# Patient Record
Sex: Female | Born: 1950
Health system: Southern US, Community
[De-identification: ages and names within clinical notes are randomized; demographics above are authoritative.]

## PROBLEM LIST (undated history)

## (undated) DIAGNOSIS — R0602 Shortness of breath: Secondary | ICD-10-CM

## (undated) DIAGNOSIS — J45909 Unspecified asthma, uncomplicated: Secondary | ICD-10-CM

## (undated) DIAGNOSIS — Z21 Asymptomatic human immunodeficiency virus [HIV] infection status: Secondary | ICD-10-CM

## (undated) DIAGNOSIS — R51 Headache: Secondary | ICD-10-CM

## (undated) DIAGNOSIS — G63 Polyneuropathy in diseases classified elsewhere: Secondary | ICD-10-CM

## (undated) DIAGNOSIS — J449 Chronic obstructive pulmonary disease, unspecified: Secondary | ICD-10-CM

## (undated) DIAGNOSIS — M797 Fibromyalgia: Secondary | ICD-10-CM

## (undated) DIAGNOSIS — F32A Depression, unspecified: Secondary | ICD-10-CM

## (undated) DIAGNOSIS — C73 Malignant neoplasm of thyroid gland: Secondary | ICD-10-CM

## (undated) DIAGNOSIS — M1990 Unspecified osteoarthritis, unspecified site: Secondary | ICD-10-CM

## (undated) DIAGNOSIS — F419 Anxiety disorder, unspecified: Secondary | ICD-10-CM

## (undated) DIAGNOSIS — J189 Pneumonia, unspecified organism: Secondary | ICD-10-CM

## (undated) DIAGNOSIS — N898 Other specified noninflammatory disorders of vagina: Secondary | ICD-10-CM

## (undated) DIAGNOSIS — F329 Major depressive disorder, single episode, unspecified: Secondary | ICD-10-CM

## (undated) DIAGNOSIS — B20 Human immunodeficiency virus [HIV] disease: Secondary | ICD-10-CM

## (undated) HISTORY — DX: Unspecified asthma, uncomplicated: J45.909

## (undated) HISTORY — PX: DILATION AND CURETTAGE OF UTERUS: SHX78

## (undated) HISTORY — DX: Other specified noninflammatory disorders of vagina: N89.8

## (undated) HISTORY — DX: Unspecified osteoarthritis, unspecified site: M19.90

## (undated) HISTORY — DX: Chronic obstructive pulmonary disease, unspecified: J44.9

## (undated) HISTORY — DX: Human immunodeficiency virus (HIV) disease: B20

## (undated) HISTORY — DX: Polyneuropathy in diseases classified elsewhere: G63

---

## 1998-03-13 ENCOUNTER — Emergency Department (HOSPITAL_COMMUNITY): Admission: EM | Admit: 1998-03-13 | Discharge: 1998-03-13 | Payer: Self-pay | Admitting: Emergency Medicine

## 1998-03-27 ENCOUNTER — Emergency Department (HOSPITAL_COMMUNITY): Admission: EM | Admit: 1998-03-27 | Discharge: 1998-03-27 | Payer: Self-pay | Admitting: Emergency Medicine

## 1998-06-29 ENCOUNTER — Encounter: Admission: RE | Admit: 1998-06-29 | Discharge: 1998-06-29 | Payer: Self-pay | Admitting: Family Medicine

## 1998-07-18 ENCOUNTER — Encounter: Admission: RE | Admit: 1998-07-18 | Discharge: 1998-07-18 | Payer: Self-pay | Admitting: Family Medicine

## 1998-09-27 ENCOUNTER — Encounter: Admission: RE | Admit: 1998-09-27 | Discharge: 1998-09-27 | Payer: Self-pay | Admitting: Family Medicine

## 1998-12-17 ENCOUNTER — Encounter: Admission: RE | Admit: 1998-12-17 | Discharge: 1998-12-17 | Payer: Self-pay | Admitting: Family Medicine

## 1999-02-22 ENCOUNTER — Encounter: Payer: Self-pay | Admitting: Emergency Medicine

## 1999-02-22 ENCOUNTER — Emergency Department (HOSPITAL_COMMUNITY): Admission: EM | Admit: 1999-02-22 | Discharge: 1999-02-22 | Payer: Self-pay | Admitting: Emergency Medicine

## 1999-08-23 ENCOUNTER — Emergency Department (HOSPITAL_COMMUNITY): Admission: EM | Admit: 1999-08-23 | Discharge: 1999-08-23 | Payer: Self-pay | Admitting: Emergency Medicine

## 1999-08-23 ENCOUNTER — Encounter: Payer: Self-pay | Admitting: Emergency Medicine

## 2000-05-19 ENCOUNTER — Emergency Department (HOSPITAL_COMMUNITY): Admission: EM | Admit: 2000-05-19 | Discharge: 2000-05-19 | Payer: Self-pay | Admitting: Emergency Medicine

## 2000-05-19 ENCOUNTER — Encounter: Payer: Self-pay | Admitting: Emergency Medicine

## 2000-10-10 ENCOUNTER — Emergency Department (HOSPITAL_COMMUNITY): Admission: EM | Admit: 2000-10-10 | Discharge: 2000-10-11 | Payer: Self-pay | Admitting: Emergency Medicine

## 2000-10-10 ENCOUNTER — Encounter: Payer: Self-pay | Admitting: Emergency Medicine

## 2000-10-14 ENCOUNTER — Encounter: Admission: RE | Admit: 2000-10-14 | Discharge: 2000-10-14 | Payer: Self-pay | Admitting: Family Medicine

## 2001-02-23 ENCOUNTER — Emergency Department (HOSPITAL_COMMUNITY): Admission: EM | Admit: 2001-02-23 | Discharge: 2001-02-23 | Payer: Self-pay | Admitting: Emergency Medicine

## 2001-02-23 ENCOUNTER — Encounter: Payer: Self-pay | Admitting: Emergency Medicine

## 2002-02-13 ENCOUNTER — Emergency Department (HOSPITAL_COMMUNITY): Admission: EM | Admit: 2002-02-13 | Discharge: 2002-02-13 | Payer: Self-pay | Admitting: Emergency Medicine

## 2002-02-13 ENCOUNTER — Encounter: Payer: Self-pay | Admitting: Emergency Medicine

## 2002-02-18 ENCOUNTER — Emergency Department (HOSPITAL_COMMUNITY): Admission: EM | Admit: 2002-02-18 | Discharge: 2002-02-18 | Payer: Self-pay

## 2002-02-23 ENCOUNTER — Encounter: Admission: RE | Admit: 2002-02-23 | Discharge: 2002-03-14 | Payer: Self-pay | Admitting: *Deleted

## 2002-11-01 ENCOUNTER — Emergency Department (HOSPITAL_COMMUNITY): Admission: EM | Admit: 2002-11-01 | Discharge: 2002-11-01 | Payer: Self-pay | Admitting: *Deleted

## 2002-11-11 ENCOUNTER — Emergency Department (HOSPITAL_COMMUNITY): Admission: EM | Admit: 2002-11-11 | Discharge: 2002-11-11 | Payer: Self-pay | Admitting: Emergency Medicine

## 2003-01-04 ENCOUNTER — Other Ambulatory Visit: Admission: RE | Admit: 2003-01-04 | Discharge: 2003-01-04 | Payer: Self-pay | Admitting: Family Medicine

## 2003-01-04 ENCOUNTER — Encounter: Admission: RE | Admit: 2003-01-04 | Discharge: 2003-01-04 | Payer: Self-pay | Admitting: Family Medicine

## 2003-01-06 ENCOUNTER — Encounter: Admission: RE | Admit: 2003-01-06 | Discharge: 2003-01-06 | Payer: Self-pay | Admitting: Family Medicine

## 2003-01-06 ENCOUNTER — Encounter: Payer: Self-pay | Admitting: Sports Medicine

## 2003-01-12 ENCOUNTER — Encounter: Payer: Self-pay | Admitting: Sports Medicine

## 2003-01-12 ENCOUNTER — Encounter: Admission: RE | Admit: 2003-01-12 | Discharge: 2003-01-12 | Payer: Self-pay | Admitting: Sports Medicine

## 2003-02-20 ENCOUNTER — Emergency Department (HOSPITAL_COMMUNITY): Admission: EM | Admit: 2003-02-20 | Discharge: 2003-02-20 | Payer: Self-pay | Admitting: *Deleted

## 2003-03-22 ENCOUNTER — Emergency Department (HOSPITAL_COMMUNITY): Admission: EM | Admit: 2003-03-22 | Discharge: 2003-03-22 | Payer: Self-pay | Admitting: Emergency Medicine

## 2003-04-16 ENCOUNTER — Emergency Department (HOSPITAL_COMMUNITY): Admission: EM | Admit: 2003-04-16 | Discharge: 2003-04-16 | Payer: Self-pay | Admitting: Emergency Medicine

## 2003-04-18 ENCOUNTER — Encounter: Admission: RE | Admit: 2003-04-18 | Discharge: 2003-04-18 | Payer: Self-pay | Admitting: Family Medicine

## 2003-05-02 ENCOUNTER — Encounter: Admission: RE | Admit: 2003-05-02 | Discharge: 2003-05-02 | Payer: Self-pay | Admitting: Family Medicine

## 2003-06-28 ENCOUNTER — Emergency Department (HOSPITAL_COMMUNITY): Admission: EM | Admit: 2003-06-28 | Discharge: 2003-06-28 | Payer: Self-pay | Admitting: Emergency Medicine

## 2003-07-14 ENCOUNTER — Emergency Department (HOSPITAL_COMMUNITY): Admission: EM | Admit: 2003-07-14 | Discharge: 2003-07-14 | Payer: Self-pay | Admitting: Emergency Medicine

## 2004-01-01 ENCOUNTER — Encounter: Admission: RE | Admit: 2004-01-01 | Discharge: 2004-01-01 | Payer: Self-pay | Admitting: Family Medicine

## 2004-01-01 ENCOUNTER — Emergency Department (HOSPITAL_COMMUNITY): Admission: EM | Admit: 2004-01-01 | Discharge: 2004-01-01 | Payer: Self-pay | Admitting: Emergency Medicine

## 2004-01-17 ENCOUNTER — Emergency Department (HOSPITAL_COMMUNITY): Admission: EM | Admit: 2004-01-17 | Discharge: 2004-01-17 | Payer: Self-pay | Admitting: *Deleted

## 2004-01-22 ENCOUNTER — Emergency Department (HOSPITAL_COMMUNITY): Admission: EM | Admit: 2004-01-22 | Discharge: 2004-01-22 | Payer: Self-pay | Admitting: Emergency Medicine

## 2004-04-29 ENCOUNTER — Other Ambulatory Visit: Admission: RE | Admit: 2004-04-29 | Discharge: 2004-04-29 | Payer: Self-pay | Admitting: Family Medicine

## 2004-04-29 ENCOUNTER — Encounter: Admission: RE | Admit: 2004-04-29 | Discharge: 2004-04-29 | Payer: Self-pay | Admitting: Family Medicine

## 2004-05-13 ENCOUNTER — Encounter: Admission: RE | Admit: 2004-05-13 | Discharge: 2004-05-13 | Payer: Self-pay | Admitting: Sports Medicine

## 2004-05-15 ENCOUNTER — Encounter: Admission: RE | Admit: 2004-05-15 | Discharge: 2004-05-15 | Payer: Self-pay | Admitting: Sports Medicine

## 2004-05-29 ENCOUNTER — Encounter: Admission: RE | Admit: 2004-05-29 | Discharge: 2004-05-29 | Payer: Self-pay | Admitting: Family Medicine

## 2004-06-03 ENCOUNTER — Emergency Department (HOSPITAL_COMMUNITY): Admission: EM | Admit: 2004-06-03 | Discharge: 2004-06-03 | Payer: Self-pay | Admitting: Emergency Medicine

## 2004-06-17 ENCOUNTER — Encounter: Admission: RE | Admit: 2004-06-17 | Discharge: 2004-06-17 | Payer: Self-pay | Admitting: Family Medicine

## 2004-07-28 ENCOUNTER — Emergency Department (HOSPITAL_COMMUNITY): Admission: EM | Admit: 2004-07-28 | Discharge: 2004-07-28 | Payer: Self-pay | Admitting: Emergency Medicine

## 2004-08-19 ENCOUNTER — Ambulatory Visit: Payer: Self-pay | Admitting: Family Medicine

## 2005-02-14 ENCOUNTER — Ambulatory Visit: Payer: Self-pay | Admitting: Family Medicine

## 2005-05-31 ENCOUNTER — Encounter (INDEPENDENT_AMBULATORY_CARE_PROVIDER_SITE_OTHER): Payer: Self-pay | Admitting: *Deleted

## 2005-06-13 ENCOUNTER — Other Ambulatory Visit: Admission: RE | Admit: 2005-06-13 | Discharge: 2005-06-13 | Payer: Self-pay | Admitting: Family Medicine

## 2005-06-13 ENCOUNTER — Ambulatory Visit: Payer: Self-pay | Admitting: Family Medicine

## 2005-08-11 ENCOUNTER — Emergency Department (HOSPITAL_COMMUNITY): Admission: EM | Admit: 2005-08-11 | Discharge: 2005-08-11 | Payer: Self-pay | Admitting: Family Medicine

## 2005-08-25 ENCOUNTER — Emergency Department (HOSPITAL_COMMUNITY): Admission: EM | Admit: 2005-08-25 | Discharge: 2005-08-26 | Payer: Self-pay | Admitting: Emergency Medicine

## 2005-09-04 ENCOUNTER — Emergency Department (HOSPITAL_COMMUNITY): Admission: EM | Admit: 2005-09-04 | Discharge: 2005-09-04 | Payer: Self-pay | Admitting: Family Medicine

## 2005-09-09 ENCOUNTER — Emergency Department (HOSPITAL_COMMUNITY): Admission: EM | Admit: 2005-09-09 | Discharge: 2005-09-09 | Payer: Self-pay | Admitting: Emergency Medicine

## 2005-09-29 ENCOUNTER — Ambulatory Visit: Payer: Self-pay | Admitting: Sports Medicine

## 2006-03-25 ENCOUNTER — Emergency Department (HOSPITAL_COMMUNITY): Admission: EM | Admit: 2006-03-25 | Discharge: 2006-03-25 | Payer: Self-pay | Admitting: Emergency Medicine

## 2006-04-10 ENCOUNTER — Emergency Department (HOSPITAL_COMMUNITY): Admission: EM | Admit: 2006-04-10 | Discharge: 2006-04-11 | Payer: Self-pay | Admitting: Emergency Medicine

## 2006-04-20 ENCOUNTER — Ambulatory Visit: Payer: Self-pay | Admitting: Family Medicine

## 2006-09-08 ENCOUNTER — Ambulatory Visit: Payer: Self-pay | Admitting: Family Medicine

## 2006-09-11 ENCOUNTER — Ambulatory Visit (HOSPITAL_COMMUNITY): Admission: RE | Admit: 2006-09-11 | Discharge: 2006-09-11 | Payer: Self-pay | Admitting: Family Medicine

## 2006-12-30 ENCOUNTER — Ambulatory Visit: Payer: Self-pay | Admitting: Family Medicine

## 2007-01-28 DIAGNOSIS — F339 Major depressive disorder, recurrent, unspecified: Secondary | ICD-10-CM | POA: Insufficient documentation

## 2007-01-28 DIAGNOSIS — J410 Simple chronic bronchitis: Secondary | ICD-10-CM

## 2007-01-28 DIAGNOSIS — G44209 Tension-type headache, unspecified, not intractable: Secondary | ICD-10-CM

## 2007-01-28 DIAGNOSIS — M76899 Other specified enthesopathies of unspecified lower limb, excluding foot: Secondary | ICD-10-CM

## 2007-01-28 DIAGNOSIS — F411 Generalized anxiety disorder: Secondary | ICD-10-CM

## 2007-01-28 DIAGNOSIS — N951 Menopausal and female climacteric states: Secondary | ICD-10-CM

## 2007-01-28 DIAGNOSIS — D179 Benign lipomatous neoplasm, unspecified: Secondary | ICD-10-CM | POA: Insufficient documentation

## 2007-01-28 DIAGNOSIS — R209 Unspecified disturbances of skin sensation: Secondary | ICD-10-CM | POA: Insufficient documentation

## 2007-01-29 ENCOUNTER — Encounter (INDEPENDENT_AMBULATORY_CARE_PROVIDER_SITE_OTHER): Payer: Self-pay | Admitting: *Deleted

## 2007-02-25 ENCOUNTER — Ambulatory Visit: Payer: Self-pay | Admitting: Family Medicine

## 2007-02-25 ENCOUNTER — Encounter (INDEPENDENT_AMBULATORY_CARE_PROVIDER_SITE_OTHER): Payer: Self-pay | Admitting: Family Medicine

## 2007-03-03 ENCOUNTER — Ambulatory Visit: Payer: Self-pay | Admitting: Family Medicine

## 2007-03-10 ENCOUNTER — Ambulatory Visit: Payer: Self-pay | Admitting: Family Medicine

## 2007-04-02 ENCOUNTER — Emergency Department (HOSPITAL_COMMUNITY): Admission: EM | Admit: 2007-04-02 | Discharge: 2007-04-02 | Payer: Self-pay | Admitting: Emergency Medicine

## 2007-04-15 ENCOUNTER — Ambulatory Visit: Payer: Self-pay | Admitting: Family Medicine

## 2007-05-21 ENCOUNTER — Emergency Department (HOSPITAL_COMMUNITY): Admission: EM | Admit: 2007-05-21 | Discharge: 2007-05-21 | Payer: Self-pay | Admitting: Emergency Medicine

## 2007-05-24 ENCOUNTER — Ambulatory Visit: Payer: Self-pay | Admitting: Family Medicine

## 2007-05-24 DIAGNOSIS — M25569 Pain in unspecified knee: Secondary | ICD-10-CM

## 2007-06-09 ENCOUNTER — Ambulatory Visit: Payer: Self-pay | Admitting: Sports Medicine

## 2007-06-09 DIAGNOSIS — M9320 Osteochondritis dissecans of unspecified site: Secondary | ICD-10-CM

## 2007-07-06 ENCOUNTER — Emergency Department (HOSPITAL_COMMUNITY): Admission: EM | Admit: 2007-07-06 | Discharge: 2007-07-06 | Payer: Self-pay | Admitting: Emergency Medicine

## 2007-07-15 ENCOUNTER — Telehealth: Payer: Self-pay | Admitting: Family Medicine

## 2007-07-21 ENCOUNTER — Ambulatory Visit: Payer: Self-pay | Admitting: Sports Medicine

## 2007-07-26 ENCOUNTER — Telehealth (INDEPENDENT_AMBULATORY_CARE_PROVIDER_SITE_OTHER): Payer: Self-pay | Admitting: *Deleted

## 2007-07-27 ENCOUNTER — Encounter (INDEPENDENT_AMBULATORY_CARE_PROVIDER_SITE_OTHER): Payer: Self-pay | Admitting: *Deleted

## 2007-09-13 ENCOUNTER — Emergency Department (HOSPITAL_COMMUNITY): Admission: EM | Admit: 2007-09-13 | Discharge: 2007-09-13 | Payer: Self-pay | Admitting: Emergency Medicine

## 2007-09-30 ENCOUNTER — Emergency Department (HOSPITAL_COMMUNITY): Admission: EM | Admit: 2007-09-30 | Discharge: 2007-09-30 | Payer: Self-pay | Admitting: Emergency Medicine

## 2007-10-01 ENCOUNTER — Ambulatory Visit: Payer: Self-pay | Admitting: Sports Medicine

## 2007-10-01 DIAGNOSIS — M2340 Loose body in knee, unspecified knee: Secondary | ICD-10-CM

## 2007-10-01 DIAGNOSIS — S93409A Sprain of unspecified ligament of unspecified ankle, initial encounter: Secondary | ICD-10-CM | POA: Insufficient documentation

## 2007-10-01 DIAGNOSIS — S92919A Unspecified fracture of unspecified toe(s), initial encounter for closed fracture: Secondary | ICD-10-CM | POA: Insufficient documentation

## 2007-10-06 ENCOUNTER — Telehealth: Payer: Self-pay | Admitting: Sports Medicine

## 2007-10-22 ENCOUNTER — Telehealth (INDEPENDENT_AMBULATORY_CARE_PROVIDER_SITE_OTHER): Payer: Self-pay | Admitting: *Deleted

## 2007-12-07 ENCOUNTER — Telehealth: Payer: Self-pay | Admitting: *Deleted

## 2007-12-09 ENCOUNTER — Ambulatory Visit: Payer: Self-pay | Admitting: Family Medicine

## 2007-12-15 ENCOUNTER — Ambulatory Visit: Payer: Self-pay | Admitting: Sports Medicine

## 2007-12-27 ENCOUNTER — Emergency Department (HOSPITAL_COMMUNITY): Admission: EM | Admit: 2007-12-27 | Discharge: 2007-12-27 | Payer: Self-pay | Admitting: Emergency Medicine

## 2008-04-26 ENCOUNTER — Emergency Department (HOSPITAL_COMMUNITY): Admission: EM | Admit: 2008-04-26 | Discharge: 2008-04-26 | Payer: Self-pay | Admitting: Emergency Medicine

## 2008-06-27 ENCOUNTER — Emergency Department (HOSPITAL_COMMUNITY): Admission: EM | Admit: 2008-06-27 | Discharge: 2008-06-27 | Payer: Self-pay | Admitting: Emergency Medicine

## 2008-08-18 ENCOUNTER — Emergency Department (HOSPITAL_COMMUNITY): Admission: EM | Admit: 2008-08-18 | Discharge: 2008-08-18 | Payer: Self-pay | Admitting: Emergency Medicine

## 2008-11-03 ENCOUNTER — Emergency Department (HOSPITAL_COMMUNITY): Admission: EM | Admit: 2008-11-03 | Discharge: 2008-11-03 | Payer: Self-pay | Admitting: Emergency Medicine

## 2008-12-13 ENCOUNTER — Emergency Department (HOSPITAL_COMMUNITY): Admission: EM | Admit: 2008-12-13 | Discharge: 2008-12-13 | Payer: Self-pay | Admitting: Emergency Medicine

## 2009-03-01 DEATH — deceased

## 2009-05-04 ENCOUNTER — Emergency Department (HOSPITAL_COMMUNITY): Admission: EM | Admit: 2009-05-04 | Discharge: 2009-05-04 | Payer: Self-pay | Admitting: Family Medicine

## 2009-05-31 HISTORY — PX: ANTERIOR CERVICAL DECOMP/DISCECTOMY FUSION: SHX1161

## 2009-06-12 ENCOUNTER — Emergency Department (HOSPITAL_COMMUNITY): Admission: EM | Admit: 2009-06-12 | Discharge: 2009-06-12 | Payer: Self-pay | Admitting: Emergency Medicine

## 2009-06-18 ENCOUNTER — Inpatient Hospital Stay (HOSPITAL_COMMUNITY): Admission: RE | Admit: 2009-06-18 | Discharge: 2009-06-19 | Payer: Self-pay | Admitting: Neurosurgery

## 2009-09-14 ENCOUNTER — Emergency Department (HOSPITAL_COMMUNITY): Admission: EM | Admit: 2009-09-14 | Discharge: 2009-09-14 | Payer: Self-pay | Admitting: Emergency Medicine

## 2009-12-02 ENCOUNTER — Emergency Department (HOSPITAL_COMMUNITY): Admission: EM | Admit: 2009-12-02 | Discharge: 2009-12-02 | Payer: Self-pay | Admitting: Emergency Medicine

## 2010-05-05 ENCOUNTER — Encounter: Admission: RE | Admit: 2010-05-05 | Discharge: 2010-05-05 | Payer: Self-pay | Admitting: Neurosurgery

## 2010-10-21 ENCOUNTER — Emergency Department (HOSPITAL_COMMUNITY): Admission: EM | Admit: 2010-10-21 | Discharge: 2010-10-21 | Payer: Self-pay | Admitting: Emergency Medicine

## 2010-10-31 ENCOUNTER — Emergency Department (HOSPITAL_COMMUNITY)
Admission: EM | Admit: 2010-10-31 | Discharge: 2010-10-31 | Payer: Self-pay | Source: Home / Self Care | Admitting: Emergency Medicine

## 2011-01-04 ENCOUNTER — Encounter: Payer: Self-pay | Admitting: *Deleted

## 2011-02-11 LAB — URINE MICROSCOPIC-ADD ON

## 2011-02-11 LAB — WET PREP, GENITAL: Clue Cells Wet Prep HPF POC: NONE SEEN

## 2011-02-11 LAB — URINALYSIS, ROUTINE W REFLEX MICROSCOPIC
Glucose, UA: NEGATIVE mg/dL
Hgb urine dipstick: NEGATIVE
Specific Gravity, Urine: 1.03 (ref 1.005–1.030)

## 2011-02-15 LAB — WET PREP, GENITAL: Yeast Wet Prep HPF POC: NONE SEEN

## 2011-02-15 LAB — URINE MICROSCOPIC-ADD ON

## 2011-02-15 LAB — URINALYSIS, ROUTINE W REFLEX MICROSCOPIC
Nitrite: NEGATIVE
Specific Gravity, Urine: 1.03 (ref 1.005–1.030)
Urobilinogen, UA: 1 mg/dL (ref 0.0–1.0)

## 2011-03-06 LAB — URINE MICROSCOPIC-ADD ON

## 2011-03-06 LAB — URINALYSIS, ROUTINE W REFLEX MICROSCOPIC
Glucose, UA: NEGATIVE mg/dL
Ketones, ur: NEGATIVE mg/dL
Protein, ur: 100 mg/dL — AB

## 2011-03-09 LAB — CBC
HCT: 41.2 % (ref 36.0–46.0)
Hemoglobin: 14.3 g/dL (ref 12.0–15.0)
RBC: 4.55 MIL/uL (ref 3.87–5.11)
RDW: 13.1 % (ref 11.5–15.5)
WBC: 6.7 10*3/uL (ref 4.0–10.5)

## 2011-03-09 LAB — URINALYSIS, ROUTINE W REFLEX MICROSCOPIC
Bilirubin Urine: NEGATIVE
Glucose, UA: NEGATIVE mg/dL
Specific Gravity, Urine: 1.011 (ref 1.005–1.030)
pH: 7.5 (ref 5.0–8.0)

## 2011-03-09 LAB — URINE MICROSCOPIC-ADD ON

## 2011-03-17 ENCOUNTER — Emergency Department (HOSPITAL_COMMUNITY)
Admission: EM | Admit: 2011-03-17 | Discharge: 2011-03-17 | Disposition: A | Discharge status: Home / Self Care | Payer: Medicaid Other | Attending: Emergency Medicine | Admitting: Emergency Medicine

## 2011-03-17 DIAGNOSIS — J4489 Other specified chronic obstructive pulmonary disease: Secondary | ICD-10-CM | POA: Insufficient documentation

## 2011-03-17 DIAGNOSIS — L2989 Other pruritus: Secondary | ICD-10-CM | POA: Insufficient documentation

## 2011-03-17 DIAGNOSIS — M79609 Pain in unspecified limb: Secondary | ICD-10-CM | POA: Insufficient documentation

## 2011-03-17 DIAGNOSIS — L298 Other pruritus: Secondary | ICD-10-CM | POA: Insufficient documentation

## 2011-03-17 DIAGNOSIS — T370X5A Adverse effect of sulfonamides, initial encounter: Secondary | ICD-10-CM | POA: Insufficient documentation

## 2011-03-17 DIAGNOSIS — J438 Other emphysema: Secondary | ICD-10-CM | POA: Insufficient documentation

## 2011-03-17 DIAGNOSIS — J449 Chronic obstructive pulmonary disease, unspecified: Secondary | ICD-10-CM | POA: Insufficient documentation

## 2011-04-15 NOTE — Op Note (Signed)
[name], [name]                ACCOUNT NO.:  [id number]   MEDICAL RECORD NO.:  [id number]          PATIENT TYPE:  INP   LOCATION:  3524                         FACILITY:  [facility]   PHYSICIAN:  [name], M.D.  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   PROCEDURE:  Anterior cervical decompression diskectomy fusion at C3-4  and C5-6 with LifeNet allograft bone and Trestle anterior cervical plate  x2.   SURGEON:  Rachael Fake, MD   ASSISTANT:  Rachael Lias, MD   ANESTHESIA:  General endotracheal tube anesthesia.   ESTIMATED BLOOD LOSS:  Minimal.   BLOOD GIVEN:  None.   DRAINS:  None.   COMPLICATIONS:  None.   INDICATIONS FOR PROCEDURE:  The patient is a 60 year old woman who has  leg clumsiness, tingling, and pain.  MRI of cervical spine was done  showing cord compression and cord change due to stenosis and disk  herniation at C3-4 and some spondylitic change and stenosis also at C5-  6, but no cord change there.  The patient was brought in for  decompression and fusion.   PROCEDURE IN DETAIL:  The patient was brought to the operating room and  general anesthesia was induced.  The patient was placed in 10-pound  Halter traction, prepped and draped in sterile fashion.  Site of  incision injected with 10 mL of 1% lidocaine with epinephrine.  Incision  was then made from the midline to the anterior border of the  sternocleidomastoid muscle on the left side.  Neck incision taken down  platysma and hemostasis obtained with Bovie cauterization.  The platysma  muscle was incised with a Bovie and blunt dissection taken through the  anterior cervical fascia at the anterior cervical spine.  Needle was  placed in 2 interspaces.  X-ray was obtained showing the needles were at  the C4, 5, 6, 7 levels.  We then removed the needles and exposed.  We  went higher and placed the needles at 3-4 and 5-6 levels.  Took another  x-ray and this confirmed our positioning.  At the 3-4 level, the needle  was removed.  Disk space was incised with a 15 blade and partial  diskectomy was started with pituitary rongeurs.  We then placed self-  retaining retractor.  We placed distraction pins in the C3 and C4 and  distracted the interspace.  Microscope was brought in for  microdissection.  We continued diskectomy with curettes and then 1 or 2-  mm Kerrison punches were removed in posterior disk osteophytes ligament  decompressing the central canal and performing bilateral foraminotomies.  When we were finished, we had good decompression of central canal and  bilateral nerve roots exited well.  We used high-speed drill to remove  cartilaginous endplate.  Used sizer to measure height  of disk space,  measured to be 5 mm and good hemostasis with Gelfoam and thrombin.  This  was irrigated out and then a 5-mm LifeNet allograft bone was tapped into  place, countersunk couple millimeters.  Distraction pins were removed.  Hemostasis obtained with Gelfoam and thrombin.  A Trestle anterior  cervical plate was placed over the 3-4 disk space and 2 screws were  placed at 3-4 and these were tightened down.  Retractor was then moved  down to the 5-6 level.  Disk space was incised with 15-blade and  diskectomy was started with pituitary rongeurs.  Anterior osteophytes  removed with Leksell rongeurs and distraction pins placed in the C5 and  6 and the interspace distracted.  Microscope was moved down, so I could  look at the 5-6 disk and diskectomy continued with curettes and 1 or 2-  mm Kerrison punch was removed from posterior disk osteophyte ligament   decompressing the central canal and performing bilateral foraminotomies.  When we were finished, we had good central and lateral decompression.  We used high-speed drill to remove cartilaginous endplate.  We measured  height of the disk space to be 5 mm.  A 5-mm LifeNet allograft bone was  tapped into place.  Distraction pins were removed, weight was removed  from the traction, bone plug was firmly in good position, and a Trestle  anterior cervical plate was placed over the anterior cervical spine, 2  screws were placed at C5-C6.  These were tightened down, lateral x-rays  were obtained showing good position of plate, screws, bone plugs at 3-4  and 5-6 levels.  Retractor was removed.  Hemostasis was obtained with  bipolar cauterization, Gelfoam, and thrombin.  Gelfoam was irrigated  out.  We had good hemostasis.  The platysma was closed with 3-0 Vicryl  interrupted sutures.  Subcutaneous tissue closed with same.  Skin closed  with benzoin and Steri-Strips.  Dressing was placed.  The patient was  placed in a soft cervical collar, awoken from anesthesia, and  transferred to recovery room in stable condition.           ______________________________  [name], M.D.     JRH/MEDQ  D:  06/18/2009  T:  06/19/2009  Job:  [id number]

## 2011-05-26 ENCOUNTER — Emergency Department (HOSPITAL_COMMUNITY)
Admission: EM | Admit: 2011-05-26 | Discharge: 2011-05-26 | Disposition: A | Discharge status: Home / Self Care | Payer: Medicaid Other | Attending: Emergency Medicine | Admitting: Emergency Medicine

## 2011-05-26 ENCOUNTER — Emergency Department (HOSPITAL_COMMUNITY): Payer: Medicaid Other

## 2011-05-26 DIAGNOSIS — IMO0002 Reserved for concepts with insufficient information to code with codable children: Secondary | ICD-10-CM | POA: Insufficient documentation

## 2011-05-26 DIAGNOSIS — S61409A Unspecified open wound of unspecified hand, initial encounter: Secondary | ICD-10-CM | POA: Insufficient documentation

## 2011-05-26 DIAGNOSIS — J45909 Unspecified asthma, uncomplicated: Secondary | ICD-10-CM | POA: Insufficient documentation

## 2011-05-26 DIAGNOSIS — T148XXA Other injury of unspecified body region, initial encounter: Secondary | ICD-10-CM | POA: Insufficient documentation

## 2011-05-26 DIAGNOSIS — M25559 Pain in unspecified hip: Secondary | ICD-10-CM | POA: Insufficient documentation

## 2011-05-26 DIAGNOSIS — M25529 Pain in unspecified elbow: Secondary | ICD-10-CM | POA: Insufficient documentation

## 2011-05-26 DIAGNOSIS — M79609 Pain in unspecified limb: Secondary | ICD-10-CM | POA: Insufficient documentation

## 2011-05-26 DIAGNOSIS — Z7982 Long term (current) use of aspirin: Secondary | ICD-10-CM | POA: Insufficient documentation

## 2011-05-26 DIAGNOSIS — M25569 Pain in unspecified knee: Secondary | ICD-10-CM | POA: Insufficient documentation

## 2011-08-22 ENCOUNTER — Ambulatory Visit (HOSPITAL_COMMUNITY)
Admission: RE | Admit: 2011-08-22 | Discharge: 2011-08-22 | Disposition: A | Discharge status: Home / Self Care | Payer: Medicaid Other | Source: Ambulatory Visit | Attending: Family Medicine | Admitting: Family Medicine

## 2011-08-22 ENCOUNTER — Other Ambulatory Visit (HOSPITAL_COMMUNITY): Payer: Self-pay | Admitting: Family Medicine

## 2011-08-22 DIAGNOSIS — R52 Pain, unspecified: Secondary | ICD-10-CM

## 2011-08-22 DIAGNOSIS — M4802 Spinal stenosis, cervical region: Secondary | ICD-10-CM | POA: Insufficient documentation

## 2011-08-22 DIAGNOSIS — M542 Cervicalgia: Secondary | ICD-10-CM | POA: Insufficient documentation

## 2011-08-23 ENCOUNTER — Inpatient Hospital Stay (INDEPENDENT_AMBULATORY_CARE_PROVIDER_SITE_OTHER)
Admission: RE | Admit: 2011-08-23 | Discharge: 2011-08-23 | Disposition: A | Discharge status: Home / Self Care | Payer: Medicaid Other | Source: Ambulatory Visit | Attending: Family Medicine | Admitting: Family Medicine

## 2011-08-23 DIAGNOSIS — L299 Pruritus, unspecified: Secondary | ICD-10-CM

## 2011-08-23 DIAGNOSIS — M542 Cervicalgia: Secondary | ICD-10-CM

## 2011-08-23 LAB — POCT I-STAT, CHEM 8
Calcium, Ion: 1.16 mmol/L (ref 1.12–1.32)
Chloride: 102 mEq/L (ref 96–112)
HCT: 41 % (ref 36.0–46.0)
Potassium: 3.8 mEq/L (ref 3.5–5.1)
Sodium: 139 mEq/L (ref 135–145)

## 2011-08-27 LAB — CULTURE, ROUTINE-ABSCESS
Culture: NO GROWTH
Gram Stain: NONE SEEN

## 2011-09-01 LAB — URINE MICROSCOPIC-ADD ON

## 2011-09-01 LAB — URINALYSIS, ROUTINE W REFLEX MICROSCOPIC
Bilirubin Urine: NEGATIVE
Hgb urine dipstick: NEGATIVE
Specific Gravity, Urine: 1.009
Urobilinogen, UA: 1

## 2011-12-15 ENCOUNTER — Encounter (INDEPENDENT_AMBULATORY_CARE_PROVIDER_SITE_OTHER): Payer: Self-pay | Admitting: Surgery

## 2011-12-15 ENCOUNTER — Ambulatory Visit (INDEPENDENT_AMBULATORY_CARE_PROVIDER_SITE_OTHER): Payer: Medicaid Other | Admitting: Surgery

## 2011-12-15 VITALS — BP 98/68 | HR 76 | Temp 97.0°F | Resp 20 | Ht 68.5 in | Wt 151.6 lb

## 2011-12-15 DIAGNOSIS — R591 Generalized enlarged lymph nodes: Secondary | ICD-10-CM

## 2011-12-15 DIAGNOSIS — R599 Enlarged lymph nodes, unspecified: Secondary | ICD-10-CM

## 2011-12-15 LAB — LACTATE DEHYDROGENASE: LDH: 183 U/L (ref 94–250)

## 2011-12-15 NOTE — Patient Instructions (Signed)
Lymphadenopathy Lymphadenopathy means "disease of the lymph glands." But the term is usually used to describe swollen or enlarged lymph glands, also called lymph nodes. These are the bean-shaped organs found in many locations including the neck, underarm, and groin. Lymph glands are part of the immune system, which fights infections in your body. Lymphadenopathy can occur in just one area of the body, such as the neck, or it can be generalized, with lymph node enlargement in several areas. The nodes found in the neck are the most common sites of lymphadenopathy. CAUSES  When your immune system responds to germs (such as viruses or bacteria ), infection-fighting cells and fluid build up. This causes the glands to grow in size. This is usually not something to worry about. Sometimes, the glands themselves can become infected and inflamed. This is called lymphadenitis. Enlarged lymph nodes can be caused by many diseases:  Bacterial disease, such as strep throat or a skin infection.   Viral disease, such as a common cold.   Other germs, such as lyme disease, tuberculosis, or sexually transmitted diseases.   Cancers, such as lymphoma (cancer of the lymphatic system) or leukemia (cancer of the white blood cells).   Inflammatory diseases such as lupus or rheumatoid arthritis.   Reactions to medications.  Many of the diseases above are rare, but important. This is why you should see your caregiver if you have lymphadenopathy. SYMPTOMS   Swollen, enlarged lumps in the neck, back of the head or other locations.   Tenderness.   Warmth or redness of the skin over the lymph nodes.   Fever.  DIAGNOSIS  Enlarged lymph nodes are often near the source of infection. They can help healthcare providers diagnose your illness. For instance:   Swollen lymph nodes around the jaw might be caused by an infection in the mouth.   Enlarged glands in the neck often signal a throat infection.   Lymph nodes that  are swollen in more than one area often indicate an illness caused by a virus.  Your caregiver most likely will know what is causing your lymphadenopathy after listening to your history and examining you. Blood tests, x-rays or other tests may be needed. If the cause of the enlarged lymph node cannot be found, and it does not go away by itself, then a biopsy may be needed. Your caregiver will discuss this with you. TREATMENT  Treatment for your enlarged lymph nodes will depend on the cause. Many times the nodes will shrink to normal size by themselves, with no treatment. Antibiotics or other medicines may be needed for infection. Only take over-the-counter or prescription medicines for pain, discomfort or fever as directed by your caregiver. HOME CARE INSTRUCTIONS  Swollen lymph glands usually return to normal when the underlying medical condition goes away. If they persist, contact your health-care provider. He/she might prescribe antibiotics or other treatments, depending on the diagnosis. Take any medications exactly as prescribed. Keep any follow-up appointments made to check on the condition of your enlarged nodes.  SEEK MEDICAL CARE IF:   Swelling lasts for more than two weeks.   You have symptoms such as weight loss, night sweats, fatigue or fever that does not go away.   The lymph nodes are hard, seem fixed to the skin or are growing rapidly.   Skin over the lymph nodes is red and inflamed. This could mean there is an infection.  SEEK IMMEDIATE MEDICAL CARE IF:   Fluid starts leaking from the area of the   enlarged lymph node.   You develop a fever of 102 F (38.9 C) or greater.   Severe pain develops (not necessarily at the site of a large lymph node).   You develop chest pain or shortness of breath.   You develop worsening abdominal pain.  MAKE SURE YOU:   Understand these instructions.   Will watch your condition.   Will get help right away if you are not doing well or get  worse.  Document Released: 08/26/2008 Document Revised: 07/30/2011 Document Reviewed: 08/26/2008 Exit[employer] Patient Information 2012 [employer]. 

## 2011-12-15 NOTE — Progress Notes (Signed)
Patient ID: [name], female   DOB: Sep 27, 1951, 61 y.o.   MRN: [id number]  Chief Complaint  Patient presents with  . Other    new pt- eval lipoma on neck and wrist    HPI [name] is a 61 y.o. female.   HPIThe patient presents at the request of Dr. Katrinka Blazing do to nodules around her neck. These were detected on physical exam recently. She is uncertain how long they have been there. Patient does not relate any significant pain from these areas. She had spinal fusion surgery 2 years ago. She does have some numbness of both hands from this. She is a smoker. She denies any IV drug use or known exposure for HIV or hepatitis. No significant weight loss. The nodules are located in her neck region and/or enlarging  Past Medical History  Diagnosis Date  . Arthritis   . COPD (chronic obstructive pulmonary disease)   . Lipoma     Past Surgical History  Procedure Date  . Spine surgery 2010    Family History  Problem Relation Age of Onset  . Heart disease Maternal Uncle     Social History History  Substance Use Topics  . Smoking status: Current Everyday Smoker -- 0.5 packs/day  . Smokeless tobacco: Not on file  . Alcohol Use: No    Allergies  Allergen Reactions  . Sulfa Antibiotics     Current Outpatient Prescriptions  Medication Sig Dispense Refill  . DULoxetine (CYMBALTA) 60 MG capsule Take 60 mg by mouth daily.      Marland Kitchen oxyCODONE-acetaminophen (PERCOCET) 10-325 MG per tablet Take 1 tablet by mouth every 4 (four) hours as needed.        Review of Systems Review of Systems  Constitutional: Positive for fatigue. Negative for unexpected weight change.  HENT: Positive for neck stiffness.   Eyes: Negative.   Respiratory: Positive for cough.   Cardiovascular: Negative.   Gastrointestinal: Negative.   Genitourinary: Negative.   Neurological: Negative.   Hematological: Negative.   Psychiatric/Behavioral: Negative.     Blood pressure 98/68, pulse 76, temperature 97 F  (36.1 C), temperature source Temporal, resp. rate 20, height 5' 8.5" (1.74 m), weight 151 lb 9.6 oz (68.765 kg).  Physical Exam Physical Exam  Constitutional: She is oriented to person, place, and time. She appears well-developed and well-nourished.  HENT:  Head: Normocephalic and atraumatic.  Eyes: EOM are normal. Pupils are equal, round, and reactive to light. No scleral icterus.  Neck: No tracheal deviation present. No thyromegaly present.    Cardiovascular: Normal rate, regular rhythm and intact distal pulses.   Pulmonary/Chest: No accessory muscle usage or stridor. Not tachypneic. She is in respiratory distress.  Abdominal: There is hepatosplenomegaly and splenomegaly. There is no rebound and no CVA tenderness.  Musculoskeletal: Normal range of motion.       Arms: Lymphadenopathy:    She has cervical adenopathy.       Right cervical: Deep cervical and posterior cervical adenopathy present.       Left cervical: Deep cervical and posterior cervical adenopathy present.    She has axillary adenopathy.       Left axillary: Lateral adenopathy present.       Right: No inguinal adenopathy present.       Left: No inguinal adenopathy present.  Neurological: She is alert and oriented to person, place, and time.  Skin: Skin is warm, dry and intact.  Psychiatric: She has a normal mood and affect. Her speech  is normal and behavior is normal.    Data Reviewed Office note  Assessment    Cervical and left axillary adenopathy with hepatosplenomegaly  Right wrist ganglion cyst    Plan    CT scan of the neck chest and abdomen for lymphadenopathy and history of tobacco abuse.  Orthopedic consult for ganglion cyst       Nakia Koble A. 12/15/2011, 10:41 AM

## 2011-12-18 ENCOUNTER — Other Ambulatory Visit (INDEPENDENT_AMBULATORY_CARE_PROVIDER_SITE_OTHER): Payer: Self-pay | Admitting: General Surgery

## 2011-12-18 DIAGNOSIS — R591 Generalized enlarged lymph nodes: Secondary | ICD-10-CM

## 2011-12-19 ENCOUNTER — Ambulatory Visit
Admission: RE | Admit: 2011-12-19 | Discharge: 2011-12-19 | Disposition: A | Discharge status: Home / Self Care | Payer: Medicaid Other | Source: Ambulatory Visit | Attending: Surgery | Admitting: Surgery

## 2011-12-19 ENCOUNTER — Other Ambulatory Visit (INDEPENDENT_AMBULATORY_CARE_PROVIDER_SITE_OTHER): Payer: Self-pay | Admitting: Surgery

## 2011-12-19 ENCOUNTER — Other Ambulatory Visit: Payer: Medicaid Other

## 2011-12-19 DIAGNOSIS — R591 Generalized enlarged lymph nodes: Secondary | ICD-10-CM

## 2011-12-22 ENCOUNTER — Other Ambulatory Visit (INDEPENDENT_AMBULATORY_CARE_PROVIDER_SITE_OTHER): Payer: Self-pay | Admitting: General Surgery

## 2011-12-22 DIAGNOSIS — E041 Nontoxic single thyroid nodule: Secondary | ICD-10-CM

## 2011-12-23 ENCOUNTER — Other Ambulatory Visit (INDEPENDENT_AMBULATORY_CARE_PROVIDER_SITE_OTHER): Payer: Self-pay | Admitting: Surgery

## 2011-12-23 ENCOUNTER — Telehealth (INDEPENDENT_AMBULATORY_CARE_PROVIDER_SITE_OTHER): Payer: Self-pay | Admitting: General Surgery

## 2011-12-23 ENCOUNTER — Other Ambulatory Visit (INDEPENDENT_AMBULATORY_CARE_PROVIDER_SITE_OTHER): Payer: Self-pay | Admitting: General Surgery

## 2011-12-23 DIAGNOSIS — E041 Nontoxic single thyroid nodule: Secondary | ICD-10-CM

## 2011-12-23 NOTE — Telephone Encounter (Signed)
MS Malmquist CALLED TO FIND OUT WHAT THE PROCEDURE WAS FOR THE FNA GUIDED THYROID BX/ I EXPLAINED THE CT RESULTS THAT SHOWED A NODULE IN THYROID AND THE NEXT STEP WAS TO HAVE THE NODULE BIOPSIED. THE RESULTS MIGHT HELP FIND OUT WHAT IS CAUSING HER LYMPH NODES TO BE ENLARGED. SHE WILL PROCEED WITH THE SCHEDULED BX/GY

## 2011-12-30 ENCOUNTER — Ambulatory Visit
Admission: RE | Admit: 2011-12-30 | Discharge: 2011-12-30 | Disposition: A | Discharge status: Home / Self Care | Payer: Medicaid Other | Source: Ambulatory Visit | Attending: Surgery | Admitting: Surgery

## 2011-12-30 ENCOUNTER — Other Ambulatory Visit (HOSPITAL_COMMUNITY)
Admission: RE | Admit: 2011-12-30 | Discharge: 2011-12-30 | Disposition: A | Discharge status: Home / Self Care | Payer: Medicaid Other | Source: Ambulatory Visit | Attending: Interventional Radiology | Admitting: Interventional Radiology

## 2011-12-30 DIAGNOSIS — E041 Nontoxic single thyroid nodule: Secondary | ICD-10-CM | POA: Insufficient documentation

## 2011-12-30 DIAGNOSIS — R6889 Other general symptoms and signs: Secondary | ICD-10-CM | POA: Insufficient documentation

## 2012-01-05 ENCOUNTER — Encounter (INDEPENDENT_AMBULATORY_CARE_PROVIDER_SITE_OTHER): Payer: Medicaid Other | Admitting: Surgery

## 2012-01-05 ENCOUNTER — Other Ambulatory Visit (INDEPENDENT_AMBULATORY_CARE_PROVIDER_SITE_OTHER): Payer: Self-pay | Admitting: General Surgery

## 2012-01-05 DIAGNOSIS — C73 Malignant neoplasm of thyroid gland: Secondary | ICD-10-CM

## 2012-01-16 ENCOUNTER — Other Ambulatory Visit: Payer: Self-pay | Admitting: Otolaryngology

## 2012-01-16 ENCOUNTER — Other Ambulatory Visit (HOSPITAL_COMMUNITY)
Admission: RE | Admit: 2012-01-16 | Discharge: 2012-01-16 | Disposition: A | Discharge status: Home / Self Care | Payer: Medicaid Other | Source: Ambulatory Visit | Attending: Otolaryngology | Admitting: Otolaryngology

## 2012-01-16 DIAGNOSIS — R221 Localized swelling, mass and lump, neck: Secondary | ICD-10-CM | POA: Insufficient documentation

## 2012-01-16 DIAGNOSIS — R220 Localized swelling, mass and lump, head: Secondary | ICD-10-CM | POA: Insufficient documentation

## 2012-01-22 ENCOUNTER — Encounter (HOSPITAL_COMMUNITY): Payer: Self-pay | Admitting: Pharmacy Technician

## 2012-01-27 ENCOUNTER — Other Ambulatory Visit (HOSPITAL_COMMUNITY): Payer: Self-pay | Admitting: Otolaryngology

## 2012-01-28 ENCOUNTER — Inpatient Hospital Stay (HOSPITAL_COMMUNITY): Admission: RE | Admit: 2012-01-28 | Discharge: 2012-01-28 | Payer: Medicaid Other | Source: Ambulatory Visit

## 2012-01-30 ENCOUNTER — Encounter (HOSPITAL_COMMUNITY): Admission: RE | Payer: Self-pay | Source: Ambulatory Visit

## 2012-01-30 ENCOUNTER — Ambulatory Visit (HOSPITAL_COMMUNITY): Admission: RE | Admit: 2012-01-30 | Payer: Medicaid Other | Source: Ambulatory Visit | Admitting: Otolaryngology

## 2012-01-30 SURGERY — THYROIDECTOMY
Anesthesia: General | Laterality: Bilateral

## 2012-02-05 ENCOUNTER — Ambulatory Visit (INDEPENDENT_AMBULATORY_CARE_PROVIDER_SITE_OTHER): Payer: Medicaid Other

## 2012-02-05 DIAGNOSIS — G622 Polyneuropathy due to other toxic agents: Secondary | ICD-10-CM

## 2012-02-05 DIAGNOSIS — B20 Human immunodeficiency virus [HIV] disease: Secondary | ICD-10-CM

## 2012-02-05 DIAGNOSIS — Z21 Asymptomatic human immunodeficiency virus [HIV] infection status: Secondary | ICD-10-CM

## 2012-02-05 DIAGNOSIS — Z23 Encounter for immunization: Secondary | ICD-10-CM

## 2012-02-05 DIAGNOSIS — C73 Malignant neoplasm of thyroid gland: Secondary | ICD-10-CM

## 2012-02-05 LAB — COMPLETE METABOLIC PANEL WITH GFR
Alkaline Phosphatase: 66 U/L (ref 39–117)
Creat: 1.13 mg/dL — ABNORMAL HIGH (ref 0.50–1.10)
GFR, Est Non African American: 53 mL/min — ABNORMAL LOW
Glucose, Bld: 89 mg/dL (ref 70–99)
Sodium: 138 mEq/L (ref 135–145)
Total Bilirubin: 0.3 mg/dL (ref 0.3–1.2)
Total Protein: 7.1 g/dL (ref 6.0–8.3)

## 2012-02-05 LAB — HEPATITIS B SURFACE ANTIGEN: Hepatitis B Surface Ag: NEGATIVE

## 2012-02-05 LAB — HEPATITIS B SURFACE ANTIBODY,QUALITATIVE: Hep B S Ab: POSITIVE — AB

## 2012-02-06 DIAGNOSIS — C73 Malignant neoplasm of thyroid gland: Secondary | ICD-10-CM | POA: Insufficient documentation

## 2012-02-06 DIAGNOSIS — B20 Human immunodeficiency virus [HIV] disease: Secondary | ICD-10-CM | POA: Insufficient documentation

## 2012-02-06 HISTORY — DX: Malignant neoplasm of thyroid gland: C73

## 2012-02-06 LAB — LIPID PANEL
HDL: 30 mg/dL — ABNORMAL LOW (ref >39)
LDL Cholesterol: 68 mg/dL (ref 0–99)
Total CHOL/HDL Ratio: 5.5 Ratio
VLDL: 66 mg/dL — ABNORMAL HIGH (ref 0–40)

## 2012-02-06 LAB — CBC WITH DIFFERENTIAL/PLATELET
Basophils Relative: 2 % — ABNORMAL HIGH (ref 0–1)
Eosinophils Absolute: 0.1 10*3/uL (ref 0.0–0.7)
Hemoglobin: 12.3 g/dL (ref 12.0–15.0)
MCH: 28.4 pg (ref 26.0–34.0)
MCHC: 32.6 g/dL (ref 30.0–36.0)
Monocytes Relative: 14 % — ABNORMAL HIGH (ref 3–12)
Neutrophils Relative %: 43 % (ref 43–77)
Platelets: 232 10*3/uL (ref 150–400)

## 2012-02-06 LAB — RPR

## 2012-02-06 LAB — URINALYSIS
Bilirubin Urine: NEGATIVE
Glucose, UA: NEGATIVE mg/dL
Hgb urine dipstick: NEGATIVE
Ketones, ur: NEGATIVE mg/dL
Protein, ur: NEGATIVE mg/dL

## 2012-02-06 LAB — T-HELPER CELL (CD4) - (RCID CLINIC ONLY): CD4 % Helper T Cell: 32 % — ABNORMAL LOW (ref 33–55)

## 2012-02-06 LAB — HEPATITIS B CORE ANTIBODY, TOTAL: Hep B Core Total Ab: POSITIVE — AB
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
## 2012-02-06 NOTE — Patient Instructions (Signed)
Pt is to keep office visit scheduled with Dr Daiva Eves on 02-19-12.   Advised to [facility] [place]
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
## 2012-02-09 DIAGNOSIS — Z23 Encounter for immunization: Secondary | ICD-10-CM

## 2012-02-09 LAB — HIV-1 RNA ULTRAQUANT REFLEX TO GENTYP+
HIV 1 RNA Quant: 359658 copies/mL — ABNORMAL HIGH (ref <20)
HIV-1 RNA Quant, Log: 5.56 {Log} — ABNORMAL HIGH (ref <1.30)

## 2012-02-12 LAB — HIV-1 GENOTYPR PLUS
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
## 2012-02-16 ENCOUNTER — Other Ambulatory Visit: Payer: Self-pay | Admitting: Family Medicine

## 2012-02-19 ENCOUNTER — Encounter: Payer: Self-pay | Admitting: Infectious Disease

## 2012-02-19 ENCOUNTER — Ambulatory Visit (INDEPENDENT_AMBULATORY_CARE_PROVIDER_SITE_OTHER): Payer: Medicaid Other | Admitting: Infectious Disease

## 2012-02-19 VITALS — BP 127/84 | HR 75 | Temp 98.2°F | Ht 68.0 in | Wt 152.0 lb

## 2012-02-19 DIAGNOSIS — Z21 Asymptomatic human immunodeficiency virus [HIV] infection status: Secondary | ICD-10-CM

## 2012-02-19 DIAGNOSIS — F339 Major depressive disorder, recurrent, unspecified: Secondary | ICD-10-CM

## 2012-02-19 DIAGNOSIS — B20 Human immunodeficiency virus [HIV] disease: Secondary | ICD-10-CM

## 2012-02-19 DIAGNOSIS — C73 Malignant neoplasm of thyroid gland: Secondary | ICD-10-CM

## 2012-02-19 MED ORDER — ATAZANAVIR SULFATE 300 MG PO CAPS: 300.0000 mg | ORAL_CAPSULE | Freq: Every day | ORAL | Status: DC

## 2012-02-19 MED ORDER — EMTRICITABINE-TENOFOVIR DF 200-300 MG PO TABS: 1.0000 | ORAL_TABLET | Freq: Every day | ORAL | Status: DC

## 2012-02-19 MED ORDER — RITONAVIR 100 MG PO TABS: 100.0000 mg | ORAL_TABLET | Freq: Every day | ORAL | Status: DC

## 2012-02-19 NOTE — Assessment & Plan Note (Signed)
Continue the cymbalta referring to [facility] counselor

## 2012-02-19 NOTE — Progress Notes (Signed)
Subjective:    Patient ID: [name], female    DOB: 20-Sep-1951, 61 y.o.   MRN: [id number]  HPI  61 year old [place] American lady who is newly diagnosed HIV with very high viral load but healthy cd4 count. She has chronic pain and was also found to have thyroid cancer by ENT. They tested her for HIV and are going to pursue surgery. The patient is informing me that the surgery is being delayed, but I do not know if this is genuinely the case or if there are specific endpoints that her surgeons are looking for. Certainly in terms of her HIV she has a high CD4 count so she is not immunocompromised from HIV at this point. We reviewed DHHS guidelines and I decided to put her on boosted reyataz with truvada. Will bring back for repeat CD4 and VL in one month.  She apparently hs problems with chronic pain and I wonder about substance abuse. She was voicing homicidal ideation with re to her former boyfriend at intake but now denies having such plans and denies SI. She is on cymbalta. We have referred to [facility] counselor. She is contracted for safety.  I spent greater than 60 minutes with the patient including greater than 50% of time in face to face counsel of the patient and in coordination of their care.     Review of Systems  Constitutional: Negative for fever, chills, diaphoresis, activity change, appetite change, fatigue and unexpected weight change.  HENT: Negative for congestion, sore throat, rhinorrhea, sneezing, trouble swallowing and sinus pressure.   Eyes: Negative for photophobia and visual disturbance.  Respiratory: Negative for cough, chest tightness, shortness of breath, wheezing and stridor.   Cardiovascular: Negative for chest pain, palpitations and leg swelling.  Gastrointestinal: Negative for nausea, vomiting, abdominal pain, diarrhea, constipation, blood in stool, abdominal distention and anal bleeding.  Genitourinary: Negative for dysuria, hematuria, flank pain and difficulty  urinating.  Musculoskeletal: Positive for back pain and arthralgias. Negative for myalgias, joint swelling and gait problem.  Skin: Negative for color change, pallor, rash and wound.  Neurological: Negative for dizziness, tremors, weakness and light-headedness.  Hematological: Negative for adenopathy. Does not bruise/bleed easily.  Psychiatric/Behavioral: Negative for behavioral problems, confusion, sleep disturbance, dysphoric mood, decreased concentration and agitation.       Objective:   Physical Exam  Constitutional: She is oriented to person, place, and time. She appears well-developed and well-nourished. No distress.  HENT:  Head: Normocephalic and atraumatic.  Mouth/Throat: Oropharynx is clear and moist. No oropharyngeal exudate.  Eyes: Conjunctivae and EOM are normal. Pupils are equal, round, and reactive to light. No scleral icterus.  Neck: Normal range of motion. Neck supple. No JVD present.  Cardiovascular: Normal rate, regular rhythm and normal heart sounds.  Exam reveals no gallop and no friction rub.   No murmur heard. Pulmonary/Chest: Effort normal and breath sounds normal. No respiratory distress. She has no wheezes. She has no rales. She exhibits no tenderness.  Abdominal: She exhibits no distension and no mass. There is no tenderness. There is no rebound and no guarding.  Musculoskeletal: She exhibits no edema and no tenderness.       Arms: Lymphadenopathy:    She has no cervical adenopathy.  Neurological: She is alert and oriented to person, place, and time. She has normal reflexes. She exhibits normal muscle tone. Coordination normal.  Skin: Skin is warm and dry. She is not diaphoretic. No erythema. No pallor.  Psychiatric: Her behavior is normal.  Judgment and thought content normal. Her mood appears anxious.          Assessment & Plan:  HIV (human immunodeficiency virus infection) Start reyataz novir and truvada  DEPRESSION, MAJOR, RECURRENT Continue the  cymbalta referring to [facility] counselor  Thyroid cancer Needs resection. I will send this note to ENT. I do NOT see reason for delay in surgery given that pt has a healthy CD4 count. Her virus is not yet suppressed but should be within next two months, but this would not put her at risk in OR

## 2012-02-19 NOTE — Assessment & Plan Note (Signed)
Start reyataz novir and truvada

## 2012-02-19 NOTE — Assessment & Plan Note (Signed)
Needs resection. I will send this note to ENT. I do NOT see reason for delay in surgery given that pt has a healthy CD4 count. Her virus is not yet suppressed but should be within next two months, but this would not put her at risk in OR

## 2012-03-09 ENCOUNTER — Encounter (HOSPITAL_COMMUNITY)
Admission: RE | Admit: 2012-03-09 | Discharge: 2012-03-09 | Disposition: A | Discharge status: Home / Self Care | Payer: Medicaid Other | Source: Ambulatory Visit | Attending: Otolaryngology | Admitting: Otolaryngology

## 2012-03-09 ENCOUNTER — Encounter (HOSPITAL_COMMUNITY): Payer: Self-pay

## 2012-03-09 HISTORY — DX: Headache: R51

## 2012-03-09 HISTORY — DX: Anxiety disorder, unspecified: F41.9

## 2012-03-09 HISTORY — DX: Major depressive disorder, single episode, unspecified: F32.9

## 2012-03-09 HISTORY — DX: Depression, unspecified: F32.A

## 2012-03-09 LAB — BASIC METABOLIC PANEL
BUN: 15 mg/dL (ref 6–23)
Chloride: 103 mEq/L (ref 96–112)
GFR calc Af Amer: 59 mL/min — ABNORMAL LOW (ref >90)
Potassium: 3.9 mEq/L (ref 3.5–5.1)

## 2012-03-09 LAB — SURGICAL PCR SCREEN
MRSA, PCR: NEGATIVE
Staphylococcus aureus: NEGATIVE

## 2012-03-09 LAB — CBC
HCT: 36.9 % (ref 36.0–46.0)
Hemoglobin: 12.6 g/dL (ref 12.0–15.0)
MCHC: 34.1 g/dL (ref 30.0–36.0)

## 2012-03-09 NOTE — Pre-Procedure Instructions (Addendum)
20 ARMENTA ERSKIN  03/09/2012   Your procedure is scheduled on:  03/11/12  Report to [facility] at730* AM.  Call this number if you have problems the morning of surgery: [phone]   Remember:   Do not eat food:After Midnight.  May have clear liquids: up to 4 Hours before arrival. 330am  Clear liquids include soda, tea, black coffee, apple or grape juice, broth.  Take these medicines the morning of surgery with A SIP OF WATER:cymbalta, antiviral meds if take in am STOP meloxicam   Do not wear jewelry, make-up or nail polish.  Do not wear lotions, powders, or perfumes. You may wear deodorant.  Do not shave 48 hours prior to surgery.  Do not bring valuables to the hospital.  Contacts, dentures or bridgework may not be worn into surgery.  Leave suitcase in the car. After surgery it may be brought to your room.  For patients admitted to the hospital, checkout time is 11:00 AM the day of discharge.   Patients discharged the day of surgery will not be allowed to drive home.  Name and phone number of your driver: floyd blackwell [phone]  Special Instructions: CHG Shower Use Special Wash: 1/2 bottle night before surgery and 1/2 bottle morning of surgery.   Please read over the following fact sheets that you were given: Pain Booklet, Coughing and Deep Breathing, MRSA Information and Surgical Site Infection Prevention

## 2012-03-11 ENCOUNTER — Ambulatory Visit (HOSPITAL_COMMUNITY)
Admission: RE | Admit: 2012-03-11 | Discharge: 2012-03-12 | Disposition: A | Discharge status: Home / Self Care | Payer: Medicaid Other | Source: Ambulatory Visit | Attending: Otolaryngology | Admitting: Otolaryngology

## 2012-03-11 ENCOUNTER — Ambulatory Visit (HOSPITAL_COMMUNITY): Payer: Medicaid Other | Admitting: Anesthesiology

## 2012-03-11 ENCOUNTER — Encounter (HOSPITAL_COMMUNITY): Payer: Self-pay | Admitting: Anesthesiology

## 2012-03-11 ENCOUNTER — Encounter (HOSPITAL_COMMUNITY)
Admission: RE | Disposition: A | Discharge status: Home / Self Care | Payer: Self-pay | Source: Ambulatory Visit | Attending: Otolaryngology

## 2012-03-11 ENCOUNTER — Encounter (HOSPITAL_COMMUNITY): Payer: Self-pay | Admitting: *Deleted

## 2012-03-11 ENCOUNTER — Encounter (HOSPITAL_COMMUNITY): Payer: Self-pay | Admitting: General Practice

## 2012-03-11 DIAGNOSIS — F411 Generalized anxiety disorder: Secondary | ICD-10-CM | POA: Insufficient documentation

## 2012-03-11 DIAGNOSIS — C73 Malignant neoplasm of thyroid gland: Secondary | ICD-10-CM

## 2012-03-11 DIAGNOSIS — J449 Chronic obstructive pulmonary disease, unspecified: Secondary | ICD-10-CM | POA: Insufficient documentation

## 2012-03-11 DIAGNOSIS — Z21 Asymptomatic human immunodeficiency virus [HIV] infection status: Secondary | ICD-10-CM | POA: Insufficient documentation

## 2012-03-11 DIAGNOSIS — R51 Headache: Secondary | ICD-10-CM | POA: Insufficient documentation

## 2012-03-11 DIAGNOSIS — B20 Human immunodeficiency virus [HIV] disease: Secondary | ICD-10-CM

## 2012-03-11 DIAGNOSIS — Z01812 Encounter for preprocedural laboratory examination: Secondary | ICD-10-CM | POA: Insufficient documentation

## 2012-03-11 DIAGNOSIS — J4489 Other specified chronic obstructive pulmonary disease: Secondary | ICD-10-CM | POA: Insufficient documentation

## 2012-03-11 HISTORY — DX: Asymptomatic human immunodeficiency virus (hiv) infection status: Z21

## 2012-03-11 HISTORY — DX: Malignant neoplasm of thyroid gland: C73

## 2012-03-11 HISTORY — DX: Shortness of breath: R06.02

## 2012-03-11 HISTORY — PX: THYROIDECTOMY: SHX17

## 2012-03-11 HISTORY — DX: Human immunodeficiency virus (HIV) disease: B20

## 2012-03-11 HISTORY — DX: Pneumonia, unspecified organism: J18.9

## 2012-03-11 HISTORY — DX: Fibromyalgia: M79.7

## 2012-03-11 SURGERY — THYROIDECTOMY
Anesthesia: General | Site: Neck | Laterality: Bilateral | Wound class: Clean

## 2012-03-11 MED ORDER — ONDANSETRON HCL 4 MG/2ML IJ SOLN
INTRAMUSCULAR | Status: DC | PRN
  Administered 2012-03-11: 4 mg via INTRAVENOUS

## 2012-03-11 MED ORDER — SUCCINYLCHOLINE CHLORIDE 20 MG/ML IJ SOLN
INTRAMUSCULAR | Status: DC | PRN
  Administered 2012-03-11: 100 mg via INTRAVENOUS

## 2012-03-11 MED ORDER — MORPHINE SULFATE 2 MG/ML IJ SOLN: 0.0500 mg/kg | INTRAMUSCULAR | Status: DC | PRN

## 2012-03-11 MED ORDER — OXYCODONE-ACETAMINOPHEN 5-325 MG PO TABS
1.0000 | ORAL_TABLET | ORAL | Status: DC | PRN
  Administered 2012-03-11 – 2012-03-12 (×3): 1 via ORAL
  Filled 2012-03-11 (×3): qty 1

## 2012-03-11 MED ORDER — SUFENTANIL CITRATE 50 MCG/ML IV SOLN
INTRAVENOUS | Status: DC | PRN
  Administered 2012-03-11: 20 ug via INTRAVENOUS
  Administered 2012-03-11: 40 ug via INTRAVENOUS
  Administered 2012-03-11: 20 ug via INTRAVENOUS
  Administered 2012-03-11: 10 ug via INTRAVENOUS

## 2012-03-11 MED ORDER — LACTATED RINGERS IV SOLN
INTRAVENOUS | Status: DC
  Administered 2012-03-11: 09:00:00 via INTRAVENOUS

## 2012-03-11 MED ORDER — PNEUMOCOCCAL VAC POLYVALENT 25 MCG/0.5ML IJ INJ
0.5000 mL | INJECTION | INTRAMUSCULAR | Status: DC
  Filled 2012-03-11: qty 0.5

## 2012-03-11 MED ORDER — MIDAZOLAM HCL 5 MG/5ML IJ SOLN
INTRAMUSCULAR | Status: DC | PRN
  Administered 2012-03-11: 2 mg via INTRAVENOUS

## 2012-03-11 MED ORDER — ONDANSETRON HCL 4 MG/2ML IJ SOLN
4.0000 mg | INTRAMUSCULAR | Status: DC | PRN
  Filled 2012-03-11: qty 2

## 2012-03-11 MED ORDER — EMTRICITABINE-TENOFOVIR DF 200-300 MG PO TABS
1.0000 | ORAL_TABLET | Freq: Every day | ORAL | Status: DC
  Administered 2012-03-11 – 2012-03-12 (×2): 1 via ORAL
  Filled 2012-03-11 (×2): qty 1

## 2012-03-11 MED ORDER — MORPHINE SULFATE 4 MG/ML IJ SOLN
2.0000 mg | INTRAMUSCULAR | Status: DC | PRN
  Administered 2012-03-11 (×2): 4 mg via INTRAVENOUS
  Filled 2012-03-11 (×2): qty 1

## 2012-03-11 MED ORDER — PHENYLEPHRINE HCL 10 MG/ML IJ SOLN
INTRAMUSCULAR | Status: DC | PRN
  Administered 2012-03-11 (×2): 40 ug via INTRAVENOUS
  Administered 2012-03-11: 80 ug via INTRAVENOUS
  Administered 2012-03-11 (×4): 40 ug via INTRAVENOUS

## 2012-03-11 MED ORDER — ATAZANAVIR SULFATE 150 MG PO CAPS
300.0000 mg | ORAL_CAPSULE | Freq: Every day | ORAL | Status: DC
  Administered 2012-03-12: 300 mg via ORAL
  Filled 2012-03-11 (×2): qty 2

## 2012-03-11 MED ORDER — RITONAVIR 100 MG PO TABS
100.0000 mg | ORAL_TABLET | Freq: Every day | ORAL | Status: DC
  Administered 2012-03-11 – 2012-03-12 (×2): 100 mg via ORAL
  Filled 2012-03-11 (×2): qty 1

## 2012-03-11 MED ORDER — HYDROMORPHONE HCL PF 1 MG/ML IJ SOLN
0.2500 mg | INTRAMUSCULAR | Status: DC | PRN
  Administered 2012-03-11 (×2): 0.5 mg via INTRAVENOUS

## 2012-03-11 MED ORDER — EPHEDRINE SULFATE 50 MG/ML IJ SOLN
INTRAMUSCULAR | Status: DC | PRN
  Administered 2012-03-11: 10 mg via INTRAVENOUS

## 2012-03-11 MED ORDER — LACTATED RINGERS IV SOLN
INTRAVENOUS | Status: DC | PRN
  Administered 2012-03-11: 09:00:00 via INTRAVENOUS

## 2012-03-11 MED ORDER — MELOXICAM 15 MG PO TABS
15.0000 mg | ORAL_TABLET | Freq: Every day | ORAL | Status: DC
  Administered 2012-03-11 – 2012-03-12 (×2): 15 mg via ORAL
  Filled 2012-03-11 (×2): qty 1

## 2012-03-11 MED ORDER — ONDANSETRON HCL 4 MG PO TABS: 4.0000 mg | ORAL_TABLET | ORAL | Status: DC | PRN

## 2012-03-11 MED ORDER — 0.9 % SODIUM CHLORIDE (POUR BTL) OPTIME
TOPICAL | Status: DC | PRN
  Administered 2012-03-11: 1000 mL

## 2012-03-11 MED ORDER — DEXTROSE-NACL 5-0.45 % IV SOLN
INTRAVENOUS | Status: DC
  Administered 2012-03-11 – 2012-03-12 (×3): via INTRAVENOUS

## 2012-03-11 MED ORDER — DULOXETINE HCL 60 MG PO CPEP
60.0000 mg | ORAL_CAPSULE | Freq: Every day | ORAL | Status: DC
  Administered 2012-03-11 – 2012-03-12 (×2): 60 mg via ORAL
  Filled 2012-03-11 (×2): qty 1

## 2012-03-11 MED ORDER — PROPOFOL 10 MG/ML IV EMUL
INTRAVENOUS | Status: DC | PRN
  Administered 2012-03-11 (×2): 100 mg via INTRAVENOUS

## 2012-03-11 SURGICAL SUPPLY — 66 items
APPLIER CLIP 9.375 SM OPEN (CLIP)
ATTRACTOMAT 16X20 MAGNETIC DRP (DRAPES) IMPLANT
BLADE SURG 15 STRL LF DISP TIS (BLADE) IMPLANT
BLADE SURG 15 STRL SS (BLADE)
BLADE SURG ROTATE 9660 (MISCELLANEOUS) IMPLANT
CANISTER SUCTION 2500CC (MISCELLANEOUS) ×2 IMPLANT
CLEANER TIP ELECTROSURG 2X2 (MISCELLANEOUS) ×2 IMPLANT
CLIP APPLIE 9.375 SM OPEN (CLIP) IMPLANT
CLOTH BEACON ORANGE TIMEOUT ST (SAFETY) ×2 IMPLANT
CONT SPEC 4OZ CLIKSEAL STRL BL (MISCELLANEOUS) ×4 IMPLANT
CORDS BIPOLAR (ELECTRODE) ×2 IMPLANT
COVER SURGICAL LIGHT HANDLE (MISCELLANEOUS) ×2 IMPLANT
CRADLE DONUT ADULT HEAD (MISCELLANEOUS) ×2 IMPLANT
DERMABOND ADVANCED (GAUZE/BANDAGES/DRESSINGS) ×1
DERMABOND ADVANCED .7 DNX12 (GAUZE/BANDAGES/DRESSINGS) ×1 IMPLANT
DRAIN CHANNEL 15F RND FF W/TCR (WOUND CARE) IMPLANT
DRAIN HEMOVAC 1/8 X 5 (WOUND CARE) IMPLANT
DRAIN JACKSON RD 7FR 3/32 (WOUND CARE) ×2 IMPLANT
ELECT COATED BLADE 2.86 ST (ELECTRODE) ×2 IMPLANT
ELECT REM PT RETURN 9FT ADLT (ELECTROSURGICAL) ×2
ELECTRODE REM PT RTRN 9FT ADLT (ELECTROSURGICAL) ×1 IMPLANT
EVACUATOR SILICONE 100CC (DRAIN) ×2 IMPLANT
GAUZE SPONGE 4X4 16PLY XRAY LF (GAUZE/BANDAGES/DRESSINGS) ×2 IMPLANT
GLOVE BIO SURGEON STRL SZ7 (GLOVE) ×2 IMPLANT
GLOVE BIO SURGEON STRL SZ7.5 (GLOVE) ×2 IMPLANT
GLOVE BIO SURGEON STRL SZ8 (GLOVE) ×2 IMPLANT
GLOVE BIOGEL PI IND STRL 6.5 (GLOVE) ×2 IMPLANT
GLOVE BIOGEL PI IND STRL 7.0 (GLOVE) ×1 IMPLANT
GLOVE BIOGEL PI IND STRL 7.5 (GLOVE) ×1 IMPLANT
GLOVE BIOGEL PI IND STRL 8 (GLOVE) ×2 IMPLANT
GLOVE BIOGEL PI INDICATOR 6.5 (GLOVE) ×2
GLOVE BIOGEL PI INDICATOR 7.0 (GLOVE) ×1
GLOVE BIOGEL PI INDICATOR 7.5 (GLOVE) ×1
GLOVE BIOGEL PI INDICATOR 8 (GLOVE) ×2
GLOVE ECLIPSE 7.5 STRL STRAW (GLOVE) ×6 IMPLANT
GLOVE SURG SS PI 6.5 STRL IVOR (GLOVE) ×4 IMPLANT
GLOVE SURG SS PI 7.0 STRL IVOR (GLOVE) ×2 IMPLANT
GOWN PREVENTION PLUS XLARGE (GOWN DISPOSABLE) ×2 IMPLANT
GOWN STRL NON-REIN LRG LVL3 (GOWN DISPOSABLE) ×10 IMPLANT
KIT BASIN OR (CUSTOM PROCEDURE TRAY) ×2 IMPLANT
KIT ROOM TURNOVER OR (KITS) ×2 IMPLANT
LOCATOR NERVE 3 VOLT (DISPOSABLE) IMPLANT
NS IRRIG 1000ML POUR BTL (IV SOLUTION) ×2 IMPLANT
PAD ARMBOARD 7.5X6 YLW CONV (MISCELLANEOUS) ×2 IMPLANT
PENCIL FOOT CONTROL (ELECTRODE) ×2 IMPLANT
PROBE NERVBE PRASS .33 (MISCELLANEOUS) ×2 IMPLANT
SHEARS HARMONIC 9CM CVD (BLADE) ×2 IMPLANT
SPONGE INTESTINAL PEANUT (DISPOSABLE) ×2 IMPLANT
STAPLER VISISTAT 35W (STAPLE) ×2 IMPLANT
SUT CHROMIC 4 0 PS 2 18 (SUTURE) ×6 IMPLANT
SUT ETHILON 3 0 PS 1 (SUTURE) ×2 IMPLANT
SUT SILK 2 0 (SUTURE) ×1
SUT SILK 2-0 18XBRD TIE 12 (SUTURE) ×1 IMPLANT
SUT SILK 3 0 (SUTURE) ×1
SUT SILK 3-0 18XBRD TIE 12 (SUTURE) ×1 IMPLANT
SUT SILK 4 0 (SUTURE) ×1
SUT SILK 4-0 18XBRD TIE 12 (SUTURE) ×1 IMPLANT
TOWEL OR 17X24 6PK STRL BLUE (TOWEL DISPOSABLE) ×2 IMPLANT
TOWEL OR 17X26 10 PK STRL BLUE (TOWEL DISPOSABLE) ×2 IMPLANT
TRAY ENT MC OR (CUSTOM PROCEDURE TRAY) ×2 IMPLANT
TRAY FOLEY CATH 14FRSI W/METER (CATHETERS) IMPLANT
TUBE ENDOTRAC EMG 7X10.2 (MISCELLANEOUS) ×2 IMPLANT
TUBE ENDOTRAC EMG 8X11.3 (MISCELLANEOUS) IMPLANT
TUBE ENDOTRACH  EMG 6MMTUBE EN (MISCELLANEOUS)
TUBE ENDOTRACH EMG 6MMTUBE EN (MISCELLANEOUS) IMPLANT
WATER STERILE IRR 1000ML POUR (IV SOLUTION) IMPLANT

## 2012-03-11 NOTE — Op Note (Signed)
Preop/postop diagnosis: Right thyroid papillary carcinoma Procedure: Total thyroidectomy with central node dissection Anesthesia: Gen. Estimated blood loss approximately 25 cc Endotracheal Nims monitor Indications: 61 year old who's had a newly diagnosis of HIV with lymphadenopathy in her neck and elsewhere with a needle positive biopsy of a right thyroid mass for papillary carcinoma. She had noticed needled in her neck is make sure they were not papillary carcinoma although not all of the node obviously were sampled. She was informed of the options of treatment including thyroid scan and she elected to proceed with removal of the thyroid first and then the with the neck in lymph node workup for papillary afterwords knowing that she may need a subsequent neck dissection. She was informed risks, benefits, and options of the total thyroidectomy. All her questions are answered and consent was obtained. Operation: Patient was taken to the operating room placed in supine position after Nims monitor endotracheal tube anesthesia was prepped and draped in the usual sterile manner. The Nims monitor seen in the operating well with stimulation. An incision was made in the inferior aspect of the neck a curvilinear manner with a superior and inferior flap elevated. The self-retaining retractor was positioned. Goal into the central midline of the neck the strap muscles were divided and retracted laterally. The thyroid was immediately encountered and dissection was begun on the right side following the capsule very closely and carefully dissecting what appeared to be a inferior parathyroid gland off and in the superior aspect was also dissected which did go up into the upper portion of the neck slightly. As the thyroid gland was retracted medially the recurrent nerve was easily identified dissected up to the thyroid cartilage where it entered into the larynx. The dissection was continued going above the nerve removing the  thyroid gland from Berry's ligament and bringing the thyroid up to the isthmus. The dissection was then carried out on the left side with the same dissection dissecting inferiorly and superiorly the parathyroid gland appeared to be identified on the inferior aspect and dissected off the gland. The superior gland was very small and seen to be adherent within the gland was dissected but only a portion of it was remaining appeared to be not connected to any vasculature so was removed and placed into the sternocleidomastoid muscle. The dissection was carried inferior as the gland was retracted medially and again the nerve was identified and dissected into the larynx. The gland was brought off Berry's ligament and the thyroid gland was removed. Both nerves were stimulated with the Nims monitor and had good response. The central compartment was then dissected dissecting along the lateral aspects down into the superior mediastinum and is dissecting carefully with the harmonic scalpel as well as electrocautery and hemostat dissection. There was a couple of nodes identified within this specimen. It was removed and sent for permanent section. Was good hemostasis. All wound was irrigated with saline. The #7 drain was placed and brought out laterally. The strap muscles were closed over the trachea with interrupted 3-0 chromic and then the subcutaneous closed with interrupted 4-0 chromic. Skin was closed with Dermabond. The drain was secured with a 3-0 nylon. The patient is awake and brought to cover stable condition counts correct

## 2012-03-11 NOTE — Transfer of Care (Signed)
Immediate Anesthesia Transfer of Care Note  Patient: [name]  Procedure(s) Performed: Procedure(s) (LRB): THYROIDECTOMY (Bilateral)  Patient Location: PACU  Anesthesia Type: General  Level of Consciousness: sedated and patient cooperative  Airway & Oxygen Therapy: Patient Spontanous Breathing and Patient connected to nasal cannula oxygen  Post-op Assessment: Report given to PACU RN, Post -op Vital signs reviewed and stable and Patient moving all extremities  Post vital signs: Reviewed and stable  Complications: No apparent anesthesia complications

## 2012-03-11 NOTE — Anesthesia Postprocedure Evaluation (Signed)
  Anesthesia Post-op Note  Patient: [name]  Procedure(s) Performed: Procedure(s) (LRB): THYROIDECTOMY (Bilateral)  Patient Location: PACU  Anesthesia Type: General  Level of Consciousness: awake  Airway and Oxygen Therapy: Patient Spontanous Breathing  Post-op Pain: mild  Post-op Assessment: Post-op Vital signs reviewed  Post-op Vital Signs: Reviewed  Complications: No apparent anesthesia complications

## 2012-03-11 NOTE — Progress Notes (Signed)
Day of Surgery  Subjective: Doing well.  The neck feels sore.  Objective: Vital signs in last 24 hours: Temp:  [97.6 F (36.4 C)-98.7 F (37.1 C)] 97.7 F (36.5 C) (04/11 1726) Pulse Rate:  [65-84] 66  (04/11 1726) Resp:  [16-32] 19  (04/11 1726) BP: (115-153)/(71-85) 153/84 mmHg (04/11 1726) SpO2:  [97 %-100 %] 100 % (04/11 1726) Weight:  [67.132 kg (148 lb)] 67.132 kg (148 lb) (04/11 1300)    Intake/Output from previous day:   Intake/Output this shift: Total I/O In: 2450 [I.V.:2450] Out: 20 [Blood:20]  General appearance: alert, cooperative, no distress and scratchy but good voice. Neck: thyroid incision clean and intact, no fluid collection, drain functioning.  Lab Results:   Basename 03/09/12 1553  WBC 4.3  HGB 12.6  HCT 36.9  PLT 267   BMET  Basename 03/09/12 1553  NA 137  K 3.9  CL 103  CO2 25  GLUCOSE 100*  BUN 15  CREATININE 1.16*  CALCIUM 9.6   PT/INR No results found for this basename: LABPROT:2,INR:2 in the last 72 hours ABG No results found for this basename: PHART:2,PCO2:2,PO2:2,HCO3:2 in the last 72 hours  Studies/Results: No results found.  Anti-infectives: Anti-infectives     Start     Dose/Rate Route Frequency Ordered Stop   03/12/12 0800   atazanavir (REYATAZ) capsule 300 mg        300 mg Oral Daily with breakfast 03/11/12 1555     03/11/12 1600   ritonavir (NORVIR) tablet 100 mg        100 mg Oral Daily 03/11/12 1501     03/11/12 1600   emtricitabine-tenofovir (TRUVADA) 200-300 MG per tablet 1 tablet        1 tablet Oral Daily 03/11/12 1501            Assessment/Plan: s/p Procedure(s) (LRB): THYROIDECTOMY (Bilateral) Doing well.  Good voice.  Drain in place, continue overnight.  Monitor calcium level through night.  LOS: 0 days    Ivi Griffith 03/11/2012

## 2012-03-11 NOTE — Progress Notes (Signed)
SISTER CYNTHIA  [place]   [phone] [id number]

## 2012-03-11 NOTE — H&P (Signed)
[name] is an 61 y.o. female.   Chief Complaint: Papillary carcinoma of  the thyroid HPI: 61 year old who's here for removal of her thyroid gland secondary to papillary carcinoma which is needle positive. She also has HIV infection and recently diagnosed and seen by infectious disease. She has multiple known to in her neck as well as her axilla. She had needle biopsy of 2 of these nodes which were just lymphocytic infiltrate with no evidence of papillary carcinoma. She is ready to proceed with the total thyroidectomy.  Past Medical History  Diagnosis Date  . Arthritis   . COPD (chronic obstructive pulmonary disease)   . Lipoma   . Headache   . Anxiety   . Depression   . HIV (human immunodeficiency virus infection)     Past Surgical History  Procedure Date  . Spine surgery 2010    cerv    Family History  Problem Relation Age of Onset  . Heart disease Maternal Uncle    Social History:  reports that she has been smoking Cigarettes.  She has a 86 pack-year smoking history. She does not have any smokeless tobacco history on file. She reports that she uses illicit drugs (Marijuana) about once per week. She reports that she does not drink alcohol.  Allergies:  Allergies  Allergen Reactions  . Sulfa Antibiotics Hives    No current facility-administered medications on file as of 03/11/2012.   Medications Prior to Admission  Medication Sig Dispense Refill  . atazanavir (REYATAZ) 300 MG capsule Take 1 capsule (300 mg total) by mouth daily with breakfast.  30 capsule  11  . DULoxetine (CYMBALTA) 60 MG capsule Take 60 mg by mouth daily.      Marland Kitchen emtricitabine-tenofovir (TRUVADA) 200-300 MG per tablet Take 1 tablet by mouth daily.  30 tablet  11  . meloxicam (MOBIC) 15 MG tablet Take 15 mg by mouth daily.      . ritonavir (NORVIR) 100 MG TABS Take 1 tablet (100 mg total) by mouth daily.  30 tablet  11    Results for orders placed during the hospital encounter of 03/09/12 (from the  past 48 hour(s))  SURGICAL PCR SCREEN     Status: Normal   Collection Time   03/09/12  3:30 PM      Component Value Range Comment   MRSA, PCR NEGATIVE  NEGATIVE     Staphylococcus aureus NEGATIVE  NEGATIVE    BASIC METABOLIC PANEL     Status: Abnormal   Collection Time   03/09/12  3:53 PM      Component Value Range Comment   Sodium 137  135 - 145 (mEq/L)    Potassium 3.9  3.5 - 5.1 (mEq/L)    Chloride 103  96 - 112 (mEq/L)    CO2 25  19 - 32 (mEq/L)    Glucose, Bld 100 (*) 70 - 99 (mg/dL)    BUN 15  6 - 23 (mg/dL)    Creatinine, Ser 5.78 (*) 0.50 - 1.10 (mg/dL)    Calcium 9.6  8.4 - 10.5 (mg/dL)    GFR calc non Af Amer 50 (*) >90 (mL/min)    GFR calc Af Amer 59 (*) >90 (mL/min)   CBC     Status: Normal   Collection Time   03/09/12  3:53 PM      Component Value Range Comment   WBC 4.3  4.0 - 10.5 (K/uL)    RBC 4.28  3.87 - 5.11 (MIL/uL)  Hemoglobin 12.6  12.0 - 15.0 (g/dL)    HCT 45.4  09.8 - 11.9 (%)    MCV 86.2  78.0 - 100.0 (fL)    MCH 29.4  26.0 - 34.0 (pg)    MCHC 34.1  30.0 - 36.0 (g/dL)    RDW 14.7  82.9 - 56.2 (%)    Platelets 267  150 - 400 (K/uL)    No results found.  Review of Systems  HENT: Negative.   Eyes: Negative.   Respiratory: Negative.   Cardiovascular: Negative.   Skin: Negative.     Blood pressure 126/85, pulse 66, temperature 98.2 F (36.8 C), temperature source Oral, resp. rate 18, SpO2 100.00%. Physical Exam  Constitutional: She appears well-nourished.  HENT:  Nose: Nose normal.  Mouth/Throat: Oropharynx is clear and moist.  Eyes: Pupils are equal, round, and reactive to light.  Neck: Normal range of motion.       She does have lymphadenopathy in multiple areas and neck as previously noted  Cardiovascular: Normal rate.   Respiratory: Effort normal.     Assessment/Plan Papillary carcinoma the thyroid-we talked about the nodes in her neck and that it is possible after her thyroid is removed that she may need a neck dissection. She's not had  a thyroid scan and I talked to her about this option prior to surgery blade this scan may be more beneficial in diagnosing the nodes without the thyroid present. She does know that a second operation may need to be done if some of the nodes in her neck highlight concern for papillary even 2 of them have already been biopsy. I offered her to proceed with the thyroid scan first and even to proceed with a elective neck dissection prior and she decided to proceed with the thyroidectomy first. The procedure was discussed and she is ready to proceed.  Suzanna Obey 03/11/2012, 9:11 AM

## 2012-03-11 NOTE — Anesthesia Postprocedure Evaluation (Signed)
  Anesthesia Post-op Note  Patient: [name]  Procedure(s) Performed: Procedure(s) (LRB): THYROIDECTOMY (Bilateral)  Patient Location: PACU  Anesthesia Type: General  Level of Consciousness: awake  Airway and Oxygen Therapy: Patient Spontanous Breathing  Post-op Pain: mild  Post-op Assessment: Post-op Vital signs reviewed  Post-op Vital Signs: Reviewed  Complications: No apparent anesthesia complications 

## 2012-03-11 NOTE — Anesthesia Preprocedure Evaluation (Addendum)
Anesthesia Evaluation  Patient identified by MRN, date of birth, ID band  Reviewed: Allergy & Precautions, H&P , NPO status , Patient's Chart, lab work & pertinent test results  Airway Mallampati: II      Dental  (+) Poor Dentition and Dental Advisory Given   Pulmonary COPD breath sounds clear to auscultation        Cardiovascular negative cardio ROS  Rhythm:Regular Rate:Normal     Neuro/Psych  Headaches, Anxiety    GI/Hepatic negative GI ROS, Neg liver ROS,   Endo/Other    Renal/GU negative Renal ROS     Musculoskeletal   Abdominal   Peds  Hematology negative hematology ROS (+)   Anesthesia Other Findings   Reproductive/Obstetrics                          Anesthesia Physical Anesthesia Plan  ASA: III  Anesthesia Plan: General   Post-op Pain Management:    Induction: Intravenous  Airway Management Planned: Oral ETT  Additional Equipment:   Intra-op Plan:   Post-operative Plan: Extubation in OR  Informed Consent: I have reviewed the patients History and Physical, chart, labs and discussed the procedure including the risks, benefits and alternatives for the proposed anesthesia with the patient or authorized representative who has indicated his/her understanding and acceptance.   Dental advisory given  Plan Discussed with:   Anesthesia Plan Comments:         Anesthesia Quick Evaluation

## 2012-03-12 ENCOUNTER — Encounter (HOSPITAL_COMMUNITY): Payer: Self-pay | Admitting: Otolaryngology

## 2012-03-12 MED ORDER — LEVOTHYROXINE SODIUM 125 MCG PO TABS: 125.0000 ug | ORAL_TABLET | Freq: Every day | ORAL | Status: DC

## 2012-03-12 NOTE — Progress Notes (Signed)
Patient discharged to home in care of family. Medications and instructions reviewed with patient and family with no questions. IV d/c'd with cath intact. Assessment unchanged from this am. Last Ca+ level was 8.4. Work note sent with patient. Patient is to follow up with Dr. Jearld Fenton in 1 week.

## 2012-03-12 NOTE — Progress Notes (Signed)
Patient's calcium level was 8.3,Dr Bates,md on call notified,no new order received.

## 2012-03-12 NOTE — Progress Notes (Signed)
She is doing very well. Her voice is normal. She is taking fluids well.  Her calcium level is 8.4 this morning and was 8.4 the latter part of yesterday. Wound is excellent. Drain was minimal drainage and was removed without difficulty. There is no evidence of infection or hematoma. She is not ready to go home as she lives alone and she wants to stay a little longer. I told her I do think we should get 1 more calcium level but if that is normal then she is appropriate to be discharged. I will start her on Synthroid. We discussed the wound and that she should not get any antibiotic cream or petroleum based products on it. She can get it wet. She will followup in one week.

## 2012-03-12 NOTE — Progress Notes (Addendum)
Noted request for CM consult for transportation needs. However note indicates that pt uses public transportation.  Have referred to CSW to perhaps discuss options with pt . Asked pt RN to notify CSW if pt requires transportation  home today.  Johny Shock RN MPH Case Manager [phone]  a  CARE MANAGEMENT NOTE 03/12/2012  Patient:  [name], [name]   Account Number:  [id number]  Date Initiated:  03/12/2012  Documentation initiated by:  Johny Shock  Subjective/Objective Assessment:   Consult re pt using public transportion for medical appointments.     Action/Plan:   Referral to CSW for transportation options.   Anticipated DC Date:  03/12/2012   Anticipated DC Plan:  HOME/SELF CARE         Choice offered to / List presented to:             Status of service:  Completed, signed off Medicare Important Message given?   (If response is "NO", the following Medicare IM given date fields will be blank) Date Medicare IM given:   Date Additional Medicare IM given:    Discharge Disposition:  HOME/SELF CARE  Per UR Regulation:    If discussed at Long Length of Stay Meetings, dates discussed:    Comments:

## 2012-03-12 NOTE — Progress Notes (Signed)
INITIAL ADULT NUTRITION ASSESSMENT Date: 03/12/2012   Time: 12:51 PM Reason for Assessment: nutrition risk  ASSESSMENT: Female 61 y.o.  Dx: thyroid gland removal  Hx:  Past Medical History  Diagnosis Date  . Arthritis   . COPD (chronic obstructive pulmonary disease)   . Lipoma     right forearm  . Headache   . Anxiety   . Depression   . HIV (human immunodeficiency virus infection)     "dx'd ~ 12/2011"  . Chronic bronchitis   . Pneumonia ~ 2000's    "once"  . Shortness of breath on exertion   . 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 Fibromyalgia     "I think I got that"   Past Surgical History  Procedure Date  . Thyroidectomy 03/11/12    bilaterally  . Anterior cervical decomp/discectomy fusion 05/2009    C3-4; C5-6  . Dilation and curettage of uterus   . Thyroidectomy 03/11/2012    Procedure: THYROIDECTOMY;  Surgeon: Suzanna Obey, MD;  Location: [facility] OR;  Service: ENT;  Laterality: Bilateral;    Related Meds:  Scheduled Meds:   . atazanavir  300 mg Oral Q breakfast  . DULoxetine  60 mg Oral Daily  . emtricitabine-tenofovir  1 tablet Oral Daily  . meloxicam  15 mg Oral Daily  . pneumococcal 23 valent vaccine  0.5 mL Intramuscular Tomorrow-1000  . ritonavir  100 mg Oral Daily   Continuous Infusions:   . dextrose 5 % and 0.45% NaCl 125 mL/hr at 03/12/12 0542  . DISCONTD: lactated ringers 20 mL/hr at 03/11/12 0922   PRN Meds:.morphine, ondansetron (ZOFRAN) IV, ondansetron, oxyCODONE-acetaminophen, DISCONTD: 0.9 % irrigation (POUR BTL), DISCONTD: HYDROmorphone, DISCONTD: morphine   Ht: 5\' 8"  (172.7 cm)  Wt: 148 lb (67.132 kg)  Ideal Wt: 154 lbs % Ideal Wt: 96%  Usual Wt: Pt states wt ranges from 148-158 lbs % Usual Wt: 100%  Body mass index is 22.50 kg/(m^2).  Food/Nutrition Related Hx: Pt reports trouble swallowing occasionally related to thyroid.   Labs:  CMP     Component Value Date/Time   NA 137 03/09/2012 1553   K 3.9 03/09/2012 1553   CL 103  03/09/2012 1553   CO2 25 03/09/2012 1553   GLUCOSE 100* 03/09/2012 1553   BUN 15 03/09/2012 1553   CREATININE 1.16* 03/09/2012 1553   CREATININE 1.13* 02/05/2012 1645   CALCIUM 8.4 03/12/2012 0920   PROT 7.1 02/05/2012 1645   ALBUMIN 4.1 02/05/2012 1645   AST 22 02/05/2012 1645   ALT 14 02/05/2012 1645   ALKPHOS 66 02/05/2012 1645   BILITOT 0.3 02/05/2012 1645   GFRNONAA 50* 03/09/2012 1553   GFRAA 59* 03/09/2012 1553    Intake: 50% breakfast this am Output:   Intake/Output Summary (Last 24 hours) at 03/12/12 1257 Last data filed at 03/12/12 1019  Gross per 24 hour  Intake   2340 ml  Output    920 ml  Net   1420 ml   No BM since admission  Diet Order: Regular  Supplements/Tube Feeding: none at this time  IVF:    dextrose 5 % and 0.45% NaCl Last Rate: 125 mL/hr at 03/12/12 1610  DISCONTD: lactated ringers Last Rate: 20 mL/hr at 03/11/12 9604    Estimated Nutritional Needs:   Kcal:  2015-2150  Protein: 93-107g Fluid:  >2.0 L/day  Pt with recent dx of HIV and papillary carcinoma of the thyroid s/p thyroidectomy yesterday.  Pt resting comfortably in bed at visit; thin appearance.  Reported possible wt loss and dysphagia on admission.  Discussed these with pt who states that she has weighed more, but 148 lbs is a usual wt for and she had occasional problems swallowing with thyroid (which was removed yesterday).  Pt diet was advanced to Regular this morning and she denies trouble swallowing at breakfast.  Reports good appetite states 'I'm hungry now" and is anticipating lunch.  RD provided with small snack while waiting on lunch. Pt states she has been overwhelmed, 'I've been hit with a lot and am trying to sort through it all.'  RD did not address nutrition education specifically related to disease states at this time, but did ask if pt had nutrition-related questions or concerns that could be addressed- pt denies.   NUTRITION DIAGNOSIS: -Increased nutrient needs (NI-5.1).  Status: Ongoing  RELATED  TO: increased metabolic demand with HIV  AS EVIDENCE BY: evidence based practice, pt at lower end of usual wt.  MONITORING/EVALUATION(Goals): 1.  Food/Beverage; pt consuming >75% of meals 2.  Wt/wt change; pt reports she is currently at a usual wt, however also indicates she may have experienced unquantifiable amount of wt loss  EDUCATION NEEDS: -Education needs addressed; pt given opportunity to discuss nutrition. Education at a later date would be more appropriate and well received.  No family present.  INTERVENTION: 1.  General healthful diet; pt currently eating well.  Provided with snacks.  Continue to encourage intake if needed.    Dietitian [phone]  DOCUMENTATION CODES Per approved criteria  -Not Applicable    Loyce Dys [facility] 03/12/2012, 12:51 PM

## 2012-03-12 NOTE — Discharge Instructions (Signed)
She is to followup in one week. We talked about wound infection signs which she is to call if she has any with redness, swelling, increasing pain. Also if she has any change in her voice. Very important is to call if she has any tingling, numbness, or muscle cramps in any location.

## 2012-03-23 ENCOUNTER — Other Ambulatory Visit: Payer: Medicaid Other

## 2012-03-23 ENCOUNTER — Other Ambulatory Visit: Payer: Self-pay

## 2012-03-23 DIAGNOSIS — B20 Human immunodeficiency virus [HIV] disease: Secondary | ICD-10-CM

## 2012-03-23 LAB — COMPLETE METABOLIC PANEL WITH GFR
AST: 18 U/L (ref 0–37)
Albumin: 4.2 g/dL (ref 3.5–5.2)
BUN: 16 mg/dL (ref 6–23)
Calcium: 8.9 mg/dL (ref 8.4–10.5)
Chloride: 103 mEq/L (ref 96–112)
Glucose, Bld: 105 mg/dL — ABNORMAL HIGH (ref 70–99)
Potassium: 4.5 mEq/L (ref 3.5–5.3)

## 2012-03-23 LAB — CBC WITH DIFFERENTIAL/PLATELET
Basophils Absolute: 0 10*3/uL (ref 0.0–0.1)
HCT: 37.1 % (ref 36.0–46.0)
Hemoglobin: 12.5 g/dL (ref 12.0–15.0)
Lymphocytes Relative: 42 % (ref 12–46)
Monocytes Absolute: 0.4 10*3/uL (ref 0.1–1.0)
Monocytes Relative: 9 % (ref 3–12)
Neutro Abs: 2.2 10*3/uL (ref 1.7–7.7)
RBC: 4.23 MIL/uL (ref 3.87–5.11)
WBC: 4.9 10*3/uL (ref 4.0–10.5)

## 2012-03-23 MED ORDER — RITONAVIR 100 MG PO CAPS: 100.0000 mg | ORAL_CAPSULE | Freq: Every day | ORAL | Status: DC

## 2012-03-23 MED ORDER — EMTRICITABINE-TENOFOVIR DF 200-300 MG PO TABS: 1.0000 | ORAL_TABLET | Freq: Every day | ORAL | Status: DC

## 2012-03-23 MED ORDER — ATAZANAVIR SULFATE 300 MG PO CAPS: 300.0000 mg | ORAL_CAPSULE | Freq: Every day | ORAL | Status: DC

## 2012-03-24 LAB — T-HELPER CELL (CD4) - (RCID CLINIC ONLY): CD4 T Cell Abs: 820 uL (ref 400–2700)

## 2012-03-25 LAB — HIV-1 RNA QUANT-NO REFLEX-BLD: HIV-1 RNA Quant, Log: 3.23 {Log} — ABNORMAL HIGH (ref <1.30)

## 2012-04-09 ENCOUNTER — Ambulatory Visit (INDEPENDENT_AMBULATORY_CARE_PROVIDER_SITE_OTHER): Payer: Medicaid Other | Admitting: *Deleted

## 2012-04-09 ENCOUNTER — Other Ambulatory Visit (HOSPITAL_COMMUNITY)
Admission: RE | Admit: 2012-04-09 | Discharge: 2012-04-09 | Disposition: A | Discharge status: Home / Self Care | Payer: Medicaid Other | Source: Ambulatory Visit | Attending: Infectious Diseases | Admitting: Infectious Diseases

## 2012-04-09 DIAGNOSIS — Z01419 Encounter for gynecological examination (general) (routine) without abnormal findings: Secondary | ICD-10-CM | POA: Insufficient documentation

## 2012-04-09 DIAGNOSIS — Z124 Encounter for screening for malignant neoplasm of cervix: Secondary | ICD-10-CM

## 2012-04-09 DIAGNOSIS — Z0000 Encounter for general adult medical examination without abnormal findings: Secondary | ICD-10-CM

## 2012-04-09 NOTE — Progress Notes (Signed)
  Subjective:     [name] is a 61 y.o. woman who comes in today for a  pap smear only.   Objective:    There were no vitals taken for this visit. Pelvic Exam: Pap smear obtained.   Assessment:    Screening pap smear.   Plan:    Follow up in one year, or as indicated by Pap results.  Pt given condoms.  Pt given educational materials re:  HIV and women, BSE, nutrition, diet, exercise, PAP smears and self-esteem.

## 2012-04-09 NOTE — Patient Instructions (Signed)
  Your results will be ready in about a week.  I will either call or mail them to you.  Thank you for coming to the Center for your care.  Angelique Blonder

## 2012-04-13 ENCOUNTER — Ambulatory Visit: Payer: Medicaid Other | Admitting: Infectious Disease

## 2012-04-14 ENCOUNTER — Telehealth: Payer: Self-pay | Admitting: *Deleted

## 2012-04-14 ENCOUNTER — Telehealth: Payer: Self-pay | Admitting: Licensed Clinical Social Worker

## 2012-04-14 NOTE — Telephone Encounter (Signed)
I transferred her to the front to make another appt

## 2012-04-14 NOTE — Telephone Encounter (Signed)
I left a message for the patient to call us and reschedule.

## 2012-04-15 ENCOUNTER — Ambulatory Visit: Payer: Medicaid Other

## 2012-04-15 ENCOUNTER — Encounter: Payer: Self-pay | Admitting: *Deleted

## 2012-04-20 ENCOUNTER — Ambulatory Visit (INDEPENDENT_AMBULATORY_CARE_PROVIDER_SITE_OTHER): Payer: Medicaid Other | Admitting: Infectious Disease

## 2012-04-20 VITALS — BP 108/54 | HR 80 | Temp 98.2°F | Ht 68.0 in | Wt 141.0 lb

## 2012-04-20 DIAGNOSIS — Z21 Asymptomatic human immunodeficiency virus [HIV] infection status: Secondary | ICD-10-CM

## 2012-04-20 DIAGNOSIS — F339 Major depressive disorder, recurrent, unspecified: Secondary | ICD-10-CM

## 2012-04-20 DIAGNOSIS — C73 Malignant neoplasm of thyroid gland: Secondary | ICD-10-CM

## 2012-04-20 DIAGNOSIS — F411 Generalized anxiety disorder: Secondary | ICD-10-CM

## 2012-04-20 DIAGNOSIS — B20 Human immunodeficiency virus [HIV] disease: Secondary | ICD-10-CM

## 2012-04-20 MED ORDER — CLONAZEPAM 0.5 MG PO TABS: 0.5000 mg | ORAL_TABLET | Freq: Every evening | ORAL | Status: DC | PRN

## 2012-04-20 MED ORDER — CITALOPRAM HYDROBROMIDE 20 MG PO TABS: 20.0000 mg | ORAL_TABLET | Freq: Every day | ORAL | Status: DC

## 2012-04-20 NOTE — Assessment & Plan Note (Signed)
Try celexa and klonopin. Offered referral to one of our counselors
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
## 2012-04-20 NOTE — Assessment & Plan Note (Signed)
Recheck VL today and repeat all labs in 2 months time

## 2012-04-20 NOTE — Assessment & Plan Note (Signed)
Margins clean. Will check TSH.

## 2012-04-21 ENCOUNTER — Other Ambulatory Visit: Payer: Self-pay | Admitting: Licensed Clinical Social Worker

## 2012-04-21 DIAGNOSIS — E079 Disorder of thyroid, unspecified: Secondary | ICD-10-CM

## 2012-04-21 DIAGNOSIS — B20 Human immunodeficiency virus [HIV] disease: Secondary | ICD-10-CM

## 2012-04-21 MED ORDER — LEVOTHYROXINE SODIUM 125 MCG PO TABS: 125.0000 ug | ORAL_TABLET | Freq: Every day | ORAL | Status: DC

## 2012-04-21 MED ORDER — ENSURE PO LIQD: 237.0000 mL | Freq: Three times a day (TID) | ORAL | Status: DC

## 2012-04-21 MED ORDER — EMTRICITABINE-TENOFOVIR DF 200-300 MG PO TABS: 1.0000 | ORAL_TABLET | Freq: Every day | ORAL | Status: DC

## 2012-04-21 MED ORDER — RITONAVIR 100 MG PO CAPS: 100.0000 mg | ORAL_CAPSULE | Freq: Every day | ORAL | Status: DC

## 2012-04-21 MED ORDER — ATAZANAVIR SULFATE 300 MG PO CAPS: 300.0000 mg | ORAL_CAPSULE | Freq: Every day | ORAL | Status: DC

## 2012-04-21 NOTE — Progress Notes (Signed)
Addended by: Starleen Arms D on: 04/21/2012 02:54 PM   Modules accepted: Orders

## 2012-05-08 ENCOUNTER — Emergency Department (HOSPITAL_COMMUNITY)
Admission: EM | Admit: 2012-05-08 | Discharge: 2012-05-09 | Disposition: A | Discharge status: Home / Self Care | Payer: Medicaid Other | Attending: Emergency Medicine | Admitting: Emergency Medicine

## 2012-05-08 ENCOUNTER — Emergency Department (HOSPITAL_COMMUNITY): Payer: Medicaid Other

## 2012-05-08 ENCOUNTER — Encounter (HOSPITAL_COMMUNITY): Payer: Self-pay | Admitting: *Deleted

## 2012-05-08 DIAGNOSIS — Z79899 Other long term (current) drug therapy: Secondary | ICD-10-CM | POA: Insufficient documentation

## 2012-05-08 DIAGNOSIS — M542 Cervicalgia: Secondary | ICD-10-CM | POA: Insufficient documentation

## 2012-05-08 DIAGNOSIS — Z21 Asymptomatic human immunodeficiency virus [HIV] infection status: Secondary | ICD-10-CM | POA: Insufficient documentation

## 2012-05-08 DIAGNOSIS — Y92410 Unspecified street and highway as the place of occurrence of the external cause: Secondary | ICD-10-CM | POA: Insufficient documentation

## 2012-05-08 DIAGNOSIS — J4489 Other specified chronic obstructive pulmonary disease: Secondary | ICD-10-CM | POA: Insufficient documentation

## 2012-05-08 DIAGNOSIS — J449 Chronic obstructive pulmonary disease, unspecified: Secondary | ICD-10-CM | POA: Insufficient documentation

## 2012-05-08 DIAGNOSIS — F17200 Nicotine dependence, unspecified, uncomplicated: Secondary | ICD-10-CM | POA: Insufficient documentation

## 2012-05-08 DIAGNOSIS — F341 Dysthymic disorder: Secondary | ICD-10-CM | POA: Insufficient documentation

## 2012-05-08 DIAGNOSIS — M25529 Pain in unspecified elbow: Secondary | ICD-10-CM | POA: Insufficient documentation

## 2012-05-08 DIAGNOSIS — R079 Chest pain, unspecified: Secondary | ICD-10-CM | POA: Insufficient documentation

## 2012-05-08 DIAGNOSIS — Z8739 Personal history of other diseases of the musculoskeletal system and connective tissue: Secondary | ICD-10-CM | POA: Insufficient documentation

## 2012-05-08 MED ORDER — OXYCODONE-ACETAMINOPHEN 5-325 MG PO TABS
2.0000 | ORAL_TABLET | Freq: Once | ORAL | Status: AC
  Administered 2012-05-08: 2 via ORAL
  Filled 2012-05-08 (×2): qty 2
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
## 2012-05-09 ENCOUNTER — Encounter (HOSPITAL_COMMUNITY): Payer: Self-pay | Admitting: Emergency Medicine

## 2012-05-09 ENCOUNTER — Emergency Department (HOSPITAL_COMMUNITY): Payer: Medicaid Other

## 2012-05-09 ENCOUNTER — Observation Stay (HOSPITAL_COMMUNITY)
Admission: EM | Admit: 2012-05-09 | Discharge: 2012-05-10 | Disposition: A | Discharge status: Home / Self Care | Payer: Medicaid Other | Attending: General Surgery | Admitting: General Surgery

## 2012-05-09 DIAGNOSIS — F17200 Nicotine dependence, unspecified, uncomplicated: Secondary | ICD-10-CM | POA: Insufficient documentation

## 2012-05-09 DIAGNOSIS — T1490XA Injury, unspecified, initial encounter: Secondary | ICD-10-CM

## 2012-05-09 DIAGNOSIS — Z85850 Personal history of malignant neoplasm of thyroid: Secondary | ICD-10-CM | POA: Insufficient documentation

## 2012-05-09 DIAGNOSIS — E079 Disorder of thyroid, unspecified: Secondary | ICD-10-CM

## 2012-05-09 DIAGNOSIS — J4489 Other specified chronic obstructive pulmonary disease: Secondary | ICD-10-CM | POA: Insufficient documentation

## 2012-05-09 DIAGNOSIS — M542 Cervicalgia: Secondary | ICD-10-CM | POA: Diagnosis present

## 2012-05-09 DIAGNOSIS — Z720 Tobacco use: Secondary | ICD-10-CM | POA: Diagnosis present

## 2012-05-09 DIAGNOSIS — B20 Human immunodeficiency virus [HIV] disease: Secondary | ICD-10-CM

## 2012-05-09 DIAGNOSIS — J449 Chronic obstructive pulmonary disease, unspecified: Secondary | ICD-10-CM | POA: Insufficient documentation

## 2012-05-09 DIAGNOSIS — R55 Syncope and collapse: Principal | ICD-10-CM | POA: Insufficient documentation

## 2012-05-09 DIAGNOSIS — C73 Malignant neoplasm of thyroid gland: Secondary | ICD-10-CM | POA: Insufficient documentation

## 2012-05-09 DIAGNOSIS — M129 Arthropathy, unspecified: Secondary | ICD-10-CM | POA: Insufficient documentation

## 2012-05-09 DIAGNOSIS — F411 Generalized anxiety disorder: Secondary | ICD-10-CM

## 2012-05-09 DIAGNOSIS — M25521 Pain in right elbow: Secondary | ICD-10-CM | POA: Diagnosis present

## 2012-05-09 DIAGNOSIS — IMO0001 Reserved for inherently not codable concepts without codable children: Secondary | ICD-10-CM | POA: Insufficient documentation

## 2012-05-09 DIAGNOSIS — Z21 Asymptomatic human immunodeficiency virus [HIV] infection status: Secondary | ICD-10-CM | POA: Insufficient documentation

## 2012-05-09 HISTORY — DX: Malignant neoplasm of thyroid gland: C73

## 2012-05-09 LAB — RAPID URINE DRUG SCREEN, HOSP PERFORMED
Barbiturates: NOT DETECTED
Cocaine: POSITIVE — AB

## 2012-05-09 LAB — BASIC METABOLIC PANEL
Calcium: 8 mg/dL — ABNORMAL LOW (ref 8.4–10.5)
GFR calc Af Amer: 64 mL/min — ABNORMAL LOW (ref >90)
GFR calc non Af Amer: 55 mL/min — ABNORMAL LOW (ref >90)
Sodium: 138 mEq/L (ref 135–145)

## 2012-05-09 LAB — CBC
MCH: 30.4 pg (ref 26.0–34.0)
MCHC: 33.4 g/dL (ref 30.0–36.0)
Platelets: 182 10*3/uL (ref 150–400)
RDW: 14.6 % (ref 11.5–15.5)

## 2012-05-09 LAB — GLUCOSE, CAPILLARY: Glucose-Capillary: 104 mg/dL — ABNORMAL HIGH (ref 70–99)

## 2012-05-09 LAB — APTT: aPTT: 29 seconds (ref 24–37)

## 2012-05-09 LAB — PROTIME-INR
INR: 1.02 (ref 0.00–1.49)
Prothrombin Time: 13.6 s (ref 11.6–15.2)

## 2012-05-09 MED ORDER — RITONAVIR 100 MG PO TABS
100.0000 mg | ORAL_TABLET | Freq: Once | ORAL | Status: AC
  Administered 2012-05-09: 100 mg via ORAL
  Filled 2012-05-09: qty 1

## 2012-05-09 MED ORDER — MORPHINE SULFATE 2 MG/ML IJ SOLN
2.0000 mg | Freq: Once | INTRAMUSCULAR | Status: AC
  Administered 2012-05-09: 2 mg via INTRAVENOUS
  Filled 2012-05-09: qty 1

## 2012-05-09 MED ORDER — ALBUTEROL SULFATE HFA 108 (90 BASE) MCG/ACT IN AERS: 2.0000 | INHALATION_SPRAY | RESPIRATORY_TRACT | Status: DC | PRN

## 2012-05-09 MED ORDER — METOPROLOL TARTRATE 12.5 MG HALF TABLET
12.5000 mg | ORAL_TABLET | Freq: Two times a day (BID) | ORAL | Status: DC | PRN
  Filled 2012-05-09: qty 1

## 2012-05-09 MED ORDER — HYDROMORPHONE HCL PF 1 MG/ML IJ SOLN
0.5000 mg | INTRAMUSCULAR | Status: DC | PRN
  Administered 2012-05-10: 1 mg via INTRAVENOUS
  Filled 2012-05-09: qty 1

## 2012-05-09 MED ORDER — ONDANSETRON HCL 4 MG/2ML IJ SOLN: 4.0000 mg | Freq: Four times a day (QID) | INTRAMUSCULAR | Status: DC | PRN

## 2012-05-09 MED ORDER — SODIUM CHLORIDE 0.9 % IV SOLN
Freq: Once | INTRAVENOUS | Status: AC
  Administered 2012-05-09: 10:00:00 via INTRAVENOUS

## 2012-05-09 MED ORDER — LEVOTHYROXINE SODIUM 125 MCG PO TABS
125.0000 ug | ORAL_TABLET | Freq: Every day | ORAL | Status: DC
  Administered 2012-05-09 – 2012-05-10 (×2): 125 ug via ORAL
  Filled 2012-05-09 (×2): qty 1

## 2012-05-09 MED ORDER — CITALOPRAM HYDROBROMIDE 20 MG PO TABS
20.0000 mg | ORAL_TABLET | Freq: Every day | ORAL | Status: DC
  Administered 2012-05-09 – 2012-05-10 (×2): 20 mg via ORAL
  Filled 2012-05-09 (×2): qty 1

## 2012-05-09 MED ORDER — EMTRICITABINE-TENOFOVIR DF 200-300 MG PO TABS
1.0000 | ORAL_TABLET | Freq: Every day | ORAL | Status: DC
  Administered 2012-05-09 – 2012-05-10 (×2): 1 via ORAL
  Filled 2012-05-09 (×3): qty 1

## 2012-05-09 MED ORDER — CLONAZEPAM 0.5 MG PO TABS: 0.5000 mg | ORAL_TABLET | Freq: Every evening | ORAL | Status: DC | PRN

## 2012-05-09 MED ORDER — LACTATED RINGERS IV BOLUS (SEPSIS): 1000.0000 mL | Freq: Three times a day (TID) | INTRAVENOUS | Status: DC | PRN

## 2012-05-09 MED ORDER — BISACODYL 10 MG RE SUPP: 10.0000 mg | Freq: Two times a day (BID) | RECTAL | Status: DC | PRN

## 2012-05-09 MED ORDER — IOHEXOL 300 MG/ML  SOLN
100.0000 mL | Freq: Once | INTRAMUSCULAR | Status: AC | PRN
  Administered 2012-05-09: 100 mL via INTRAVENOUS

## 2012-05-09 MED ORDER — ENSURE PO LIQD: 237.0000 mL | Freq: Three times a day (TID) | ORAL | Status: DC

## 2012-05-09 MED ORDER — DIPHENHYDRAMINE HCL 50 MG/ML IJ SOLN: 12.5000 mg | Freq: Four times a day (QID) | INTRAMUSCULAR | Status: DC | PRN

## 2012-05-09 MED ORDER — MAGNESIUM HYDROXIDE 400 MG/5ML PO SUSP: 30.0000 mL | Freq: Two times a day (BID) | ORAL | Status: DC | PRN

## 2012-05-09 MED ORDER — ADULT MULTIVITAMIN W/MINERALS CH
1.0000 | ORAL_TABLET | Freq: Every day | ORAL | Status: DC
  Administered 2012-05-10: 1 via ORAL
  Filled 2012-05-09: qty 1

## 2012-05-09 MED ORDER — HYDROCODONE-ACETAMINOPHEN 5-325 MG PO TABS: 2.0000 | ORAL_TABLET | ORAL | Status: AC | PRN

## 2012-05-09 MED ORDER — ACETAMINOPHEN 325 MG PO TABS: 650.0000 mg | ORAL_TABLET | ORAL | Status: DC | PRN

## 2012-05-09 MED ORDER — ATAZANAVIR SULFATE 150 MG PO CAPS
300.0000 mg | ORAL_CAPSULE | Freq: Every day | ORAL | Status: DC
  Administered 2012-05-09 – 2012-05-10 (×2): 300 mg via ORAL
  Filled 2012-05-09 (×5): qty 2

## 2012-05-09 MED ORDER — ATAZANAVIR SULFATE 150 MG PO CAPS
300.0000 mg | ORAL_CAPSULE | Freq: Every day | ORAL | Status: DC
  Filled 2012-05-09: qty 2

## 2012-05-09 MED ORDER — DEXTROSE IN LACTATED RINGERS 5 % IV SOLN
INTRAVENOUS | Status: DC
  Administered 2012-05-09 – 2012-05-10 (×3): via INTRAVENOUS

## 2012-05-09 MED ORDER — ONDANSETRON HCL 4 MG PO TABS: 4.0000 mg | ORAL_TABLET | Freq: Four times a day (QID) | ORAL | Status: DC | PRN

## 2012-05-09 MED ORDER — METHOCARBAMOL 100 MG/ML IJ SOLN: 1000.0000 mg | Freq: Once | INTRAMUSCULAR | Status: DC

## 2012-05-09 MED ORDER — OXYCODONE HCL 5 MG PO TABS
5.0000 mg | ORAL_TABLET | ORAL | Status: DC | PRN
  Administered 2012-05-10: 5 mg via ORAL
  Filled 2012-05-09: qty 1

## 2012-05-09 MED ORDER — EMTRICITABINE-TENOFOVIR DF 200-300 MG PO TABS: 1.0000 | ORAL_TABLET | Freq: Every day | ORAL | Status: DC

## 2012-05-09 MED ORDER — OXYCODONE HCL 5 MG PO TABS
10.0000 mg | ORAL_TABLET | ORAL | Status: DC | PRN
  Administered 2012-05-09: 10 mg via ORAL
  Filled 2012-05-09: qty 2

## 2012-05-09 MED ORDER — MELOXICAM 15 MG PO TABS
15.0000 mg | ORAL_TABLET | Freq: Every day | ORAL | Status: DC
  Administered 2012-05-10: 15 mg via ORAL
  Filled 2012-05-09 (×2): qty 1

## 2012-05-09 MED ORDER — HEPARIN SODIUM (PORCINE) 5000 UNIT/ML IJ SOLN
5000.0000 [IU] | Freq: Three times a day (TID) | INTRAMUSCULAR | Status: DC
  Administered 2012-05-09 – 2012-05-10 (×3): 5000 [IU] via SUBCUTANEOUS
  Filled 2012-05-09 (×7): qty 1

## 2012-05-09 MED ORDER — PSYLLIUM 95 % PO PACK
1.0000 | PACK | Freq: Two times a day (BID) | ORAL | Status: DC
  Administered 2012-05-09: 1 via ORAL
  Filled 2012-05-09 (×5): qty 1

## 2012-05-09 MED ORDER — METHOCARBAMOL 100 MG/ML IJ SOLN
1000.0000 mg | Freq: Once | INTRAVENOUS | Status: AC
  Administered 2012-05-09: 1000 mg via INTRAVENOUS
  Filled 2012-05-09: qty 10

## 2012-05-09 MED ORDER — BLISTEX EX OINT
1.0000 "application " | TOPICAL_OINTMENT | Freq: Two times a day (BID) | CUTANEOUS | Status: DC
  Administered 2012-05-09 – 2012-05-10 (×2): 1 via TOPICAL
  Filled 2012-05-09: qty 7

## 2012-05-09 MED ORDER — SODIUM CHLORIDE 0.9 % IV BOLUS (SEPSIS)
1000.0000 mL | Freq: Once | INTRAVENOUS | Status: AC
  Administered 2012-05-09: 1000 mL via INTRAVENOUS

## 2012-05-09 MED ORDER — PROMETHAZINE HCL 25 MG/ML IJ SOLN
12.5000 mg | Freq: Four times a day (QID) | INTRAMUSCULAR | Status: DC | PRN
  Filled 2012-05-09: qty 1

## 2012-05-09 MED ORDER — MAGIC MOUTHWASH
15.0000 mL | Freq: Four times a day (QID) | ORAL | Status: DC | PRN
  Filled 2012-05-09: qty 15

## 2012-05-09 MED ORDER — RITONAVIR 100 MG PO CAPS
100.0000 mg | ORAL_CAPSULE | Freq: Every day | ORAL | Status: DC
  Administered 2012-05-10: 100 mg via ORAL
  Filled 2012-05-09: qty 1
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
## 2012-05-09 NOTE — ED Notes (Signed)
Went in twice to collect patient labs and patient refused both times made nurse and charge nurse aware.
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
## 2012-05-09 NOTE — ED Notes (Signed)
[id number] lab notified me that last blood sent for pt/ptt was clotted. Requested that lab send phlebotomist as pt has already been attempted by 2 RNs and 1 other staff member for blood. Lab will send phlebotomist to draw new labs.

## 2012-05-10 LAB — CBC
HCT: 33.9 % — ABNORMAL LOW (ref 36.0–46.0)
Hemoglobin: 11.4 g/dL — ABNORMAL LOW (ref 12.0–15.0)
MCH: 30.2 pg (ref 26.0–34.0)
MCHC: 33.6 g/dL (ref 30.0–36.0)
RDW: 14.9 % (ref 11.5–15.5)

## 2012-05-10 MED ORDER — METHOCARBAMOL 500 MG PO TABS: 500.0000 mg | ORAL_TABLET | Freq: Four times a day (QID) | ORAL | Status: AC

## 2012-05-10 MED ORDER — OXYCODONE-ACETAMINOPHEN 5-325 MG PO TABS: 1.0000 | ORAL_TABLET | ORAL | Status: AC | PRN

## 2012-05-10 MED ORDER — IBUPROFEN 200 MG PO TABS: 800.0000 mg | ORAL_TABLET | Freq: Three times a day (TID) | ORAL | Status: DC

## 2012-05-10 NOTE — Progress Notes (Signed)
Discharge home. Home discharge instruction given. 
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
## 2012-05-10 NOTE — Progress Notes (Signed)
UR complete 

## 2012-05-10 NOTE — Progress Notes (Signed)
This patient has been seen and I agree with the findings and treatment plan.  Annibelle Brazie O. Aarika Moon, III, MD, FACS [phone] (pager) [phone] (direct pager) Trauma Surgeon  
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
## 2012-05-10 NOTE — Discharge Summary (Signed)
This patient has been seen and I agree with the findings and treatment plan.  Niyam Bisping O. Juliah Scadden, III, MD, FACS [phone] (pager) [phone] (direct pager) Trauma Surgeon  
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
## 2012-05-10 NOTE — Discharge Instructions (Signed)
Increase activity as pain allows. No driving while on oxycodone.

## 2012-05-18 ENCOUNTER — Ambulatory Visit
Admission: RE | Admit: 2012-05-18 | Discharge: 2012-05-18 | Disposition: A | Discharge status: Home / Self Care | Payer: Medicaid Other | Source: Ambulatory Visit | Attending: Infectious Disease | Admitting: Infectious Disease

## 2012-05-18 DIAGNOSIS — Z0000 Encounter for general adult medical examination without abnormal findings: Secondary | ICD-10-CM

## 2012-06-15 ENCOUNTER — Other Ambulatory Visit (HOSPITAL_COMMUNITY): Payer: Self-pay | Admitting: Orthopedic Surgery

## 2012-06-23 ENCOUNTER — Other Ambulatory Visit: Payer: Medicaid Other

## 2012-06-28 ENCOUNTER — Other Ambulatory Visit: Payer: Medicaid Other

## 2012-06-28 DIAGNOSIS — B20 Human immunodeficiency virus [HIV] disease: Secondary | ICD-10-CM

## 2012-06-29 LAB — CBC WITH DIFFERENTIAL/PLATELET
Basophils Absolute: 0 10*3/uL (ref 0.0–0.1)
Lymphocytes Relative: 25 % (ref 12–46)
Neutro Abs: 3.5 10*3/uL (ref 1.7–7.7)
Neutrophils Relative %: 68 % (ref 43–77)
Platelets: 233 10*3/uL (ref 150–400)
RDW: 14.6 % (ref 11.5–15.5)
WBC: 5.2 10*3/uL (ref 4.0–10.5)

## 2012-06-29 LAB — LIPID PANEL
HDL: 47 mg/dL (ref >39)
Triglycerides: 126 mg/dL (ref <150)

## 2012-06-30 LAB — COMPLETE METABOLIC PANEL WITH GFR
Albumin: 4.2 g/dL (ref 3.5–5.2)
BUN: 13 mg/dL (ref 6–23)
Calcium: 9.4 mg/dL (ref 8.4–10.5)
Chloride: 102 mEq/L (ref 96–112)
GFR, Est Non African American: 52 mL/min — ABNORMAL LOW
Glucose, Bld: 102 mg/dL — ABNORMAL HIGH (ref 70–99)
Potassium: 3.7 mEq/L (ref 3.5–5.3)

## 2012-06-30 LAB — T-HELPER CELL (CD4) - (RCID CLINIC ONLY): CD4 T Cell Abs: 530 uL (ref 400–2700)

## 2012-06-30 LAB — HIV-1 RNA QUANT-NO REFLEX-BLD: HIV 1 RNA Quant: <20 copies/mL (ref <20)

## 2012-07-07 ENCOUNTER — Encounter: Payer: Self-pay | Admitting: Infectious Disease

## 2012-07-07 ENCOUNTER — Ambulatory Visit (INDEPENDENT_AMBULATORY_CARE_PROVIDER_SITE_OTHER): Payer: Medicaid Other | Admitting: Infectious Disease

## 2012-07-07 ENCOUNTER — Telehealth: Payer: Self-pay | Admitting: Infectious Disease

## 2012-07-07 VITALS — BP 131/79 | HR 55 | Temp 98.4°F | Ht 68.0 in | Wt 136.0 lb

## 2012-07-07 DIAGNOSIS — M25529 Pain in unspecified elbow: Secondary | ICD-10-CM

## 2012-07-07 DIAGNOSIS — B20 Human immunodeficiency virus [HIV] disease: Secondary | ICD-10-CM

## 2012-07-07 DIAGNOSIS — M25521 Pain in right elbow: Secondary | ICD-10-CM

## 2012-07-07 DIAGNOSIS — S2239XA Fracture of one rib, unspecified side, initial encounter for closed fracture: Secondary | ICD-10-CM | POA: Insufficient documentation

## 2012-07-07 DIAGNOSIS — L299 Pruritus, unspecified: Secondary | ICD-10-CM

## 2012-07-07 DIAGNOSIS — R599 Enlarged lymph nodes, unspecified: Secondary | ICD-10-CM

## 2012-07-07 DIAGNOSIS — R591 Generalized enlarged lymph nodes: Secondary | ICD-10-CM

## 2012-07-07 DIAGNOSIS — R590 Localized enlarged lymph nodes: Secondary | ICD-10-CM

## 2012-07-07 DIAGNOSIS — Z21 Asymptomatic human immunodeficiency virus [HIV] infection status: Secondary | ICD-10-CM

## 2012-07-07 DIAGNOSIS — C73 Malignant neoplasm of thyroid gland: Secondary | ICD-10-CM

## 2012-07-07 DIAGNOSIS — R634 Abnormal weight loss: Secondary | ICD-10-CM

## 2012-07-07 LAB — COMPLETE METABOLIC PANEL WITH GFR
ALT: 16 U/L (ref 0–35)
AST: 22 U/L (ref 0–37)
Calcium: 10 mg/dL (ref 8.4–10.5)
Chloride: 106 mEq/L (ref 96–112)
Creat: 1.09 mg/dL (ref 0.50–1.10)

## 2012-07-07 LAB — T3: T3, Total: 52.8 ng/dL — ABNORMAL LOW (ref 80.0–204.0)

## 2012-07-07 LAB — T4, FREE: Free T4: 0.91 ng/dL (ref 0.80–1.80)

## 2012-07-07 MED ORDER — OXYCODONE-ACETAMINOPHEN 10-325 MG PO TABS: 1.0000 | ORAL_TABLET | Freq: Three times a day (TID) | ORAL | Status: DC | PRN

## 2012-07-07 MED ORDER — HYDROXYZINE HCL 25 MG PO TABS: 25.0000 mg | ORAL_TABLET | Freq: Three times a day (TID) | ORAL | Status: DC | PRN

## 2012-07-07 MED ORDER — OXYCODONE-ACETAMINOPHEN 10-325 MG PO TABS: 1.0000 | ORAL_TABLET | Freq: Three times a day (TID) | ORAL | Status: AC | PRN

## 2012-07-07 NOTE — Assessment & Plan Note (Signed)
Could be due to over replacmetn of thyroid. She also has LA in neck on CT scan. May need repeat scan in case this might be malignancy either lymphoma and also keeping in mind thyroid cancer hx.

## 2012-07-07 NOTE — Assessment & Plan Note (Signed)
Excellent control.   

## 2012-07-07 NOTE — Telephone Encounter (Signed)
TSH is higher than I have ever seen, t4 normal t3 low.

## 2012-07-07 NOTE — Assessment & Plan Note (Signed)
Seen on CT scan in axilla, cervical area as well as infrahilar regions. She could have Immune reconstitution but due have some anxiety for lymphoma or thyroid cancer recurrence. Rechecking tfts and may need repeat CT neck at minimum

## 2012-07-07 NOTE — Assessment & Plan Note (Signed)
?   Really fractures or soft tissue injury. Should have resolved by now. Again may need repeat imaging
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
## 2012-07-07 NOTE — Patient Instructions (Addendum)
You need to be seen by a primary care physician in addition to Dr. Daiva Eves  Fu with Dr. Daiva Eves in 3 months with blood work before that visit  Blood work today as well

## 2012-07-07 NOTE — Assessment & Plan Note (Signed)
chect TFTs.

## 2012-07-07 NOTE — Assessment & Plan Note (Signed)
Give percocet rx. May need to reimage

## 2012-07-12 ENCOUNTER — Telehealth: Payer: Self-pay | Admitting: Infectious Disease

## 2012-07-12 ENCOUNTER — Encounter: Payer: Self-pay | Admitting: *Deleted

## 2012-07-12 DIAGNOSIS — C73 Malignant neoplasm of thyroid gland: Secondary | ICD-10-CM

## 2012-07-12 NOTE — Progress Notes (Signed)
Patient ID: VAUGHN FRIEZE, female   DOB: 15-Jul-1951, 61 y.o.   MRN: [id number]  Pt needing appointment for endocrinologist. Have called [facility] [phone]) and left message with referral coordinator.   Pt [place] with [facility]. Have received authorization for 6 mos however pt will need to find another PCP since [facility] has closed.   Waiting for [facility] to call me back. I did relay the message that she has hx of thyroid ca. Pt requesting afternoon appt.  Tacey Heap RN  [phone]

## 2012-07-12 NOTE — Telephone Encounter (Signed)
Tamika can we get MRS Mcaleer IN TO SEE ENDOCRINE HER TSH IS HIGHEST I HAVE EVER SEEN. THIS COULD BE ALSO CONCERNING FOR RECURRENCE OF HER THYROID CANCER

## 2012-07-12 NOTE — Progress Notes (Signed)
Patient ID: [name], female   DOB: [birth year], 61 y.o.   MRN: [id number]  [facility] [phone]) and pt has appt with Dr. Everardo All on July 27, 2012 @ 2:30 pm. Called pt and sent letter as reminder. Tacey Heap RN

## 2012-07-15 ENCOUNTER — Other Ambulatory Visit: Payer: Self-pay | Admitting: Infectious Disease

## 2012-07-16 ENCOUNTER — Telehealth: Payer: Self-pay | Admitting: *Deleted

## 2012-07-16 NOTE — Telephone Encounter (Signed)
Pharmacist from [facility] called and advised that the patient was there with a Rx given 07/07/12 for Percocet trying to have it filled. She advised that the patient last had the same Rx filled 06/24/12 for 90 tablets by Dr Andrey Campanile at [facility]. She advised that they can not to refill unless they get approval from [place]. Advised her not to fill the Rx until it is time around 07/25/12.   Patient called just as I ended the call with pharmacy. She advised that she has [facility] and they will not fill the medication and asked if she could have a new Rx. Advised her no new Rx she will have to wait until the 07/25/12. The patient was very aggravated and said "Shit, I need my medicine. What am I supposed to do!" Advised her all she can do is wait there is nothing we can do the rules are the what they are. Also advised her will inform the doctor of this situation.

## 2012-07-19 NOTE — Telephone Encounter (Signed)
I need to run a narcotics data base on this pt.  She clearly SHOULD NOT GET NEW RX based on what u are showing here Is her PCP Dr. Andrey Campanile? Did she sign narcotics contract with [place]?

## 2012-07-20 ENCOUNTER — Other Ambulatory Visit: Payer: Self-pay | Admitting: Infectious Disease

## 2012-07-20 ENCOUNTER — Ambulatory Visit (HOSPITAL_COMMUNITY)
Admission: RE | Admit: 2012-07-20 | Discharge: 2012-07-20 | Disposition: A | Discharge status: Home / Self Care | Payer: Medicaid Other | Source: Ambulatory Visit | Attending: Infectious Disease | Admitting: Infectious Disease

## 2012-07-20 DIAGNOSIS — Z21 Asymptomatic human immunodeficiency virus [HIV] infection status: Secondary | ICD-10-CM | POA: Insufficient documentation

## 2012-07-20 DIAGNOSIS — R634 Abnormal weight loss: Secondary | ICD-10-CM | POA: Insufficient documentation

## 2012-07-20 DIAGNOSIS — R591 Generalized enlarged lymph nodes: Secondary | ICD-10-CM

## 2012-07-20 DIAGNOSIS — M4802 Spinal stenosis, cervical region: Secondary | ICD-10-CM | POA: Insufficient documentation

## 2012-07-20 DIAGNOSIS — R599 Enlarged lymph nodes, unspecified: Secondary | ICD-10-CM | POA: Insufficient documentation

## 2012-07-20 DIAGNOSIS — Z85850 Personal history of malignant neoplasm of thyroid: Secondary | ICD-10-CM | POA: Insufficient documentation

## 2012-07-20 MED ORDER — IOHEXOL 300 MG/ML  SOLN
75.0000 mL | Freq: Once | INTRAMUSCULAR | Status: AC | PRN
  Administered 2012-07-20: 75 mL via INTRAVENOUS

## 2012-07-27 ENCOUNTER — Ambulatory Visit: Payer: Medicaid Other | Admitting: Endocrinology

## 2012-07-27 DIAGNOSIS — Z0289 Encounter for other administrative examinations: Secondary | ICD-10-CM

## 2012-07-28 ENCOUNTER — Ambulatory Visit (INDEPENDENT_AMBULATORY_CARE_PROVIDER_SITE_OTHER): Payer: Medicaid Other | Admitting: Endocrinology

## 2012-07-28 ENCOUNTER — Encounter: Payer: Self-pay | Admitting: Endocrinology

## 2012-07-28 ENCOUNTER — Other Ambulatory Visit (INDEPENDENT_AMBULATORY_CARE_PROVIDER_SITE_OTHER): Payer: Medicaid Other

## 2012-07-28 VITALS — BP 102/72 | HR 64 | Temp 97.3°F | Ht 68.5 in | Wt 137.0 lb

## 2012-07-28 DIAGNOSIS — E890 Postprocedural hypothyroidism: Secondary | ICD-10-CM

## 2012-07-28 DIAGNOSIS — C73 Malignant neoplasm of thyroid gland: Secondary | ICD-10-CM

## 2012-07-28 MED ORDER — LEVOTHYROXINE SODIUM 150 MCG PO TABS: 150.0000 ug | ORAL_TABLET | Freq: Every day | ORAL | Status: DC

## 2012-07-28 NOTE — Progress Notes (Signed)
Subjective:    Patient ID: [name], female    DOB: [birth year], 61 y.o.   MRN: [id number]  HPI 4 mos ago, pt had suspicious thyroid bx result.  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  Margins and nodes were neg.  She still has slight swelling at the anterior neck, and assoc pain, but she says this is better over the past few weeks.  She says she was on synthroid at the time of the tsh a few weeks ago, and she insists she takes it as prescribed. Past Medical History  Diagnosis Date  . Arthritis   . COPD (chronic obstructive pulmonary disease)   . Lipoma     right forearm  . Headache   . Anxiety   . Depression   . HIV (human immunodeficiency virus infection)     "dx'd ~ 12/2011"  . Chronic bronchitis   . Pneumonia ~ 2000's    "once"  . Shortness of breath on exertion   . 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 Fibromyalgia     "I think I got that"  . Thyroid cancer s/p total thyroidectomy April2013 02/06/2012    Per patient report.  Recently diagnosed. Awaiting procedure to have thyroid removed.  Under treatment with Dr Jearld Fenton .   Marland Kitchen Asthma     Past Surgical History  Procedure Date  . Thyroidectomy 03/11/12    bilaterally  . Anterior cervical decomp/discectomy fusion 05/2009    C3-4; C5-6  . Dilation and curettage of uterus   . Thyroidectomy 03/11/2012    Procedure: THYROIDECTOMY;  Surgeon: Suzanna Obey, MD;  Location: [facility] OR;  Service: ENT;  Laterality: Bilateral;    History   Social History  . Marital Status: Divorced    Spouse Name: N/A    Number of Children: N/A  . Years of Education: 12   Occupational History  . Not on file.   Social History Main Topics  . Smoking status: Current Everyday Smoker -- 2.0 packs/day for 47 years    Types: Cigarettes  . Smokeless tobacco: Former Neurosurgeon    Quit date: 12/01/1974   Comment: currently smokes   . Alcohol Use: Yes     03/11/12 "no beer or wine ~ 01/2012"  . Drug  Use: 1 per week    Special: Marijuana, Cocaine     03/11/12 "last drug use early /2013"  . Sexually Active: Not Currently -- Female partner(s)    Birth Control/ Protection: None, Post-menopausal   Other Topics Concern  . Not on file   Social History Narrative   Regular exercise-yes (walking)Caffeine Use-yes    Current Outpatient Prescriptions on File Prior to Visit  Medication Sig Dispense Refill  . albuterol (PROVENTIL HFA;VENTOLIN HFA) 108 (90 BASE) MCG/ACT inhaler Inhale 2 puffs into the lungs every 4 (four) hours as needed. For shortness of breath      . atazanavir (REYATAZ) 300 MG capsule Take 1 capsule (300 mg total) by mouth daily with breakfast.  30 capsule  11  . citalopram (CELEXA) 20 MG tablet Take 1 tablet (20 mg total) by mouth daily.  30 tablet  11  . emtricitabine-tenofovir (TRUVADA) 200-300 MG per tablet Take 1 tablet by mouth daily.  30 tablet  11  . hydrOXYzine (ATARAX/VISTARIL) 25 MG tablet Take 1 tablet (25 mg total) by mouth 3 (three) times daily as needed.  30 tablet  2  . levothyroxine (SYNTHROID, LEVOTHROID) 125 MCG tablet Take 1 tablet (  125 mcg total) by mouth daily.   30 tablet  1  . meloxicam (MOBIC) 15 MG tablet Take 15 mg by mouth daily.      . Multiple Vitamin (MULITIVITAMIN WITH MINERALS) TABS Take 1 tablet by mouth daily.      . ritonavir (NORVIR) 100 MG capsule Take 1 capsule (100 mg total) by mouth daily.  30 capsule  11  . clonazePAM (KLONOPIN) 0.5 MG tablet Take 1 tablet (0.5 mg total) by mouth at bedtime as needed for anxiety.  30 tablet  4  . ENSURE (ENSURE) Take 237 mLs by mouth 3 (three) times daily between meals.  237 mL  6  . ibuprofen (ADVIL,MOTRIN) 200 MG tablet Take 4 tablets (800 mg total) by mouth 3 (three) times daily with meals. For pain        Allergies  Allergen Reactions  . Sulfa Antibiotics Hives and Other (See Comments)    "in my shoulders; legs; feet; made me burn all over"    Family History  Problem Relation Age of Onset  .  Heart disease Maternal Uncle   no thyroid probs  BP 82/62  Pulse 64  Temp 97.3 F (36.3 C) (Oral)  Ht 5' 8.5" (1.74 m)  Wt 137 lb (62.143 kg)  BMI 20.53 kg/m2  SpO2 97%    Review of Systems denies double vision, palpitations, sob, diarrhea, polyuria, tremor, menopausal sxs, LOC,and rhinorrhea She reports weight loss, hoarseness, myalgias, excessive diaphoresis, depression, easy bruising, numbness of the feet, and headache. She has slight if any dizziness    Objective:   Physical Exam VS: see vs page GEN: no distress HEAD: head: no deformity eyes: no periorbital swelling, no proptosis external nose and ears are normal mouth: no lesion seen Neck: a healed scar is present.  i do not appreciate a nodule in the thyroid or elsewhere in the neck.   CHEST WALL: no deformity LUNGS:  Clear to auscultation CV: reg rate and rhythm, no murmur ABD: abdomen is soft, nontender.  no hepatosplenomegaly.  not distended.  no hernia. MUSCULOSKELETAL: muscle bulk and strength are grossly normal.  no obvious joint swelling.  gait is normal and steady EXTEMITIES: no deformity.  no ulcer on the feet.  feet are of normal color and temp.  no edema.  There is bilateral onychomycosis.   PULSES: dorsalis pedis intact bilat.  no carotid bruit NEURO:  cn 2-12 grossly intact.   readily moves all 4's.  sensation is intact to touch on the feet SKIN:  Normal texture and temperature.  No rash or suspicious lesion is visible.   NODES:  None palpable at the neck PSYCH: alert, oriented x3.  Does not appear anxious nor depressed.   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 Lymphadenopathy at the left anterior neck, seen on ct.  Given that this is the opposite side from the cancer (small and differentiated), this is unlikely to  be thyroid cancer-related, and should be evaluated separately. Transient hypotension, with minimal if any sxs.  In the setting of HIV, i would have a low threshold to rx with florinef.  i would be happy to call pt back in for acth stim test if requested. Weight loss, ? Etiol.  Not related to thyroid Postsurgical hypothyroidism, needs increased rx.  ?  Compliance.

## 2012-07-28 NOTE — Patient Instructions (Addendum)
Let's plan to do the radioactive iodine pill in late October.  We'll set it up next time.   blood tests are being requested for you today.  You will receive a letter with results. Please come back for a follow-up appointment in 1 month.

## 2012-08-30 ENCOUNTER — Ambulatory Visit: Payer: Medicaid Other | Admitting: Endocrinology

## 2012-08-30 DIAGNOSIS — Z0289 Encounter for other administrative examinations: Secondary | ICD-10-CM

## 2012-09-02 ENCOUNTER — Other Ambulatory Visit: Payer: Self-pay | Admitting: Endocrinology

## 2012-09-02 NOTE — Telephone Encounter (Signed)
Med filled on 07/28/12. Pt last seen on 07/28/12.

## 2012-09-07 ENCOUNTER — Ambulatory Visit (INDEPENDENT_AMBULATORY_CARE_PROVIDER_SITE_OTHER): Payer: Medicaid Other | Admitting: Endocrinology

## 2012-09-07 ENCOUNTER — Encounter: Payer: Self-pay | Admitting: Endocrinology

## 2012-09-07 VITALS — BP 130/82 | HR 78 | Temp 98.1°F | Wt 142.0 lb

## 2012-09-07 DIAGNOSIS — E890 Postprocedural hypothyroidism: Secondary | ICD-10-CM

## 2012-09-07 NOTE — Progress Notes (Signed)
Subjective:    Patient ID: [name], female    DOB: October 07, 1951, 61 y.o.   MRN: [id number]  HPI In early 2013, pt had suspicious thyroid bx result.  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  Margins and nodes were neg.   Pt says she still has cold intolerance, despite increasing the synthroid to 150 mcg/day.  She says she never misses it.   Past Medical History  Diagnosis Date  . Arthritis   . COPD (chronic obstructive pulmonary disease)   . Lipoma     right forearm  . Headache   . Anxiety   . Depression   . HIV (human immunodeficiency virus infection)     "dx'd ~ 12/2011"  . Chronic bronchitis   . Pneumonia ~ 2000's    "once"  . Shortness of breath on exertion   . 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 Fibromyalgia     "I think I got that"  . Thyroid cancer s/p total thyroidectomy April2013 02/06/2012    Per patient report.  Recently diagnosed. Awaiting procedure to have thyroid removed.  Under treatment with Dr Jearld Fenton .   Marland Kitchen Asthma     Past Surgical History  Procedure Date  . Thyroidectomy 03/11/12    bilaterally  . Anterior cervical decomp/discectomy fusion 05/2009    C3-4; C5-6  . Dilation and curettage of uterus   . Thyroidectomy 03/11/2012    Procedure: THYROIDECTOMY;  Surgeon: Suzanna Obey, MD;  Location: [facility] OR;  Service: ENT;  Laterality: Bilateral;    History   Social History  . Marital Status: Divorced    Spouse Name: N/A    Number of Children: N/A  . Years of Education: 12   Occupational History  . Not on file.   Social History Main Topics  . Smoking status: Current Every Day Smoker -- 1.0 packs/day for 47 years    Types: Cigarettes  . Smokeless tobacco: Former Neurosurgeon    Quit date: 12/01/1974   Comment: currently smokes   . Alcohol Use: Yes     03/11/12 "no beer or wine ~ 01/2012"  . Drug Use: 1 per week    Special: Marijuana, Cocaine     03/11/12 "last drug use early /2013"  . Sexually Active:  Not Currently -- Female partner(s)    Birth Control/ Protection: None, Post-menopausal   Other Topics Concern  . Not on file   Social History Narrative   Regular exercise-yes (walking)Caffeine Use-yes    Current Outpatient Prescriptions on File Prior to Visit  Medication Sig Dispense Refill  . albuterol (PROVENTIL HFA;VENTOLIN HFA) 108 (90 BASE) MCG/ACT inhaler Inhale 2 puffs into the lungs every 4 (four) hours as needed. For shortness of breath      . atazanavir (REYATAZ) 300 MG capsule Take 1 capsule (300 mg total) by mouth daily with breakfast.  30 capsule  11  . citalopram (CELEXA) 20 MG tablet Take 1 tablet (20 mg total) by mouth daily.  30 tablet  11  . clonazePAM (KLONOPIN) 0.5 MG tablet Take 1 tablet (0.5 mg total) by mouth at bedtime as needed for anxiety.  30 tablet  4  . emtricitabine-tenofovir (TRUVADA) 200-300 MG per tablet Take 1 tablet by mouth daily.  30 tablet  11  . ENSURE (ENSURE) Take 237 mLs by mouth 3 (three) times daily between meals.  237 mL  6  . hydrOXYzine (ATARAX/VISTARIL) 25 MG tablet Take 1 tablet (25 mg total) by  mouth 3 (three) times daily as needed.  30 tablet  2  . ibuprofen (ADVIL,MOTRIN) 200 MG tablet Take 4 tablets (800 mg total) by mouth 3 (three) times daily with meals. For pain      . meloxicam (MOBIC) 15 MG tablet Take 15 mg by mouth daily.      . Multiple Vitamin (MULITIVITAMIN WITH MINERALS) TABS Take 1 tablet by mouth daily.      Marland Kitchen oxyCODONE (OXYCONTIN) 10 MG 12 hr tablet Take 10 mg by mouth every 12 (twelve) hours.      . ritonavir (NORVIR) 100 MG capsule Take 1 capsule (100 mg total) by mouth daily.  30 capsule  11    Allergies  Allergen Reactions  . Sulfa Antibiotics Hives and Other (See Comments)    "in my shoulders; legs; feet; made me burn all over"    Family History  Problem Relation Age of Onset  . Heart disease Maternal Uncle     BP 130/82  Pulse 78  Temp 98.1 F (36.7 C) (Oral)  Wt 142 lb (64.411 kg)  SpO2 97%    Review  of Systems Denies weight change.    Objective:   Physical Exam VITAL SIGNS:  See vs page GENERAL: no distress Neck: a healed scar is present.  i do not appreciate a nodule in the thyroid or elsewhere in the neck.   Lab Results  Component Value Date   TSH 21.087* 09/07/2012   T3TOTAL 52.8* 07/07/2012      Assessment & Plan:  Post i-131 rx; not yet ready for adjuvant i-131 rx

## 2012-09-07 NOTE — Patient Instructions (Addendum)
please call [phone] ([facility] physician referral line), to get an appointment with a new primary doctor.   Please stop the levothyroxine pill for now. blood tests are being requested for you today.  You will be contacted with results. When your thyroid level is low enough, i'll order the radioactive iodine pill for you.  It is given at x-ray.

## 2012-09-08 LAB — TSH: TSH: 21.087 u[IU]/mL — ABNORMAL HIGH (ref 0.350–4.500)

## 2012-09-10 ENCOUNTER — Other Ambulatory Visit: Payer: Self-pay | Admitting: Endocrinology

## 2012-09-10 ENCOUNTER — Other Ambulatory Visit: Payer: Self-pay | Admitting: Infectious Disease

## 2012-09-10 ENCOUNTER — Other Ambulatory Visit: Payer: Self-pay | Admitting: Licensed Clinical Social Worker

## 2012-09-10 DIAGNOSIS — M549 Dorsalgia, unspecified: Secondary | ICD-10-CM

## 2012-09-10 DIAGNOSIS — E890 Postprocedural hypothyroidism: Secondary | ICD-10-CM

## 2012-09-10 MED ORDER — OXYCODONE HCL 10 MG PO TB12: 10.0000 mg | ORAL_TABLET | Freq: Two times a day (BID) | ORAL | Status: DC

## 2012-09-27 ENCOUNTER — Other Ambulatory Visit: Payer: Medicaid Other

## 2012-10-04 ENCOUNTER — Ambulatory Visit: Payer: Medicaid Other | Admitting: Infectious Disease

## 2012-10-07 ENCOUNTER — Telehealth: Payer: Self-pay | Admitting: Licensed Clinical Social Worker

## 2012-10-07 DIAGNOSIS — M542 Cervicalgia: Secondary | ICD-10-CM

## 2012-10-07 NOTE — Telephone Encounter (Signed)
Patient called about her rx for oxycontin, she has to have prior approval. I would like to know if you meant for her to have this rx? It was printed and handed to her on 09/10/2012, I thought she wasn't supposed to get anymore pain medication. Please advise

## 2012-10-07 NOTE — Telephone Encounter (Signed)
I DIDN'T SEE THE PATIEN on 09/10/12. Last script I wrote was for percocet in August and this wasn't filled because someone else had just given her 90 tablet sof percocet ([facility])  She needs narcotics database run on her

## 2012-10-08 ENCOUNTER — Other Ambulatory Visit: Payer: Self-pay | Admitting: Licensed Clinical Social Worker

## 2012-10-08 DIAGNOSIS — S2239XA Fracture of one rib, unspecified side, initial encounter for closed fracture: Secondary | ICD-10-CM

## 2012-10-08 MED ORDER — OXYCODONE-ACETAMINOPHEN 5-325 MG PO TABS: 1.0000 | ORAL_TABLET | Freq: Four times a day (QID) | ORAL | Status: DC | PRN

## 2012-10-08 NOTE — Telephone Encounter (Signed)
I called to try and do an prior authorization on her narcotic rx and this is not a preferred drug, Dr. Daiva Eves will not prescribe her any narcotics after this one anyway. I looked back in her history she used to take Percocet, Dr. Daiva Eves said it was ok to do percocet 5-325 mg 1 to 2 daily # 60.

## 2012-10-11 ENCOUNTER — Ambulatory Visit: Payer: Medicaid Other | Admitting: Infectious Disease

## 2012-10-12 ENCOUNTER — Other Ambulatory Visit: Payer: Self-pay | Admitting: *Deleted

## 2012-10-12 MED ORDER — LEVOTHYROXINE SODIUM 150 MCG PO TABS: 150.0000 ug | ORAL_TABLET | Freq: Every day | ORAL | Status: DC

## 2012-11-08 ENCOUNTER — Other Ambulatory Visit: Payer: Self-pay | Admitting: Internal Medicine

## 2012-11-08 DIAGNOSIS — C73 Malignant neoplasm of thyroid gland: Secondary | ICD-10-CM

## 2012-11-11 ENCOUNTER — Other Ambulatory Visit: Payer: Self-pay | Admitting: Licensed Clinical Social Worker

## 2012-11-11 DIAGNOSIS — L299 Pruritus, unspecified: Secondary | ICD-10-CM

## 2012-11-11 MED ORDER — HYDROXYZINE HCL 25 MG PO TABS: 25.0000 mg | ORAL_TABLET | Freq: Three times a day (TID) | ORAL | Status: DC | PRN

## 2012-11-16 ENCOUNTER — Telehealth: Payer: Self-pay | Admitting: Endocrinology

## 2012-11-16 DIAGNOSIS — E890 Postprocedural hypothyroidism: Secondary | ICD-10-CM

## 2012-11-16 NOTE — Telephone Encounter (Signed)
Patient wants to know when she should start taking her thyroid medication again?

## 2012-11-17 NOTE — Telephone Encounter (Signed)
Left message on machine pt to pick up order for labs

## 2012-11-17 NOTE — Telephone Encounter (Signed)
Please come in to repeat thyroid blood test

## 2012-11-22 ENCOUNTER — Telehealth: Payer: Self-pay | Admitting: *Deleted

## 2012-11-22 ENCOUNTER — Inpatient Hospital Stay: Admission: RE | Admit: 2012-11-22 | Payer: Medicaid Other | Source: Ambulatory Visit

## 2012-11-22 NOTE — Telephone Encounter (Signed)
Patient called and advised she needs her Oxycodone filled. She advised she needs her meds because she is in pain from her surgery from April. After review of the chart and speaking to CMA advised the patient that Dr Daiva Eves is not going to fill the Rx again and if she is still having pain she needs to see her PCP. She was not happy with this but she accepted it and hung up.

## 2012-11-29 ENCOUNTER — Other Ambulatory Visit (INDEPENDENT_AMBULATORY_CARE_PROVIDER_SITE_OTHER): Payer: Medicaid Other

## 2012-11-29 DIAGNOSIS — Z113 Encounter for screening for infections with a predominantly sexual mode of transmission: Secondary | ICD-10-CM

## 2012-11-29 DIAGNOSIS — B20 Human immunodeficiency virus [HIV] disease: Secondary | ICD-10-CM

## 2012-11-29 LAB — CBC WITH DIFFERENTIAL/PLATELET
Eosinophils Relative: 2 % (ref 0–5)
HCT: 38 % (ref 36.0–46.0)
Lymphocytes Relative: 34 % (ref 12–46)
Lymphs Abs: 1.6 10*3/uL (ref 0.7–4.0)
MCV: 91.8 fL (ref 78.0–100.0)
Monocytes Absolute: 0.3 10*3/uL (ref 0.1–1.0)
Neutro Abs: 2.6 10*3/uL (ref 1.7–7.7)
Platelets: 205 10*3/uL (ref 150–400)
RBC: 4.14 MIL/uL (ref 3.87–5.11)
WBC: 4.6 10*3/uL (ref 4.0–10.5)

## 2012-11-29 LAB — COMPLETE METABOLIC PANEL WITH GFR
AST: 33 U/L (ref 0–37)
Alkaline Phosphatase: 67 U/L (ref 39–117)
BUN: 15 mg/dL (ref 6–23)
Creat: 1.58 mg/dL — ABNORMAL HIGH (ref 0.50–1.10)
GFR, Est Non African American: 35 mL/min — ABNORMAL LOW

## 2012-11-29 LAB — LIPID PANEL
Cholesterol: 334 mg/dL — ABNORMAL HIGH (ref 0–200)
HDL: 41 mg/dL (ref >39)
LDL Cholesterol: 250 mg/dL — ABNORMAL HIGH (ref 0–99)
Triglycerides: 217 mg/dL — ABNORMAL HIGH (ref <150)
VLDL: 43 mg/dL — ABNORMAL HIGH (ref 0–40)

## 2012-11-30 LAB — HIV-1 RNA QUANT-NO REFLEX-BLD: HIV 1 RNA Quant: <20 copies/mL (ref <20)

## 2012-12-13 ENCOUNTER — Ambulatory Visit: Payer: Self-pay | Admitting: Infectious Disease

## 2012-12-13 ENCOUNTER — Ambulatory Visit
Admission: RE | Admit: 2012-12-13 | Discharge: 2012-12-13 | Disposition: A | Discharge status: Home / Self Care | Payer: Medicaid Other | Source: Ambulatory Visit | Attending: Infectious Disease | Admitting: Infectious Disease

## 2012-12-13 ENCOUNTER — Other Ambulatory Visit: Payer: Self-pay | Admitting: Infectious Disease

## 2012-12-13 ENCOUNTER — Ambulatory Visit (INDEPENDENT_AMBULATORY_CARE_PROVIDER_SITE_OTHER): Payer: Medicaid Other | Admitting: Infectious Disease

## 2012-12-13 ENCOUNTER — Encounter: Payer: Self-pay | Admitting: Infectious Disease

## 2012-12-13 VITALS — BP 110/73 | HR 58 | Temp 98.1°F | Wt 150.0 lb

## 2012-12-13 DIAGNOSIS — M25521 Pain in right elbow: Secondary | ICD-10-CM

## 2012-12-13 DIAGNOSIS — G8929 Other chronic pain: Secondary | ICD-10-CM

## 2012-12-13 DIAGNOSIS — M25529 Pain in unspecified elbow: Secondary | ICD-10-CM

## 2012-12-13 DIAGNOSIS — Z21 Asymptomatic human immunodeficiency virus [HIV] infection status: Secondary | ICD-10-CM

## 2012-12-13 DIAGNOSIS — B20 Human immunodeficiency virus [HIV] disease: Secondary | ICD-10-CM

## 2012-12-13 DIAGNOSIS — Z23 Encounter for immunization: Secondary | ICD-10-CM

## 2012-12-13 DIAGNOSIS — R0781 Pleurodynia: Secondary | ICD-10-CM

## 2012-12-13 DIAGNOSIS — C73 Malignant neoplasm of thyroid gland: Secondary | ICD-10-CM
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
## 2012-12-14 LAB — TSH: TSH: 49.072 u[IU]/mL — ABNORMAL HIGH (ref 0.350–4.500)

## 2012-12-14 LAB — T4: T4, Total: 1.6 ug/dL — ABNORMAL LOW (ref 5.0–12.5)

## 2012-12-21 ENCOUNTER — Other Ambulatory Visit: Payer: Self-pay | Admitting: Endocrinology

## 2012-12-21 DIAGNOSIS — C73 Malignant neoplasm of thyroid gland: Secondary | ICD-10-CM

## 2012-12-22 ENCOUNTER — Other Ambulatory Visit: Payer: Self-pay | Admitting: Endocrinology

## 2012-12-22 DIAGNOSIS — C73 Malignant neoplasm of thyroid gland: Secondary | ICD-10-CM

## 2012-12-23 ENCOUNTER — Other Ambulatory Visit: Payer: Self-pay | Admitting: Endocrinology

## 2012-12-23 ENCOUNTER — Other Ambulatory Visit: Payer: Self-pay

## 2012-12-23 DIAGNOSIS — C73 Malignant neoplasm of thyroid gland: Secondary | ICD-10-CM

## 2012-12-23 MED ORDER — LEVOTHYROXINE SODIUM 150 MCG PO TABS: 150.0000 ug | ORAL_TABLET | Freq: Every day | ORAL | Status: DC

## 2012-12-23 NOTE — Telephone Encounter (Signed)
Left message on machine per Dr. Everardo All please resume levothyroxine  5 days after iodine treatment refill request sent to the pharmacy.

## 2012-12-28 ENCOUNTER — Telehealth: Payer: Self-pay | Admitting: Endocrinology

## 2012-12-28 ENCOUNTER — Encounter: Payer: Self-pay | Admitting: Endocrinology

## 2012-12-28 ENCOUNTER — Ambulatory Visit (INDEPENDENT_AMBULATORY_CARE_PROVIDER_SITE_OTHER): Payer: Medicaid Other | Admitting: Endocrinology

## 2012-12-28 ENCOUNTER — Encounter (HOSPITAL_COMMUNITY): Payer: Medicaid Other

## 2012-12-28 VITALS — BP 126/80 | HR 78 | Wt 153.0 lb

## 2012-12-28 DIAGNOSIS — E890 Postprocedural hypothyroidism: Secondary | ICD-10-CM

## 2012-12-28 NOTE — Progress Notes (Signed)
Subjective:    Patient ID: [name], female    DOB: [birth year], 62 y.o.   MRN: [id number]  HPI In early 2013, pt had suspicious thyroid bx result.  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  Margins and nodes were neg.  Her main symptom is diffuse myalgias and arthralgias.  When she had TFT done 2 weeks ago, she was off the synthroid.   Past Medical History  Diagnosis Date  . Arthritis   . COPD (chronic obstructive pulmonary disease)   . Lipoma     right forearm  . Headache   . Anxiety   . Depression   . HIV (human immunodeficiency virus infection)     "dx'd ~ 12/2011"  . Chronic bronchitis   . Pneumonia ~ 2000's    "once"  . Shortness of breath on exertion   . 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 Fibromyalgia     "I think I got that"  . Thyroid cancer s/p total thyroidectomy April2013 02/06/2012    Per patient report.  Recently diagnosed. Awaiting procedure to have thyroid removed.  Under treatment with Dr Jearld Fenton .   Marland Kitchen Asthma     Past Surgical History  Procedure Date  . Thyroidectomy 03/11/12    bilaterally  . Anterior cervical decomp/discectomy fusion 05/2009    C3-4; C5-6  . Dilation and curettage of uterus   . Thyroidectomy 03/11/2012    Procedure: THYROIDECTOMY;  Surgeon: Suzanna Obey, MD;  Location: [facility] OR;  Service: ENT;  Laterality: Bilateral;    History   Social History  . Marital Status: Divorced    Spouse Name: N/A    Number of Children: N/A  . Years of Education: 12   Occupational History  . Not on file.   Social History Main Topics  . Smoking status: Current Every Day Smoker -- 1.0 packs/day for 47 years    Types: Cigarettes  . Smokeless tobacco: Former Neurosurgeon    Quit date: 12/01/1974     Comment: currently smokes   . Alcohol Use: Yes     Comment: 03/11/12 "no beer or wine ~ 01/2012"  . Drug Use: 1 per week    Special: Marijuana, Cocaine     Comment: 03/11/12 "last drug use early /2013"  .  Sexually Active: Not Currently -- Female partner(s)    Birth Control/ Protection: None, Post-menopausal   Other Topics Concern  . Not on file   Social History Narrative   Regular exercise-yes (walking)Caffeine Use-yes    Current Outpatient Prescriptions on File Prior to Visit  Medication Sig Dispense Refill  . albuterol (PROVENTIL HFA;VENTOLIN HFA) 108 (90 BASE) MCG/ACT inhaler Inhale 2 puffs into the lungs every 4 (four) hours as needed. For shortness of breath      . atazanavir (REYATAZ) 300 MG capsule Take 1 capsule (300 mg total) by mouth daily with breakfast.  30 capsule  11  . citalopram (CELEXA) 20 MG tablet Take 1 tablet (20 mg total) by mouth daily.  30 tablet  11  . clonazePAM (KLONOPIN) 0.5 MG tablet Take 1 tablet (0.5 mg total) by mouth at bedtime as needed for anxiety.  30 tablet  4  . emtricitabine-tenofovir (TRUVADA) 200-300 MG per tablet Take 1 tablet by mouth daily.  30 tablet  11  . ENSURE (ENSURE) Take 237 mLs by mouth 3 (three) times daily between meals.  237 mL  6  . hydrOXYzine (ATARAX/VISTARIL) 25 MG tablet Take 1 tablet (  25 mg total) by mouth every 8 (eight) hours as needed for itching.  30 tablet  1  . ibuprofen (ADVIL,MOTRIN) 200 MG tablet Take 4 tablets (800 mg total) by mouth 3 (three) times daily with meals. For pain      . levothyroxine (SYNTHROID, LEVOTHROID) 150 MCG tablet Take 1 tablet (150 mcg total) by mouth daily.  90 tablet  3  . meloxicam (MOBIC) 15 MG tablet Take 15 mg by mouth daily.      . Multiple Vitamin (MULITIVITAMIN WITH MINERALS) TABS Take 1 tablet by mouth daily.      Marland Kitchen oxyCODONE (OXYCONTIN) 10 MG 12 hr tablet Take 1 tablet (10 mg total) by mouth every 12 (twelve) hours.  60 tablet  0  . oxyCODONE-acetaminophen (PERCOCET/ROXICET) 5-325 MG per tablet Take 1 tablet by mouth every 6 (six) hours as needed for pain.  120 tablet  0  . ritonavir (NORVIR) 100 MG capsule Take 1 capsule (100 mg total) by mouth daily.  30 capsule  11    Allergies    Allergen Reactions  . Sulfa Antibiotics Hives and Other (See Comments)    "in my shoulders; legs; feet; made me burn all over"    Family History  Problem Relation Age of Onset  . Heart disease Maternal Uncle     BP 126/80  Pulse 78  Wt 153 lb (69.4 kg)  SpO2 95%  Review of Systems She has gained a few lbs.    Objective:   Physical Exam VITAL SIGNS:  See vs page GENERAL: no distress. eyes: slight periorbital swelling Neck: a healed scar is present.  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  i'll do the best i can.

## 2012-12-28 NOTE — Patient Instructions (Addendum)
Please take the thyroid pill daily.  You will need this for the rest of your life.   Please come back for a follow-up appointment in 1 month.    Hypothyroidism The thyroid is a large gland located in the lower front of your neck. The thyroid gland helps control metabolism. Metabolism is how your body handles food. It controls metabolism with the hormone thyroxine. When this gland is underactive (hypothyroid), it produces too little hormone.   CAUSE   Removal of your thyroid SYMPTOMS  Lethargy (feeling as though you have no energy)   Cold intolerance   Weight gain (in spite of normal food intake)   Dry skin   Coarse hair   Menstrual irregularity (if severe, may lead to infertility)   Slowing of thought processes  Cardiac problems are also caused by insufficient amounts of thyroid hormone. Hypothyroidism in the newborn is cretinism, and is an extreme form. It is important that this form be treated adequately and immediately or it will lead rapidly to retarded physical and mental development. DIAGNOSIS   To prove hypothyroidism, your caregiver may do blood tests and ultrasound tests. Sometimes the signs are hidden. It may be necessary for your caregiver to watch this illness with blood tests either before or after diagnosis and treatment. TREATMENT   Low levels of thyroid hormone are increased by using synthetic thyroid hormone. This is a safe, effective treatment. It usually takes about four weeks to gain the full effects of the medication. After you have the full effect of the medication, it will generally take another four weeks for problems to leave. Your caregiver may start you on low doses. If you have had heart problems the dose may be gradually increased. It is generally not an emergency to get rapidly to normal. HOME CARE INSTRUCTIONS    Take your medications as your caregiver suggests. Let your caregiver know of any medications you are taking or start taking. Your caregiver will  help you with dosage schedules.   As your condition improves, your dosage needs may increase. It will be necessary to have continuing blood tests as suggested by your caregiver.   Report all suspected medication side effects to your caregiver.  SEEK MEDICAL CARE IF: Seek medical care if you develop:  Sweating.   Tremulousness (tremors).   Anxiety.   Rapid weight loss.   Heat intolerance.   Emotional swings.   Diarrhea.   Weakness.  SEEK IMMEDIATE MEDICAL CARE IF:   You develop chest pain, an irregular heart beat (palpitations), or a rapid heart beat. MAKE SURE YOU:    Understand these instructions.   Will watch your condition.   Will get help right away if you are not doing well or get worse.  Document Released: 11/17/2005 Document Revised: 02/09/2012 Document Reviewed: 07/07/2008 [facility] Patient Information 2013 [place], [place].

## 2012-12-28 NOTE — Telephone Encounter (Signed)
Pt was scheduled for RAI Therapy for 12/28/12 @ 1pm and post scan for 01/07/13 @ 8am. Appt cancelled unable to reach. Left pt several messages with no return call.

## 2013-01-07 ENCOUNTER — Encounter (HOSPITAL_COMMUNITY): Payer: Medicaid Other

## 2013-01-18 ENCOUNTER — Telehealth: Payer: Self-pay | Admitting: Endocrinology

## 2013-01-18 NOTE — Telephone Encounter (Signed)
Pt said at last ov that at the time of the blood test, she had been off the synthroid.  Please redo the blood test when you have been back on it x 4 weeks.  i would be happy to order for you.

## 2013-01-18 NOTE — Telephone Encounter (Signed)
Pt was advised per Dr. George Hugh message and states an understanding and that she will call us back when she has been on the med for 4 weeks

## 2013-01-18 NOTE — Telephone Encounter (Signed)
The patient called to report that [facility] mail order pharmacy does not have record of her thyroid medication that Dr. Everardo All was "supposed to change. He was going to up my thyroid medicine".  The patient stated that the blood work done by Dr. Daiva Eves showed her levels were low.  The patient requests medication sent to [facility] and a return call from "Dr. George Hugh nurse to talk about my meds". The patient may be reached at [phone].

## 2013-01-26 ENCOUNTER — Ambulatory Visit: Payer: Medicaid Other | Admitting: Endocrinology

## 2013-02-11 ENCOUNTER — Telehealth: Payer: Self-pay | Admitting: Endocrinology

## 2013-02-11 NOTE — Telephone Encounter (Signed)
Pt advised and needs an appointment

## 2013-02-11 NOTE — Telephone Encounter (Signed)
Also, ov is due

## 2013-02-11 NOTE — Telephone Encounter (Signed)
The patient called to request a "weight gaining" medication.  The patient states "she needs to gain her weight back".  Please advise patient on course of action.  The patient may be reached at [phone].

## 2013-02-11 NOTE — Telephone Encounter (Signed)
i am unaware of any such medication.

## 2013-03-01 ENCOUNTER — Encounter: Payer: Self-pay | Admitting: *Deleted

## 2013-03-08 ENCOUNTER — Telehealth: Payer: Self-pay | Admitting: Endocrinology

## 2013-03-08 NOTE — Telephone Encounter (Signed)
Pt is due an OV with Dr. Everardo All. LM for pt to call and schedule appt. LM on mobile # b/c home number is not working. Awaiting return call from patient / Sherri S.

## 2013-03-13 ENCOUNTER — Emergency Department (HOSPITAL_COMMUNITY): Payer: Medicaid Other

## 2013-03-13 ENCOUNTER — Encounter (HOSPITAL_COMMUNITY): Payer: Self-pay | Admitting: Vascular Surgery

## 2013-03-13 ENCOUNTER — Emergency Department (HOSPITAL_COMMUNITY)
Admission: EM | Admit: 2013-03-13 | Discharge: 2013-03-13 | Disposition: A | Discharge status: Home / Self Care | Payer: Medicaid Other | Attending: Emergency Medicine | Admitting: Emergency Medicine

## 2013-03-13 DIAGNOSIS — Z8709 Personal history of other diseases of the respiratory system: Secondary | ICD-10-CM | POA: Insufficient documentation

## 2013-03-13 DIAGNOSIS — Y9301 Activity, walking, marching and hiking: Secondary | ICD-10-CM | POA: Insufficient documentation

## 2013-03-13 DIAGNOSIS — Z85828 Personal history of other malignant neoplasm of skin: Secondary | ICD-10-CM | POA: Insufficient documentation

## 2013-03-13 DIAGNOSIS — S0181XA Laceration without foreign body of other part of head, initial encounter: Secondary | ICD-10-CM

## 2013-03-13 DIAGNOSIS — F3289 Other specified depressive episodes: Secondary | ICD-10-CM | POA: Insufficient documentation

## 2013-03-13 DIAGNOSIS — M129 Arthropathy, unspecified: Secondary | ICD-10-CM | POA: Insufficient documentation

## 2013-03-13 DIAGNOSIS — W1809XA Striking against other object with subsequent fall, initial encounter: Secondary | ICD-10-CM | POA: Insufficient documentation

## 2013-03-13 DIAGNOSIS — F329 Major depressive disorder, single episode, unspecified: Secondary | ICD-10-CM | POA: Insufficient documentation

## 2013-03-13 DIAGNOSIS — R5381 Other malaise: Secondary | ICD-10-CM | POA: Insufficient documentation

## 2013-03-13 DIAGNOSIS — Z79899 Other long term (current) drug therapy: Secondary | ICD-10-CM | POA: Insufficient documentation

## 2013-03-13 DIAGNOSIS — S7002XA Contusion of left hip, initial encounter: Secondary | ICD-10-CM

## 2013-03-13 DIAGNOSIS — S20219A Contusion of unspecified front wall of thorax, initial encounter: Secondary | ICD-10-CM | POA: Insufficient documentation

## 2013-03-13 DIAGNOSIS — Z21 Asymptomatic human immunodeficiency virus [HIV] infection status: Secondary | ICD-10-CM | POA: Insufficient documentation

## 2013-03-13 DIAGNOSIS — S7000XA Contusion of unspecified hip, initial encounter: Secondary | ICD-10-CM | POA: Insufficient documentation

## 2013-03-13 DIAGNOSIS — F17200 Nicotine dependence, unspecified, uncomplicated: Secondary | ICD-10-CM | POA: Insufficient documentation

## 2013-03-13 DIAGNOSIS — F411 Generalized anxiety disorder: Secondary | ICD-10-CM | POA: Insufficient documentation

## 2013-03-13 DIAGNOSIS — S0180XA Unspecified open wound of other part of head, initial encounter: Secondary | ICD-10-CM | POA: Insufficient documentation

## 2013-03-13 DIAGNOSIS — W19XXXA Unspecified fall, initial encounter: Secondary | ICD-10-CM

## 2013-03-13 DIAGNOSIS — R630 Anorexia: Secondary | ICD-10-CM | POA: Insufficient documentation

## 2013-03-13 DIAGNOSIS — Z85850 Personal history of malignant neoplasm of thyroid: Secondary | ICD-10-CM | POA: Insufficient documentation

## 2013-03-13 DIAGNOSIS — Z8739 Personal history of other diseases of the musculoskeletal system and connective tissue: Secondary | ICD-10-CM | POA: Insufficient documentation

## 2013-03-13 DIAGNOSIS — Y92009 Unspecified place in unspecified non-institutional (private) residence as the place of occurrence of the external cause: Secondary | ICD-10-CM | POA: Insufficient documentation

## 2013-03-13 DIAGNOSIS — S20212A Contusion of left front wall of thorax, initial encounter: Secondary | ICD-10-CM

## 2013-03-13 DIAGNOSIS — J4489 Other specified chronic obstructive pulmonary disease: Secondary | ICD-10-CM | POA: Insufficient documentation

## 2013-03-13 DIAGNOSIS — Z8701 Personal history of pneumonia (recurrent): Secondary | ICD-10-CM | POA: Insufficient documentation

## 2013-03-13 DIAGNOSIS — M542 Cervicalgia: Secondary | ICD-10-CM

## 2013-03-13 DIAGNOSIS — J449 Chronic obstructive pulmonary disease, unspecified: Secondary | ICD-10-CM | POA: Insufficient documentation

## 2013-03-13 LAB — POCT I-STAT, CHEM 8
BUN: 11 mg/dL (ref 6–23)
Calcium, Ion: 1.13 mmol/L (ref 1.13–1.30)
Chloride: 107 mEq/L (ref 96–112)
Potassium: 4.4 mEq/L (ref 3.5–5.1)
Sodium: 143 mEq/L (ref 135–145)

## 2013-03-13 MED ORDER — SODIUM CHLORIDE 0.9 % IV BOLUS (SEPSIS)
1000.0000 mL | Freq: Once | INTRAVENOUS | Status: AC
  Administered 2013-03-13: 1000 mL via INTRAVENOUS

## 2013-03-13 MED ORDER — MORPHINE SULFATE 4 MG/ML IJ SOLN
4.0000 mg | Freq: Once | INTRAMUSCULAR | Status: AC
  Administered 2013-03-13: 4 mg via INTRAVENOUS
  Filled 2013-03-13: qty 1

## 2013-03-13 MED ORDER — ONDANSETRON HCL 4 MG/2ML IJ SOLN
4.0000 mg | Freq: Once | INTRAMUSCULAR | Status: AC
  Administered 2013-03-13: 4 mg via INTRAVENOUS
  Filled 2013-03-13: qty 2

## 2013-03-13 MED ORDER — LIDOCAINE-EPINEPHRINE (PF) 1 %-1:200000 IJ SOLN
20.0000 mL | Freq: Once | INTRAMUSCULAR | Status: AC
  Administered 2013-03-13: 20 mL via INTRADERMAL
  Filled 2013-03-13: qty 20

## 2013-03-13 MED ORDER — OXYCODONE-ACETAMINOPHEN 5-325 MG PO TABS: 1.0000 | ORAL_TABLET | Freq: Four times a day (QID) | ORAL | Status: DC | PRN

## 2013-03-13 MED ORDER — LIDOCAINE-EPINEPHRINE (PF) 2 %-1:200000 IJ SOLN
10.0000 mL | Freq: Once | INTRAMUSCULAR | Status: DC
  Filled 2013-03-13: qty 10

## 2013-03-13 NOTE — ED Provider Notes (Signed)
History     CSN: [id number]  Arrival date & time 03/13/13  1359   First MD Initiated Contact with Patient 03/13/13 1359      Chief Complaint  Patient presents with  . Fall  . Head Injury    (Consider location/radiation/quality/duration/timing/severity/associated sxs/prior treatment) HPI  62 year-old female with history of HIV, arthritis, and fibromyalgia presents for evaluations of a fall. History was obtained through patient and through frame he was at bedside. Patient reports she has been feeling weak and having decreased appetite for the past week. She was walking up the steps to go into the house, staggered, fell backward hitting her head against concrete. She has a  witnessed syncopal episode. Sts she felt weak prior to the fall.  Denies precipitating headache, cp, sob prior to fall.  She suffered laceration to her left side of head from the fall, and also complaining of left rib pain. Pain is worsening with breathing and with palpation. Headache is described as a sharp throbbing sensation, 10 out of 10, nonradiating. She denies fever but endorsed chills. Denies vision changes, neck pain, shortness of breath, back pain, hip pain, or new numbness. Her friend states that patient has not been taking her usual medication, however patient reports she has been taking her medication as usual.  Patient is a poor historian and uncooperative. She is up-to-date with her tetanus.   Past Medical History  Diagnosis Date  . Arthritis   . COPD (chronic obstructive pulmonary disease)   . Lipoma     right forearm  . Headache   . Anxiety   . Depression   . HIV (human immunodeficiency virus infection)     "dx'd ~ 12/2011"  . Chronic bronchitis   . Pneumonia ~ 2000's    "once"  . Shortness of breath on exertion   . 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 Fibromyalgia     "I think I got that"  . Thyroid cancer s/p total thyroidectomy April2013 02/06/2012    Per patient report.  Recently  diagnosed. Awaiting procedure to have thyroid removed.  Under treatment with Dr Jearld Fenton .   Marland Kitchen Asthma     Past Surgical History  Procedure Laterality Date  . Thyroidectomy  03/11/12    bilaterally  . Anterior cervical decomp/discectomy fusion  05/2009    C3-4; C5-6  . Dilation and curettage of uterus    . Thyroidectomy  03/11/2012    Procedure: THYROIDECTOMY;  Surgeon: Suzanna Obey, MD;  Location: [facility] OR;  Service: ENT;  Laterality: Bilateral;    Family History  Problem Relation Age of Onset  . Heart disease Maternal Uncle     History  Substance Use Topics  . Smoking status: Current Every Day Smoker -- 1.00 packs/day for 47 years    Types: Cigarettes  . Smokeless tobacco: Former Neurosurgeon    Quit date: 12/01/1974     Comment: currently smokes   . Alcohol Use: Yes     Comment: 03/11/12 "no beer or wine ~ 01/2012"    OB History   Grav Para Term Preterm Abortions TAB SAB Ect Mult Living                  Review of Systems  Constitutional:       10 Systems reviewed and all are negative for acute change except as noted in the HPI.     Allergies  Sulfa antibiotics  Home Medications   Current Outpatient Rx  Name  Route  Sig  Dispense  Refill  . albuterol (PROVENTIL HFA;VENTOLIN HFA) 108 (90 BASE) MCG/ACT inhaler   Inhalation   Inhale 2 puffs into the lungs every 4 (four) hours as needed. For shortness of breath         . atazanavir (REYATAZ) 300 MG capsule   Oral   Take 1 capsule (300 mg total) by mouth daily with breakfast.   30 capsule   11   . citalopram (CELEXA) 20 MG tablet   Oral   Take 1 tablet (20 mg total) by mouth daily.   30 tablet   11   . EXPIRED: clonazePAM (KLONOPIN) 0.5 MG tablet   Oral   Take 1 tablet (0.5 mg total) by mouth at bedtime as needed for anxiety.   30 tablet   4   . emtricitabine-tenofovir (TRUVADA) 200-300 MG per tablet   Oral   Take 1 tablet by mouth daily.   30 tablet   11   . ENSURE (ENSURE)   Oral   Take 237 mLs by mouth 3  (three) times daily between meals.   237 mL   6   . hydrOXYzine (ATARAX/VISTARIL) 25 MG tablet   Oral   Take 1 tablet (25 mg total) by mouth every 8 (eight) hours as needed for itching.   30 tablet   1   . ibuprofen (ADVIL,MOTRIN) 200 MG tablet   Oral   Take 4 tablets (800 mg total) by mouth 3 (three) times daily with meals. For pain         . levothyroxine (SYNTHROID, LEVOTHROID) 150 MCG tablet   Oral   Take 1 tablet (150 mcg total) by mouth daily.   90 tablet   3   . meloxicam (MOBIC) 15 MG tablet   Oral   Take 15 mg by mouth daily.         . Multiple Vitamin (MULITIVITAMIN WITH MINERALS) TABS   Oral   Take 1 tablet by mouth daily.         Marland Kitchen oxyCODONE (OXYCONTIN) 10 MG 12 hr tablet   Oral   Take 1 tablet (10 mg total) by mouth every 12 (twelve) hours.   60 tablet   0   . oxyCODONE-acetaminophen (PERCOCET/ROXICET) 5-325 MG per tablet   Oral   Take 1 tablet by mouth every 6 (six) hours as needed for pain.   120 tablet   0   . ritonavir (NORVIR) 100 MG capsule   Oral   Take 1 capsule (100 mg total) by mouth daily.   30 capsule   11     Dispense as written.     SpO2 99%  Physical Exam  Nursing note and vitals reviewed. Constitutional:  Thin and ill-appearing female  HENT:  Head: Normocephalic.  1 cm laceration noted to left temple, not actively bleeding. No midface tenderness, no hemotympanum, no septal hematoma, no malocclusion.  Eyes: Conjunctivae and EOM are normal. Pupils are equal, round, and reactive to light.  Neck: Neck supple.  Cardiovascular: Normal rate and regular rhythm.   Pulmonary/Chest: Effort normal and breath sounds normal. She exhibits tenderness (Tenderness to left anterior lateral aspects of ribs without crepitus, emphysema, or deformity noted.).  Abdominal: Soft.  Musculoskeletal: She exhibits no edema and no tenderness.  Neurological: She is alert.  Skin: Skin is warm. No rash noted.     Psychiatric: She has a normal mood  and affect.    ED Course  Procedures (including critical care time)  Date: 03/13/2013  Rate: 63  Rhythm: normal sinus rhythm  QRS Axis: normal  Intervals: PR prolonged  ST/T Wave abnormalities: nonspecific ST/T changes  Conduction Disutrbances:right bundle branch block and left posterior fascicular block  Narrative Interpretation:   Old EKG Reviewed: changes noted  LACERATION REPAIR Performed by: Fayrene Helper Authorized byFayrene Helper Consent: Verbal consent obtained. Risks and benefits: risks, benefits and alternatives were discussed Consent given by: patient Patient identity confirmed: provided demographic data Prepped and Draped in normal sterile fashion Wound explored  Laceration Location: left temple region  Laceration Length: 1cm  No Foreign Bodies seen or palpated  Anesthesia: local infiltration  Local anesthetic: lidocaine 2% w epinephrine  Anesthetic total: 2 ml  Irrigation method: syringe Amount of cleaning: standard  Skin closure: prolene 5.0  Number of sutures: 2  Technique: simple interrupted  Patient tolerance: Patient tolerated the procedure well with no immediate complications.   2:27 PM Patient with fall and syncope, unsure if it was a mechanical fall. Suffered a laceration to her left temple, and complaining of left hip pain. CT and x-ray ordered. IV fluid given. Pain medication given. Work up initiated for syncope.  Care discussed with atttending.    5:42 PM Patient's able to ambulate without assist. Her facial laceration has been sutured by me. Head CT scan shows no acute fracture dislocation or abnormal bleeding. Rib x-ray is unremarkable. Left hip x-ray is unremarkable.  Pt is aware to f/u with PCP in 5 days for sutures removal and reassessment.  Return precaution discussed.  Pt stable for discharge.    Labs Reviewed  URINALYSIS, ROUTINE W REFLEX MICROSCOPIC  POCT I-STAT, CHEM 8   Dg Ribs Unilateral W/chest Left  03/13/2013  *RADIOLOGY  REPORT*  Clinical Data: Larey Seat and injured left side of chest.  Current history of COPD, asthma, and HIV.  LEFT RIBS AND CHEST - 3+ VIEW  Comparison: One-view chest x-ray 12/13/2012, 05/09/2012.  Bone window images from a CT chest 05/09/2012.  Findings: Site of maximal pain and tenderness with marked with metallic BB.  No fractures identified involving the left ribs.  No intrinsic osseous abnormalities.  Cardiac silhouette mildly enlarged but stable.  Hilar and mediastinal contours otherwise unremarkable.  Prominent bronchovascular markings diffusely, unchanged, consistent with chronic bronchitis and/or asthma.  No new pulmonary parenchymal abnormalities.  No pleural effusions.  No pneumothorax.  IMPRESSION:  1.  No left rib fractures identified. 2.  Stable cardiomegaly and chronic bronchitis/asthma.  No acute cardiopulmonary disease.   Original Report Authenticated By: Hulan Saas, M.D.    Ct Head Wo Contrast  03/13/2013  *RADIOLOGY REPORT*  Clinical Data: Fall, became dizzy, fell, and hit head.  Left-sided pain.  CT HEAD WITHOUT CONTRAST  Technique:  Contiguous axial images were obtained from the base of the skull through the vertex without contrast.  Comparison: 05/09/2012.  Findings:  There is no evidence for acute infarction, intracranial hemorrhage, mass lesion, hydrocephalus, or extra-axial fluid.  Mild atrophy.  Mild chronic microvascular ischemic change.  No evidence for subdural hematoma.  The calvarium is intact.  There is no skull fracture.  There is a moderate-sized left frontotemporal scalp hematoma.  There is no mastoid fluid.  There is no sinus air-fluid level.  The orbits are negative.  IMPRESSION: Mild atrophy and chronic microvascular ischemic change.  No skull fracture or intracranial hemorrhage.  Left frontotemporal scalp hematoma.   Original Report Authenticated By: Davonna Belling, M.D.      1. Fall at home, initial encounter  2. Face lacerations, initial encounter   3. Rib contusion,  left, initial encounter   4. Contusion, hip, left, initial encounter   5. Neck pain       MDM  BP 121/72  Pulse 77  Temp(Src) 97.3 F (36.3 C) (Oral)  Resp 18  SpO2 96%  I have reviewed nursing notes and vital signs. I personally reviewed the imaging tests through PACS system  I reviewed available ER/hospitalization records thought the EMR         Fayrene Helper, [place] 03/13/13 1815

## 2013-03-13 NOTE — ED Notes (Signed)
Pt used the bathroom unable to obtain urine sample

## 2013-03-13 NOTE — ED Notes (Signed)
Pt reports to the ED via [facility] following a syncopal episode. Pt fell to concrete and has a laceration to the left anterior forehead. Bleeding controlled at this time and pressure dressing in place. Per EMS family reports she has not been taking her medicine or eating. Pt CBG 94 mg/dl. Pt A&O x4. Pt reports headache, left arm pain, and left rib pain. CMS in affected arm intact.

## 2013-03-14 NOTE — ED Provider Notes (Signed)
Medical screening examination/treatment/procedure(s) were conducted as a shared visit with non-physician practitioner(s) and myself.  I personally evaluated the patient during the encounter.  Patient is ambulatory without neurological deficits after fall. Suture repair per physician's assistant  Donnetta Hutching, MD 03/14/13 1743

## 2013-03-18 ENCOUNTER — Emergency Department (INDEPENDENT_AMBULATORY_CARE_PROVIDER_SITE_OTHER)
Admission: EM | Admit: 2013-03-18 | Discharge: 2013-03-18 | Disposition: A | Discharge status: Home / Self Care | Payer: Medicaid Other | Source: Home / Self Care | Attending: Emergency Medicine | Admitting: Emergency Medicine

## 2013-03-18 ENCOUNTER — Encounter (HOSPITAL_COMMUNITY): Payer: Self-pay | Admitting: Emergency Medicine

## 2013-03-18 DIAGNOSIS — Z5189 Encounter for other specified aftercare: Secondary | ICD-10-CM

## 2013-03-18 DIAGNOSIS — S0191XD Laceration without foreign body of unspecified part of head, subsequent encounter: Secondary | ICD-10-CM

## 2013-03-18 NOTE — ED Provider Notes (Signed)
[name] is a 62 y.o. female who presents to Urgent Care today for suture removal. Patient fell several days ago resulted in a left head laceration. This is repaired with 2 simple interrupted sutures on 4/13. She is doing well the interim. She notes bruising along her ribs and hip on the left side. She denies any fevers or chills or further episodes of syncope or falls.    PMH reviewed. HIV History  Substance Use Topics  . Smoking status: Current Every Day Smoker -- 1.00 packs/day for 47 years    Types: Cigarettes  . Smokeless tobacco: Former Neurosurgeon    Quit date: 12/01/1974     Comment: currently smokes   . Alcohol Use: Yes     Comment: 03/11/12 "no beer or wine ~ 01/2012"   ROS as above Medications reviewed. No current facility-administered medications for this encounter.   Current Outpatient Prescriptions  Medication Sig Dispense Refill  . citalopram (CELEXA) 20 MG tablet Take 1 tablet (20 mg total) by mouth daily.  30 tablet  11  . emtricitabine-tenofovir (TRUVADA) 200-300 MG per tablet Take 1 tablet by mouth daily.  30 tablet  11  . ENSURE (ENSURE) Take 237 mLs by mouth 3 (three) times daily between meals.  237 mL  6  . levothyroxine (SYNTHROID, LEVOTHROID) 150 MCG tablet Take 1 tablet (150 mcg total) by mouth daily.  90 tablet  3  . ritonavir (NORVIR) 100 MG capsule Take 1 capsule (100 mg total) by mouth daily.  30 capsule  11  . albuterol (PROVENTIL HFA;VENTOLIN HFA) 108 (90 BASE) MCG/ACT inhaler Inhale 2 puffs into the lungs every 4 (four) hours as needed. For shortness of breath      . atazanavir (REYATAZ) 300 MG capsule Take 1 capsule (300 mg total) by mouth daily with breakfast.  30 capsule  11  . hydrOXYzine (ATARAX/VISTARIL) 25 MG tablet Take 1 tablet (25 mg total) by mouth every 8 (eight) hours as needed for itching.  30 tablet  1  . ibuprofen (ADVIL,MOTRIN) 200 MG tablet Take 4 tablets (800 mg total) by mouth 3 (three) times daily with meals. For pain      . meloxicam  (MOBIC) 15 MG tablet Take 15 mg by mouth daily.      . Multiple Vitamin (MULITIVITAMIN WITH MINERALS) TABS Take 1 tablet by mouth daily.      Marland Kitchen oxyCODONE (OXYCONTIN) 10 MG 12 hr tablet Take 1 tablet (10 mg total) by mouth every 12 (twelve) hours.  60 tablet  0  . oxyCODONE-acetaminophen (PERCOCET/ROXICET) 5-325 MG per tablet Take 1 tablet by mouth every 6 (six) hours as needed for pain.  10 tablet  0    Exam:  BP 118/64  Pulse 60  Temp(Src) 97.8 F (36.6 C) (Oral)  Resp 20  SpO2 100% Gen: Well NAD SKIN: Left forehead laceration well-appearing. No exudate or erythema  2 simple interrupted sutures removed patient tolerated procedure well    Assessment and Plan: 62 y.o. female with laceration. Sutures removed today. Patient will followup with her primary care provider on the 23rd.  Discussed warning signs or symptoms. Please see discharge instructions. Patient expresses understanding.      Rodolph Bong, MD 03/18/13 [phone]

## 2013-03-18 NOTE — ED Notes (Signed)
Removal of stitches from left eye. Appears well healed no signs of infection.

## 2013-03-18 NOTE — ED Notes (Signed)
Pt is here for suture removal.   Pt would like refill on pain meds.   otc meds not working.

## 2013-03-18 NOTE — ED Provider Notes (Signed)
Medical screening examination/treatment/procedure(s) were performed by a resident physician and as supervising physician I was immediately available for consultation/collaboration.  Leslee Home, M.D.   Reuben Likes, MD 03/18/13 [phone]

## 2013-03-23 ENCOUNTER — Encounter (HOSPITAL_COMMUNITY): Payer: Self-pay | Admitting: Emergency Medicine

## 2013-03-23 ENCOUNTER — Emergency Department (HOSPITAL_COMMUNITY): Payer: Medicaid Other

## 2013-03-23 ENCOUNTER — Emergency Department (HOSPITAL_COMMUNITY)
Admission: EM | Admit: 2013-03-23 | Discharge: 2013-03-23 | Disposition: A | Discharge status: Home / Self Care | Payer: Medicaid Other | Attending: Emergency Medicine | Admitting: Emergency Medicine

## 2013-03-23 ENCOUNTER — Other Ambulatory Visit: Payer: Self-pay

## 2013-03-23 DIAGNOSIS — J4489 Other specified chronic obstructive pulmonary disease: Secondary | ICD-10-CM | POA: Insufficient documentation

## 2013-03-23 DIAGNOSIS — Z8701 Personal history of pneumonia (recurrent): Secondary | ICD-10-CM | POA: Insufficient documentation

## 2013-03-23 DIAGNOSIS — R0789 Other chest pain: Secondary | ICD-10-CM

## 2013-03-23 DIAGNOSIS — Z8739 Personal history of other diseases of the musculoskeletal system and connective tissue: Secondary | ICD-10-CM | POA: Insufficient documentation

## 2013-03-23 DIAGNOSIS — Z8679 Personal history of other diseases of the circulatory system: Secondary | ICD-10-CM | POA: Insufficient documentation

## 2013-03-23 DIAGNOSIS — F3289 Other specified depressive episodes: Secondary | ICD-10-CM | POA: Insufficient documentation

## 2013-03-23 DIAGNOSIS — Z8709 Personal history of other diseases of the respiratory system: Secondary | ICD-10-CM | POA: Insufficient documentation

## 2013-03-23 DIAGNOSIS — J45909 Unspecified asthma, uncomplicated: Secondary | ICD-10-CM | POA: Insufficient documentation

## 2013-03-23 DIAGNOSIS — Z85850 Personal history of malignant neoplasm of thyroid: Secondary | ICD-10-CM | POA: Insufficient documentation

## 2013-03-23 DIAGNOSIS — G8921 Chronic pain due to trauma: Secondary | ICD-10-CM | POA: Insufficient documentation

## 2013-03-23 DIAGNOSIS — W1789XA Other fall from one level to another, initial encounter: Secondary | ICD-10-CM | POA: Insufficient documentation

## 2013-03-23 DIAGNOSIS — F329 Major depressive disorder, single episode, unspecified: Secondary | ICD-10-CM | POA: Insufficient documentation

## 2013-03-23 DIAGNOSIS — Y939 Activity, unspecified: Secondary | ICD-10-CM | POA: Insufficient documentation

## 2013-03-23 DIAGNOSIS — Y92009 Unspecified place in unspecified non-institutional (private) residence as the place of occurrence of the external cause: Secondary | ICD-10-CM | POA: Insufficient documentation

## 2013-03-23 DIAGNOSIS — Z21 Asymptomatic human immunodeficiency virus [HIV] infection status: Secondary | ICD-10-CM | POA: Insufficient documentation

## 2013-03-23 DIAGNOSIS — F411 Generalized anxiety disorder: Secondary | ICD-10-CM | POA: Insufficient documentation

## 2013-03-23 DIAGNOSIS — M940 Chondrocostal junction syndrome [Tietze]: Secondary | ICD-10-CM | POA: Insufficient documentation

## 2013-03-23 DIAGNOSIS — J449 Chronic obstructive pulmonary disease, unspecified: Secondary | ICD-10-CM | POA: Insufficient documentation

## 2013-03-23 DIAGNOSIS — F17200 Nicotine dependence, unspecified, uncomplicated: Secondary | ICD-10-CM | POA: Insufficient documentation

## 2013-03-23 DIAGNOSIS — Z79899 Other long term (current) drug therapy: Secondary | ICD-10-CM | POA: Insufficient documentation

## 2013-03-23 MED ORDER — MELOXICAM 15 MG PO TABS: 15.0000 mg | ORAL_TABLET | Freq: Every day | ORAL | Status: DC

## 2013-03-23 NOTE — ED Notes (Signed)
Pt alert and mentating appropriately upon d/c. Pt given d/c teaching and prescription. Pt verbalizes understanding and has no further questions upon d/c. Pt ambulatory upon d/c. NAD noted upon d/c.

## 2013-03-23 NOTE — ED Notes (Signed)
Pt sts fell off porch on Sunday and having left side and rib pain worse with cough

## 2013-03-23 NOTE — ED Provider Notes (Signed)
History     CSN: [id number]  Arrival date & time 03/23/13  1058   First MD Initiated Contact with Patient 03/23/13 1206      Chief Complaint  Patient presents with  . Fall    (Consider location/radiation/quality/duration/timing/severity/associated sxs/prior treatment) HPI Comments: Patient is a 62 year old female with a history of fibromyalgia and HIV who presents for a pain in her left side x10 days. Patient states the pain is unchanged since onset, after falling off of her porch at home. Patient describes the pain as aching and "stinging" in sensation and states that it is nonradiating. Pain is made worse with coughing and when taking a deep breath. Patient states she has not taken anything for the pain, further denying alleviating factors. Patient denies fever, vision changes, productive cough, chest pain, shortness of breath, nausea or vomiting, abdominal pain, inability to ambulate, and numbness or tingling in her extremities. Patient was seen on 03/13/2013 immediately following her fall and worked up with a CT head, EKG, and DG of unilateral left ribs which was negative for fracture.  Patient is a 62 y.o. female presenting with fall. The history is provided by the patient. No language interpreter was used.  Fall Pertinent negatives include no fever, no numbness, no nausea and no vomiting.    Past Medical History  Diagnosis Date  . Arthritis   . COPD (chronic obstructive pulmonary disease)   . Lipoma     right forearm  . Headache   . Anxiety   . Depression   . HIV (human immunodeficiency virus infection)     "dx'd ~ 12/2011"  . Chronic bronchitis   . Pneumonia ~ 2000's    "once"  . Shortness of breath on exertion   . 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 Fibromyalgia     "I think I got that"  . Thyroid cancer s/p total thyroidectomy April2013 02/06/2012    Per patient report.  Recently diagnosed. Awaiting procedure to have thyroid removed.  Under treatment with Dr Jearld Fenton  .   Marland Kitchen Asthma     Past Surgical History  Procedure Laterality Date  . Thyroidectomy  03/11/12    bilaterally  . Anterior cervical decomp/discectomy fusion  05/2009    C3-4; C5-6  . Dilation and curettage of uterus    . Thyroidectomy  03/11/2012    Procedure: THYROIDECTOMY;  Surgeon: Suzanna Obey, MD;  Location: [facility] OR;  Service: ENT;  Laterality: Bilateral;    Family History  Problem Relation Age of Onset  . Heart disease Maternal Uncle     History  Substance Use Topics  . Smoking status: Current Every Day Smoker -- 1.00 packs/day for 47 years    Types: Cigarettes  . Smokeless tobacco: Former Neurosurgeon    Quit date: 12/01/1974     Comment: currently smokes   . Alcohol Use: Yes     Comment: 03/11/12 "no beer or wine ~ 01/2012"    OB History   Grav Para Term Preterm Abortions TAB SAB Ect Mult Living                  Review of Systems  Constitutional: Negative for fever.  Eyes: Negative for visual disturbance.  Respiratory: Negative for shortness of breath.        +L sided chest wall pain  Cardiovascular: Negative for chest pain.  Gastrointestinal: Negative for nausea and vomiting.  Neurological: Negative for syncope, weakness and numbness.  All other systems reviewed and are negative.  Allergies  Sulfa antibiotics  Home Medications   Current Outpatient Rx  Name  Route  Sig  Dispense  Refill  . albuterol (PROVENTIL HFA;VENTOLIN HFA) 108 (90 BASE) MCG/ACT inhaler   Inhalation   Inhale 2 puffs into the lungs every 4 (four) hours as needed. For shortness of breath         . atazanavir (REYATAZ) 300 MG capsule   Oral   Take 1 capsule (300 mg total) by mouth daily with breakfast.   30 capsule   11   . citalopram (CELEXA) 20 MG tablet   Oral   Take 1 tablet (20 mg total) by mouth daily.   30 tablet   11   . emtricitabine-tenofovir (TRUVADA) 200-300 MG per tablet   Oral   Take 1 tablet by mouth daily.   30 tablet   11   . ENSURE (ENSURE)   Oral   Take 237  mLs by mouth 3 (three) times daily between meals.   237 mL   6   . hydrOXYzine (ATARAX/VISTARIL) 25 MG tablet   Oral   Take 1 tablet (25 mg total) by mouth every 8 (eight) hours as needed for itching.   30 tablet   1   . ibuprofen (ADVIL,MOTRIN) 200 MG tablet   Oral   Take 4 tablets (800 mg total) by mouth 3 (three) times daily with meals. For pain         . levothyroxine (SYNTHROID, LEVOTHROID) 150 MCG tablet   Oral   Take 1 tablet (150 mcg total) by mouth daily.   90 tablet   3   . meloxicam (MOBIC) 15 MG tablet   Oral   Take 1 tablet (15 mg total) by mouth daily.   30 tablet   0   . Multiple Vitamin (MULITIVITAMIN WITH MINERALS) TABS   Oral   Take 1 tablet by mouth daily.         Marland Kitchen oxyCODONE (OXYCONTIN) 10 MG 12 hr tablet   Oral   Take 1 tablet (10 mg total) by mouth every 12 (twelve) hours.   60 tablet   0   . oxyCODONE-acetaminophen (PERCOCET/ROXICET) 5-325 MG per tablet   Oral   Take 1 tablet by mouth every 6 (six) hours as needed for pain.   10 tablet   0   . ritonavir (NORVIR) 100 MG capsule   Oral   Take 1 capsule (100 mg total) by mouth daily.   30 capsule   11     Dispense as written.     BP 118/80  Pulse 68  Temp(Src) 97.9 F (36.6 C) (Oral)  Resp 18  SpO2 97%  Physical Exam  Nursing note and vitals reviewed. Constitutional: She is oriented to person, place, and time. She appears well-developed and well-nourished. No distress.  Chronically ill, but nontoxic, appearing. In NAD.  HENT:  Head: Normocephalic and atraumatic.  Mouth/Throat: Oropharynx is clear and moist. No oropharyngeal exudate.  Eyes: Conjunctivae and EOM are normal. Pupils are equal, round, and reactive to light. No scleral icterus.  Neck: Normal range of motion. Neck supple.  Cardiovascular: Normal rate, regular rhythm, normal heart sounds and intact distal pulses.   Pulmonary/Chest: Effort normal and breath sounds normal. No respiratory distress. She has no wheezes.  She has no rales. She exhibits tenderness. She exhibits no mass, no crepitus, no deformity, no swelling and no retraction.    TTP of L intercostals. Good inspiratory effort with symmetrical expansion of the chest  wall. No respiratory distress or accessory muscle use.  Abdominal: Soft. She exhibits no distension. There is no tenderness.  Musculoskeletal: Normal range of motion.  Lymphadenopathy:    She has no cervical adenopathy.  Neurological: She is alert and oriented to person, place, and time.  Skin: Skin is warm and dry. No rash noted. She is not diaphoretic. No erythema.  Psychiatric: She has a normal mood and affect. Her behavior is normal.    ED Course  Procedures (including critical care time)  Labs Reviewed - No data to display Dg Ribs Unilateral W/chest Left  03/23/2013  *RADIOLOGY REPORT*  Clinical Data: Fall with a blow to the left side of the chest. Pain.  LEFT RIBS AND CHEST - 3+ VIEW  Comparison: Plain films of the chest and left ribs 03/13/2013. CT chest 05/09/2012.  Findings: The lungs are emphysematous but clear.  No pneumothorax or pleural effusion is identified.  Lung volumes are slightly lower than on the prior study.  Heart size is normal.  No fracture is identified.  IMPRESSION: Negative for fracture or acute abnormality.  Emphysema.   Original Report Authenticated By: Holley Dexter, M.D.      1. Costochondritis   2. Tenderness of chest wall      MDM  Uncomplicated chest wall tenderness and costochondritis 2/2 fall at home 10 days ago. Patient seen for evaluation in ED on the day of her fall 10 days ago. She was worked up with CT head, EKG, and DG of unilateral left ribs which was negative for fracture. Physical exam significant for TTP of L intercostal muscles. Patient neurovascularly intact with good inspiratory effort and symmetric expansion of her chest wall; denies SOB, chest pain, and any new trauma or injury to L side in the last 10 days. Xray today  without evidence of fracture, PTX, or pleural effusion. Patient currently between pain management clinics where she receives narcotic pain medicine. Stable for d/c with PCP follow up and Mobic to take for discomfort. Ice and rest also recommended and indications for ED return discussed. Patient states comfort and understanding with this d/c plan with no unaddressed concerns.   Filed Vitals:   03/23/13 1109 03/23/13 1327  BP: 118/80 121/76  Pulse: 68 63  Temp: 97.9 F (36.6 C) 97.4 F (36.3 C)  TempSrc: Oral Oral  Resp: 18 16  SpO2: 97% 99%        Antony Madura, PA-C 03/24/13 [phone]

## 2013-03-24 NOTE — ED Provider Notes (Signed)
Medical screening examination/treatment/procedure(s) were performed by non-physician practitioner and as supervising physician I was immediately available for consultation/collaboration. Devoria Albe, MD, FACEP   Ward Givens, MD 03/24/13 1500

## 2013-04-18 ENCOUNTER — Other Ambulatory Visit: Payer: Self-pay | Admitting: *Deleted

## 2013-04-18 DIAGNOSIS — L299 Pruritus, unspecified: Secondary | ICD-10-CM

## 2013-04-18 MED ORDER — HYDROXYZINE HCL 25 MG PO TABS: 25.0000 mg | ORAL_TABLET | Freq: Three times a day (TID) | ORAL | Status: DC | PRN

## 2013-04-19 ENCOUNTER — Other Ambulatory Visit: Payer: Self-pay

## 2013-04-19 DIAGNOSIS — Z1231 Encounter for screening mammogram for malignant neoplasm of breast: Secondary | ICD-10-CM

## 2013-04-20 ENCOUNTER — Encounter: Payer: Self-pay | Admitting: Endocrinology

## 2013-04-20 ENCOUNTER — Ambulatory Visit (INDEPENDENT_AMBULATORY_CARE_PROVIDER_SITE_OTHER): Payer: Medicaid Other | Admitting: Endocrinology

## 2013-04-20 VITALS — BP 122/74 | HR 76 | Ht 70.0 in | Wt 127.0 lb

## 2013-04-20 DIAGNOSIS — E890 Postprocedural hypothyroidism: Secondary | ICD-10-CM

## 2013-04-20 LAB — TSH: TSH: 4.36 u[IU]/mL (ref 0.35–5.50)

## 2013-04-20 NOTE — Patient Instructions (Addendum)
Please stop the levothyroxine for now blood tests are being requested for you today.  We'll contact you with results. Please come back for a follow-up appointment in 2 weeks.

## 2013-04-20 NOTE — Progress Notes (Signed)
Subjective:    Patient ID: [name], female    DOB: [birth year], 62 y.o.   MRN: [id number]  HPI In early 2013, pt had suspicious thyroid bx result.  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  She was scheduled for adjuvant i-131 rx, but she was not compliant with f/u TSH values off the synthroid.  As the weather is warmer now, she can go-off her synthroid.  She has moderate cold intolerance of the hands and feet, but no assoc numbness.   Past Medical History  Diagnosis Date  . Arthritis   . COPD (chronic obstructive pulmonary disease)   . Lipoma     right forearm  . Headache   . Anxiety   . Depression   . HIV (human immunodeficiency virus infection)     "dx'd ~ 12/2011"  . Chronic bronchitis   . Pneumonia ~ 2000's    "once"  . Shortness of breath on exertion   . 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 Fibromyalgia     "I think I got that"  . Thyroid cancer s/p total thyroidectomy April2013 02/06/2012    Per patient report.  Recently diagnosed. Awaiting procedure to have thyroid removed.  Under treatment with Dr Jearld Fenton .   Marland Kitchen Asthma     Past Surgical History  Procedure Laterality Date  . Thyroidectomy  03/11/12    bilaterally  . Anterior cervical decomp/discectomy fusion  05/2009    C3-4; C5-6  . Dilation and curettage of uterus    . Thyroidectomy  03/11/2012    Procedure: THYROIDECTOMY;  Surgeon: Suzanna Obey, MD;  Location: [facility] OR;  Service: ENT;  Laterality: Bilateral;    History   Social History  . Marital Status: Divorced    Spouse Name: N/A    Number of Children: N/A  . Years of Education: 12   Occupational History  . Not on file.   Social History Main Topics  . Smoking status: Current Every Day Smoker -- 1.00 packs/day for 47 years    Types: Cigarettes  . Smokeless tobacco: Former Neurosurgeon    Quit date: 12/01/1974     Comment: currently smokes   . Alcohol Use: Yes     Comment: 03/11/12 "no  beer or wine ~ 01/2012"  . Drug Use: 1.00 per week    Special: Marijuana, Cocaine     Comment: 03/11/12 "last drug use early /2013"  . Sexually Active: Not Currently -- Female partner(s)    Birth Control/ Protection: None, Post-menopausal   Other Topics Concern  . Not on file   Social History Narrative   Regular exercise-yes (walking)   Caffeine Use-yes          Current Outpatient Prescriptions on File Prior to Visit  Medication Sig Dispense Refill  . albuterol (PROVENTIL HFA;VENTOLIN HFA) 108 (90 BASE) MCG/ACT inhaler Inhale 2 puffs into the lungs every 4 (four) hours as needed. For shortness of breath      . atazanavir (REYATAZ) 300 MG capsule Take 1 capsule (300 mg total) by mouth daily with breakfast.  30 capsule  11  . citalopram (CELEXA) 20 MG tablet Take 1 tablet (20 mg total) by mouth daily.  30 tablet  11  . emtricitabine-tenofovir (TRUVADA) 200-300 MG per tablet Take 1 tablet by mouth daily.  30 tablet  11  . ENSURE (ENSURE) Take 237 mLs by mouth 3 (three) times daily between meals.  237 mL  6  . hydrOXYzine (ATARAX/VISTARIL) 25 MG tablet Take 1 tablet (25 mg total) by mouth every 8 (eight) hours as needed for itching.  30 tablet  1  . ibuprofen (ADVIL,MOTRIN) 200 MG tablet Take 4 tablets (800 mg total) by mouth 3 (three) times daily with meals. For pain      . meloxicam (MOBIC) 15 MG tablet Take 1 tablet (15 mg total) by mouth daily.  30 tablet  0  . Multiple Vitamin (MULITIVITAMIN WITH MINERALS) TABS Take 1 tablet by mouth daily.      Marland Kitchen oxyCODONE (OXYCONTIN) 10 MG 12 hr tablet Take 1 tablet (10 mg total) by mouth every 12 (twelve) hours.  60 tablet  0  . oxyCODONE-acetaminophen (PERCOCET/ROXICET) 5-325 MG per tablet Take 1 tablet by mouth every 6 (six) hours as needed for pain.  10 tablet  0  . ritonavir (NORVIR) 100 MG capsule Take 1 capsule (100 mg total) by mouth daily.  30 capsule  11   No current facility-administered medications on file prior to visit.    Allergies   Allergen Reactions  . Sulfa Antibiotics Hives and Other (See Comments)    "in my shoulders; legs; feet; made me burn all over"    Family History  Problem Relation Age of Onset  . Heart disease Maternal Uncle    BP 122/74  Pulse 76  Ht 5\' 10"  (1.778 m)  Wt 127 lb (57.607 kg)  BMI 18.22 kg/m2  SpO2 98%  Review of Systems She has chronic arthralgias.  She denies any nodule at the neck.      Objective:   Physical Exam VITAL SIGNS:  See vs page GENERAL: no distress Neck: a healed scar is present.  i do not appreciate a nodule in the thyroid or elsewhere in the neck.    Lab Results  Component Value Date   TSH 4.36 04/20/2013   T3TOTAL 52.8* 07/07/2012   T4TOTAL 1.6* 12/13/2012      Assessment & Plan:  Thyroid cancer.  We can pursue adjuvant i-131 when she is hypothyroid Post-i-131 hypothyroidism; when she resumes synthroid, she'll need a slightly higher dosage. Cold intolerance, not thyroid-related.

## 2013-04-27 ENCOUNTER — Encounter: Payer: Self-pay | Admitting: Family Medicine

## 2013-04-27 ENCOUNTER — Ambulatory Visit: Payer: Medicaid Other | Attending: Family Medicine | Admitting: Family Medicine

## 2013-04-27 VITALS — BP 120/90 | HR 70 | Temp 97.5°F | Resp 16 | Wt 130.4 lb

## 2013-04-27 DIAGNOSIS — G894 Chronic pain syndrome: Secondary | ICD-10-CM | POA: Insufficient documentation

## 2013-04-27 NOTE — Progress Notes (Signed)
Subjective:     Patient ID: [name], female   DOB: [birth year], 62 y.o.   MRN: [id number]  HPI Pt here to establish care.   She is most concerned today about getting refills on her pain medications. She used to be seen at [facility] and also has been seen at a pain clinic [facility]. She has not been to the pain clinic for a year,  Per the pt because the physician made her mad and so she "cussed him out". She has chronic pain in her knees and back.   She follows with ID for HIV and with endo for having had thyroid cancer.      Review of Systems negative except per hpi     Objective:   Physical Exam  Nursing note and vitals reviewed. Constitutional: She appears well-developed.  thin  Cardiovascular: Normal rate and regular rhythm.   Pulmonary/Chest: Effort normal.       Assessment:     Chronic pain syndrome - Plan: Ambulatory referral to Pain Clinic       Plan:     Discussed at great length that we are unable to provide chronic pain management here. I have placed a referral for her to the pain management clinic and we will await their response.   We are more than happy to provide primary care for this patient.  She should cont to f/u with her specialists for the HIV and thyroid.   F/u up 1 month for a 30 minute visit so we can address her other chronic health issues.

## 2013-04-27 NOTE — Patient Instructions (Signed)
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
## 2013-05-04 ENCOUNTER — Ambulatory Visit: Payer: Medicaid Other | Admitting: Endocrinology

## 2013-05-06 ENCOUNTER — Telehealth: Payer: Self-pay | Admitting: Family Medicine

## 2013-05-06 ENCOUNTER — Other Ambulatory Visit: Payer: Self-pay | Admitting: *Deleted

## 2013-05-06 DIAGNOSIS — F411 Generalized anxiety disorder: Secondary | ICD-10-CM

## 2013-05-06 MED ORDER — CITALOPRAM HYDROBROMIDE 20 MG PO TABS: 20.0000 mg | ORAL_TABLET | Freq: Every day | ORAL | Status: DC

## 2013-05-06 NOTE — Telephone Encounter (Signed)
Pain clinic referral 

## 2013-05-11 ENCOUNTER — Ambulatory Visit (INDEPENDENT_AMBULATORY_CARE_PROVIDER_SITE_OTHER): Payer: Medicaid Other | Admitting: Endocrinology

## 2013-05-11 ENCOUNTER — Encounter: Payer: Self-pay | Admitting: Endocrinology

## 2013-05-11 VITALS — BP 124/78 | HR 78 | Ht 68.0 in | Wt 132.0 lb

## 2013-05-11 DIAGNOSIS — E890 Postprocedural hypothyroidism: Secondary | ICD-10-CM

## 2013-05-11 LAB — T4, FREE: Free T4: 0.26 ng/dL — ABNORMAL LOW (ref 0.60–1.60)

## 2013-05-11 LAB — TSH: TSH: 14.84 u[IU]/mL — ABNORMAL HIGH (ref 0.35–5.50)

## 2013-05-11 NOTE — Patient Instructions (Addendum)
Please stay-off the levothyroxine.  Thyroid blood tests are being requested for you today.  We'll contact you with results.

## 2013-05-11 NOTE — Progress Notes (Signed)
Subjective:    Patient ID: [name], female    DOB: 1951/11/24, 62 y.o.   MRN: [id number]  HPI In early 2013, pt had suspicious thyroid bx result.  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  She was scheduled for adjuvant i-131 rx, but she was not compliant with f/u TSH values off the synthroid.  As the weather is warmer now, synthroid was stopped.  She has moderate cold intolerance of the hands and feet, and assoc dry skin.   Past Medical History  Diagnosis Date  . Arthritis   . COPD (chronic obstructive pulmonary disease)   . Lipoma     right forearm  . Headache(784.0)   . Anxiety   . Depression   . HIV (human immunodeficiency virus infection)     "dx'd ~ 12/2011"  . Chronic bronchitis   . Pneumonia ~ 2000's    "once"  . Shortness of breath on exertion   . 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 Fibromyalgia     "I think I got that"  . Thyroid cancer s/p total thyroidectomy April2013 02/06/2012    Per patient report.  Recently diagnosed. Awaiting procedure to have thyroid removed.  Under treatment with Dr Jearld Fenton .   Marland Kitchen Asthma     Past Surgical History  Procedure Laterality Date  . Thyroidectomy  03/11/12    bilaterally  . Anterior cervical decomp/discectomy fusion  05/2009    C3-4; C5-6  . Dilation and curettage of uterus    . Thyroidectomy  03/11/2012    Procedure: THYROIDECTOMY;  Surgeon: Suzanna Obey, MD;  Location: [facility] OR;  Service: ENT;  Laterality: Bilateral;    History   Social History  . Marital Status: Divorced    Spouse Name: N/A    Number of Children: N/A  . Years of Education: 12   Occupational History  . Not on file.   Social History Main Topics  . Smoking status: Current Every Day Smoker -- 1.00 packs/day for 47 years    Types: Cigarettes  . Smokeless tobacco: Former Neurosurgeon    Quit date: 12/01/1974     Comment: currently smokes   . Alcohol Use: Yes     Comment: 03/11/12 "no beer  or wine ~ 01/2012"  . Drug Use: 1.00 per week    Special: Marijuana, Cocaine     Comment: 03/11/12 "last drug use early /2013"  . Sexually Active: Not Currently -- Female partner(s)    Birth Control/ Protection: None, Post-menopausal   Other Topics Concern  . Not on file   Social History Narrative   Regular exercise-yes (walking)   Caffeine Use-yes          Current Outpatient Prescriptions on File Prior to Visit  Medication Sig Dispense Refill  . albuterol (PROVENTIL HFA;VENTOLIN HFA) 108 (90 BASE) MCG/ACT inhaler Inhale 2 puffs into the lungs every 4 (four) hours as needed. For shortness of breath      . atazanavir (REYATAZ) 300 MG capsule Take 1 capsule (300 mg total) by mouth daily with breakfast.  30 capsule  11  . citalopram (CELEXA) 20 MG tablet Take 1 tablet (20 mg total) by mouth daily.  30 tablet  11  . emtricitabine-tenofovir (TRUVADA) 200-300 MG per tablet Take 1 tablet by mouth daily.  30 tablet  11  . ENSURE (ENSURE) Take 237 mLs by mouth 3 (three) times daily between meals.  237 mL  6  .  hydrOXYzine (ATARAX/VISTARIL) 25 MG tablet Take 1 tablet (25 mg total) by mouth every 8 (eight) hours as needed for itching.  30 tablet  1  . ibuprofen (ADVIL,MOTRIN) 200 MG tablet Take 4 tablets (800 mg total) by mouth 3 (three) times daily with meals. For pain      . meloxicam (MOBIC) 15 MG tablet Take 1 tablet (15 mg total) by mouth daily.  30 tablet  0  . Multiple Vitamin (MULITIVITAMIN WITH MINERALS) TABS Take 1 tablet by mouth daily.      Marland Kitchen oxyCODONE (OXYCONTIN) 10 MG 12 hr tablet Take 1 tablet (10 mg total) by mouth every 12 (twelve) hours.  60 tablet  0  . oxyCODONE-acetaminophen (PERCOCET/ROXICET) 5-325 MG per tablet Take 1 tablet by mouth every 6 (six) hours as needed for pain.  10 tablet  0  . ritonavir (NORVIR) 100 MG capsule Take 1 capsule (100 mg total) by mouth daily.  30 capsule  11   No current facility-administered medications on file prior to visit.    Allergies   Allergen Reactions  . Sulfa Antibiotics Hives and Other (See Comments)    "in my shoulders; legs; feet; made me burn all over"    Family History  Problem Relation Age of Onset  . Heart disease Maternal Uncle     BP 124/78  Pulse 78  Ht 5\' 8"  (1.727 m)  Wt 132 lb (59.875 kg)  BMI 20.08 kg/m2  SpO2 96%  Review of Systems Denies weight change, but she has arthralgias.    Objective:   Physical Exam VITAL SIGNS:  See vs page GENERAL: no distress Neck: a healed scar is present.  i do not appreciate a nodule in the thyroid or elsewhere in the neck     Assessment & Plan:  Thyroid cancer.  She may be ready for adjuvant i-131 rx.

## 2013-05-11 NOTE — Telephone Encounter (Signed)
I spoke with [name] yesterday . She is going to her previous Dr office to request her medical records and we can sent it to the pain clinic .

## 2013-05-12 ENCOUNTER — Other Ambulatory Visit: Payer: Self-pay | Admitting: Endocrinology

## 2013-05-12 DIAGNOSIS — E890 Postprocedural hypothyroidism: Secondary | ICD-10-CM

## 2013-05-16 ENCOUNTER — Telehealth: Payer: Self-pay | Admitting: Family Medicine

## 2013-05-16 ENCOUNTER — Ambulatory Visit
Admission: RE | Admit: 2013-05-16 | Discharge: 2013-05-16 | Disposition: A | Discharge status: Home / Self Care | Payer: Medicaid Other | Source: Ambulatory Visit

## 2013-05-16 DIAGNOSIS — Z1231 Encounter for screening mammogram for malignant neoplasm of breast: Secondary | ICD-10-CM

## 2013-05-16 NOTE — Telephone Encounter (Signed)
Pt says she wants Seroquel called into [facility] in [place].  Pt advised that because she did not receive that script from a Dr here that she may need to set up an appt.  She said that because she was just in on 4/28 that she wants the Dr to call it in and would like clinician to call her back.  She says she does not have a tyroid and wants something to calm her nerves.  Pt says she tried a cousin's Seroquel and found that it calmed her down so would like a script.

## 2013-05-17 ENCOUNTER — Other Ambulatory Visit: Payer: Self-pay | Admitting: Licensed Clinical Social Worker

## 2013-05-17 ENCOUNTER — Other Ambulatory Visit: Payer: Self-pay | Admitting: Family Medicine

## 2013-05-17 DIAGNOSIS — L299 Pruritus, unspecified: Secondary | ICD-10-CM

## 2013-05-17 DIAGNOSIS — R928 Other abnormal and inconclusive findings on diagnostic imaging of breast: Secondary | ICD-10-CM

## 2013-05-17 MED ORDER — HYDROXYZINE HCL 25 MG PO TABS: 25.0000 mg | ORAL_TABLET | Freq: Three times a day (TID) | ORAL | Status: DC | PRN

## 2013-05-19 ENCOUNTER — Ambulatory Visit: Payer: Medicaid Other

## 2013-05-19 ENCOUNTER — Telehealth: Payer: Self-pay | Admitting: Family Medicine

## 2013-05-19 NOTE — Telephone Encounter (Signed)
Pt returning call

## 2013-05-24 ENCOUNTER — Other Ambulatory Visit: Payer: Self-pay | Admitting: Licensed Clinical Social Worker

## 2013-05-24 DIAGNOSIS — B20 Human immunodeficiency virus [HIV] disease: Secondary | ICD-10-CM

## 2013-05-24 MED ORDER — EMTRICITABINE-TENOFOVIR DF 200-300 MG PO TABS: 1.0000 | ORAL_TABLET | Freq: Every day | ORAL | Status: DC

## 2013-05-24 MED ORDER — ATAZANAVIR SULFATE 300 MG PO CAPS: 300.0000 mg | ORAL_CAPSULE | Freq: Every day | ORAL | Status: DC

## 2013-05-24 MED ORDER — RITONAVIR 100 MG PO CAPS: 100.0000 mg | ORAL_CAPSULE | Freq: Every day | ORAL | Status: DC

## 2013-05-24 NOTE — Telephone Encounter (Signed)
Leave a Message to patient to return my call

## 2013-05-26 ENCOUNTER — Ambulatory Visit: Payer: Medicaid Other | Attending: Family Medicine | Admitting: Family Medicine

## 2013-05-26 VITALS — BP 146/82 | HR 57 | Temp 98.0°F | Resp 15 | Wt 137.0 lb

## 2013-05-26 DIAGNOSIS — R52 Pain, unspecified: Secondary | ICD-10-CM | POA: Insufficient documentation

## 2013-05-26 DIAGNOSIS — M21612 Bunion of left foot: Secondary | ICD-10-CM

## 2013-05-26 DIAGNOSIS — M21619 Bunion of unspecified foot: Secondary | ICD-10-CM | POA: Insufficient documentation

## 2013-05-26 NOTE — Patient Instructions (Addendum)
Pain Management, Chronic You have a painful condition that has required frequent use of narcotic-type pain medicine. We would like to see that you receive the best possible care for your problem. To achieve this, you must have a personal physician who can supervise a treatment plan for you. You may locate a personal physician on your own or contact one of the doctors whose name has been given to you. If your physician determines that you need to visit the Emergency Department for pain control, that doctor should provide you with a pain contract. This is a letter from your doctor which describes what pain medicine you may receive, how much and how often. You sign it agreeing to the terms of the treatment plan. Bring this with each time you come to this facility. It will help the Emergency Physician provide the proper treatment for you with minimal delay. Please Note: In the future you may not be able to receive narcotic pain medicine from this facility without a pain contract or telephone approval from your personal physician. Document Released: 07/10/2004 Document Revised: 02/09/2012 Document Reviewed: 11/13/2008 [facility] Patient Information 2014 [place], [place].  Bunion Care A bunion is a boney protrusion at the base of your big toe (metatarsal-phalangeal joint). This problem, if painful or troublesome can be corrected with surgery. This is an elective surgery, so you can pick a convenient time for the procedure. The surgery may:  Improve appearance (cosmetic).  Relieve pain.  Improve function. Your foot is made up of a complex set of twenty-six bones which are held together by tough fibrous ligaments. The movement of the foot is controlled by muscles in the foot and leg. These muscles attach to the foot by cord like structures (tendons) that attach muscle to bone. If surgery is recommended, your caregiver will explain your foot problem and how surgery can improve it. Your caregiver can answer  questions you may have about the potential risks and complications involved. After determining that foot surgery is necessary to correct your problem, you can proceed with plans for the surgery. LET YOUR CAREGIVER KNOW ABOUT:   Previous problems with anesthetics or medicines used to numb the skin.  Allergies to dyes, iodine, foods, and/or latex.  Medicines taken including herbs, eye drops, prescription medicines (especially medicines used to "thin the blood"), aspirin and other over-the-counter medicines, and steroids (by mouth or as a cream).  History of bleeding or blood problems.  Possibility of pregnancy, if this applies.  History of blood clots in your legs and/or lungs.  Previous surgery.  Other important health problems. Let your caregiver know about health changes prior to surgery. BEFORE THE PROCEDURE  You should be present 60 minutes prior to your procedure or as directed.  PROCEDURE BUNION TYPES AND THEIR TREATMENTS  Positional Bunion. A positional bunion develops when a bony growth on the side of the metatarsal bone enlarges the joint. The metatarsal forces the joint capsule to stretch over it. As this growth pushes the big toe toward the others, the tendons on the inside tighten. This forces the big toe farther out of alignment. The bunion presses against the shoe, irritating the skin and causes further pain and disability.  Positional Bunionectomy Treatment. The bunion is removed. Tight tendons may be released.  Follow-up Care. Your toe is apt to be stiff at first but will loosen up as you move it. You may need to wear a special surgical shoe and, possibly, a splint for about three weeks.  Mild Structural Bunion.  Structural bunions occur when the angle between the first and second metatarsal bones increases to a point where it is greater than normal. The increased angle of the metatarsals makes the big toe slant toward the other toes. Sometimes bony growths may form.  Irritation and swelling often follow.  Structural Bunionectomy Treatment. Your caregiver surgically repositions the bone by decreasing the angle and may use a fixation device to hold it together. The bunion (bump) is also removed.  Degenerative Joint Disease (Arthritis). When wear-and-tear arthritis (osteoarthritis) of aging affects the big toe joint, pain and reduced joint motion may result. This is not a true bunion but may be associated with bunions. Left untreated, it can increase wear and tear in the joint and break down the cartilage. Pain and stiffness are problems of both wear-and-tear arthritis and rheumatoid arthritis.  Arthroplasty With Joint Implantation As Treatment. The bunion is first removed; then the degenerated joint is removed and replaced with an implant. AFTER THE PROCEDURE  After surgery your bone will heal in phases. A callous (new bone formation) forms, bridging the damaged bone allowing it to heal. In about 6 months, the bone is back to normal strength along with a return of nearly normal function. It is best to do elective surgeries when your health is optimal.  After surgery, you will be taken to the recovery area where a nurse will watch and check your progress. Once you are awake, stable, and taking fluids well, barring other problems you will be allowed to go home. HOME CARE INSTRUCTIONS  Be sure to ask your caregiver how long you will be off your feet and home from work. Plan accordingly. There are several types of bunions and varying surgical treatments for each. Common types are explained above. Your surgery may be similar and may include a fixation device (such as a small screw). Your foot and ankle may be immobilized by a cast (from your toes to below your knee). You may be asked not to bear weight on this foot for a few weeks or until comfortable. Once home, an ice pack applied to your operative site may help with discomfort and keep the swelling down. You may be able  to walk a day or two after surgery. Your podiatrist may prescribe a splint or a special shoe to be worn for several weeks. Only take over-the-counter or prescription medicines for pain, discomfort, or fever as directed by your caregiver.  SEEK MEDICAL CARE IF:   There is increased bleeding (more than a small spot) from the surgical site.  You notice redness, swelling, or increasing pain in the surgical site.  Pus is coming from the site.  An unexplained oral temperature above 102 F (38.9 C) develops.  You notice a foul smell coming from the surgical site or dressing. SEEK IMMEDIATE MEDICAL CARE IF:  You develop a rash, have difficulty breathing, or have any allergic problems with medications. Document Released: 11/14/2000 Document Revised: 02/09/2012 Document Reviewed: 11/20/2008 [facility] Patient Information 2014 [place], [place].
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
## 2013-05-26 NOTE — Progress Notes (Signed)
Patient here for chronic pain all over Back pain Neck pain Shoulder pain Foot pain Need to get into pain management

## 2013-05-30 ENCOUNTER — Other Ambulatory Visit: Payer: Medicaid Other

## 2013-05-31 NOTE — Telephone Encounter (Signed)
Pt called back and listed a new cell phone #[phone].  Wants to know about pain clinic referral.

## 2013-06-02 NOTE — Telephone Encounter (Signed)
I spoke to [name] and she was notify about the pain clinic decision . I sent a new referral to Pain Management waiting for an appointment.

## 2013-06-06 ENCOUNTER — Other Ambulatory Visit (INDEPENDENT_AMBULATORY_CARE_PROVIDER_SITE_OTHER): Payer: Medicaid Other

## 2013-06-06 DIAGNOSIS — E890 Postprocedural hypothyroidism: Secondary | ICD-10-CM

## 2013-06-06 LAB — TSH: TSH: 95.09 u[IU]/mL — ABNORMAL HIGH (ref 0.35–5.50)

## 2013-06-07 ENCOUNTER — Ambulatory Visit (INDEPENDENT_AMBULATORY_CARE_PROVIDER_SITE_OTHER): Payer: Medicaid Other | Admitting: *Deleted

## 2013-06-07 ENCOUNTER — Other Ambulatory Visit (INDEPENDENT_AMBULATORY_CARE_PROVIDER_SITE_OTHER): Payer: Medicaid Other

## 2013-06-07 ENCOUNTER — Other Ambulatory Visit (HOSPITAL_COMMUNITY)
Admission: RE | Admit: 2013-06-07 | Discharge: 2013-06-07 | Disposition: A | Discharge status: Home / Self Care | Payer: Medicaid Other | Source: Ambulatory Visit | Attending: Infectious Disease | Admitting: Infectious Disease

## 2013-06-07 DIAGNOSIS — Z01419 Encounter for gynecological examination (general) (routine) without abnormal findings: Secondary | ICD-10-CM | POA: Insufficient documentation

## 2013-06-07 DIAGNOSIS — B20 Human immunodeficiency virus [HIV] disease: Secondary | ICD-10-CM

## 2013-06-07 DIAGNOSIS — Z21 Asymptomatic human immunodeficiency virus [HIV] infection status: Secondary | ICD-10-CM

## 2013-06-07 DIAGNOSIS — Z124 Encounter for screening for malignant neoplasm of cervix: Secondary | ICD-10-CM

## 2013-06-07 DIAGNOSIS — Z113 Encounter for screening for infections with a predominantly sexual mode of transmission: Secondary | ICD-10-CM | POA: Insufficient documentation

## 2013-06-07 LAB — CBC WITH DIFFERENTIAL/PLATELET
Eosinophils Absolute: 0.1 10*3/uL (ref 0.0–0.7)
Hemoglobin: 13.6 g/dL (ref 12.0–15.0)
Lymphocytes Relative: 43 % (ref 12–46)
Lymphs Abs: 1.8 10*3/uL (ref 0.7–4.0)
MCH: 30.7 pg (ref 26.0–34.0)
Monocytes Relative: 5 % (ref 3–12)
Neutrophils Relative %: 48 % (ref 43–77)
RBC: 4.43 MIL/uL (ref 3.87–5.11)
WBC: 4.2 10*3/uL (ref 4.0–10.5)

## 2013-06-07 LAB — COMPLETE METABOLIC PANEL WITH GFR
ALT: 14 U/L (ref 0–35)
AST: 24 U/L (ref 0–37)
BUN: 12 mg/dL (ref 6–23)
CO2: 26 mEq/L (ref 19–32)
Creat: 1.52 mg/dL — ABNORMAL HIGH (ref 0.50–1.10)
GFR, Est African American: 43 mL/min — ABNORMAL LOW
Total Bilirubin: 2.1 mg/dL — ABNORMAL HIGH (ref 0.3–1.2)

## 2013-06-07 NOTE — Patient Instructions (Signed)
Your results will be ready in about a week.  I will mail them to you.  Thank you for coming to the Center for your care.  Mamie Diiorio,  RN 

## 2013-06-07 NOTE — Progress Notes (Signed)
  Subjective:     [name] is a 62 y.o. woman who comes in today for a  pap smear only.  Previous abnormal Pap smears: no. Contraception: condoms  Objective:    There were no vitals taken for this visit. Pelvic Exam:  Pap smear obtained.   Assessment:    Screening pap smear.   Plan:    Follow up in one year, or as indicated by Pap results.  Pt given educational materials re: HIV and diet, nutrition, exercise, BSE, health promotion, self-esteem and PAP smears.  Given condoms.

## 2013-06-08 LAB — T-HELPER CELL (CD4) - (RCID CLINIC ONLY)
CD4 % Helper T Cell: 46 % (ref 33–55)
CD4 T Cell Abs: 840 uL (ref 400–2700)

## 2013-06-08 LAB — HIV-1 RNA QUANT-NO REFLEX-BLD
HIV 1 RNA Quant: <20 copies/mL (ref <20)
HIV-1 RNA Quant, Log: <1.3 {Log} (ref <1.30)

## 2013-06-09 ENCOUNTER — Telehealth: Payer: Self-pay | Admitting: Family Medicine

## 2013-06-09 ENCOUNTER — Ambulatory Visit
Admission: RE | Admit: 2013-06-09 | Discharge: 2013-06-09 | Disposition: A | Discharge status: Home / Self Care | Payer: Medicaid Other | Source: Ambulatory Visit | Attending: Family Medicine | Admitting: Family Medicine

## 2013-06-09 ENCOUNTER — Encounter: Payer: Self-pay | Admitting: *Deleted

## 2013-06-09 DIAGNOSIS — R928 Other abnormal and inconclusive findings on diagnostic imaging of breast: Secondary | ICD-10-CM

## 2013-06-09 NOTE — Progress Notes (Signed)
Quick Note:  Please inform patient of results of mammogram: No mammographic or sonographic evidence of malignancy, left breast.  RECOMMENDATION: Screening mammogram in one year and perform monthly self breast examination and communicate any changes with primary physician.   Rodney Langton, MD, CDE, FAAFP [facility] [place], [place]   ______

## 2013-06-09 NOTE — Telephone Encounter (Signed)
Pt calling about pain management referral.  Says she contacted [facility] [place] and said they are taking new pts but need a referral.

## 2013-06-09 NOTE — Progress Notes (Signed)
Quick Note:  Please inform patient of mammogram and ultrasound of left breast : No mammographic or sonographic evidence of malignancy, left breast.  RECOMMENDATION: Screening mammogram in one year.   Rodney Langton, MD, CDE, FAAFP [facility] [place], [place]   ______

## 2013-06-09 NOTE — Telephone Encounter (Signed)
Left message to return our call.

## 2013-06-09 NOTE — Telephone Encounter (Signed)
Message copied by Lestine Mount on Thu Jun 09, 2013  4:35 PM ------      Message from: Cleora Fleet      Created: Thu Jun 09, 2013  4:11 PM       Please inform patient of results of mammogram:  No mammographic or sonographic evidence of malignancy, left breast.            RECOMMENDATION:      Screening mammogram in one year and  perform monthly self breast examination and communicate any changes with primary physician.                  Rodney Langton, MD, CDE, FAAFP      [facility]      [facility]      [place], [place]        ------

## 2013-06-14 NOTE — Telephone Encounter (Signed)
I spoke to [name] and she is aware that preferred pain management  pain clinic denied her and also [facility]ag pain clinic .

## 2013-06-23 ENCOUNTER — Ambulatory Visit: Payer: Medicaid Other | Admitting: Infectious Disease

## 2013-06-27 ENCOUNTER — Telehealth: Payer: Self-pay | Admitting: *Deleted

## 2013-06-27 ENCOUNTER — Telehealth: Payer: Self-pay

## 2013-06-27 NOTE — Telephone Encounter (Signed)
Pt left voicemail stating she did not receive lab results from 3 weeks ago, please advise [phone]

## 2013-06-27 NOTE — Telephone Encounter (Signed)
Patient called and advised she missed her appt last week and wants a new one. Gave her an appt for 08/08/13 at 11 am. She also advises she has a new rash on her face but will wait to see what's going to happen with it. Advised her if it does not go away she can call and be seen by the clinic doctor tried to give her an appt for tomorrow but she declined. Advised her to call us if it gets worse and she changes her mind.

## 2013-06-28 ENCOUNTER — Other Ambulatory Visit: Payer: Self-pay | Admitting: Endocrinology

## 2013-06-28 DIAGNOSIS — C73 Malignant neoplasm of thyroid gland: Secondary | ICD-10-CM

## 2013-06-28 NOTE — Telephone Encounter (Signed)
please call patient: i ordered radioactive iodine therapy.  You will need to go to x-ray on 2 different days.  Please resume the levothyroxine after the second time you go.  Please come back for a follow-up appointment in 6 weeks.

## 2013-06-28 NOTE — Telephone Encounter (Signed)
Sonya: TSH from 06/06/2013 was 95 >> definitely ready for RAI tx.  Dr Everardo All: Is it OK to order RAI tx and post Tx WBS?

## 2013-06-29 NOTE — Telephone Encounter (Signed)
Pt advised and states an understanding 

## 2013-07-15 ENCOUNTER — Emergency Department (HOSPITAL_COMMUNITY)
Admission: EM | Admit: 2013-07-15 | Discharge: 2013-07-15 | Disposition: A | Discharge status: Home / Self Care | Payer: Medicaid Other | Attending: Emergency Medicine | Admitting: Emergency Medicine

## 2013-07-15 ENCOUNTER — Encounter (HOSPITAL_COMMUNITY): Payer: Self-pay | Admitting: Emergency Medicine

## 2013-07-15 ENCOUNTER — Telehealth: Payer: Self-pay

## 2013-07-15 DIAGNOSIS — F3289 Other specified depressive episodes: Secondary | ICD-10-CM | POA: Insufficient documentation

## 2013-07-15 DIAGNOSIS — Z87828 Personal history of other (healed) physical injury and trauma: Secondary | ICD-10-CM | POA: Insufficient documentation

## 2013-07-15 DIAGNOSIS — F17200 Nicotine dependence, unspecified, uncomplicated: Secondary | ICD-10-CM | POA: Insufficient documentation

## 2013-07-15 DIAGNOSIS — C73 Malignant neoplasm of thyroid gland: Secondary | ICD-10-CM

## 2013-07-15 DIAGNOSIS — F411 Generalized anxiety disorder: Secondary | ICD-10-CM | POA: Insufficient documentation

## 2013-07-15 DIAGNOSIS — J449 Chronic obstructive pulmonary disease, unspecified: Secondary | ICD-10-CM | POA: Insufficient documentation

## 2013-07-15 DIAGNOSIS — G8929 Other chronic pain: Secondary | ICD-10-CM | POA: Insufficient documentation

## 2013-07-15 DIAGNOSIS — J4489 Other specified chronic obstructive pulmonary disease: Secondary | ICD-10-CM | POA: Insufficient documentation

## 2013-07-15 DIAGNOSIS — Z8739 Personal history of other diseases of the musculoskeletal system and connective tissue: Secondary | ICD-10-CM | POA: Insufficient documentation

## 2013-07-15 DIAGNOSIS — M2559 Pain in other specified joint: Secondary | ICD-10-CM | POA: Insufficient documentation

## 2013-07-15 DIAGNOSIS — Z872 Personal history of diseases of the skin and subcutaneous tissue: Secondary | ICD-10-CM | POA: Insufficient documentation

## 2013-07-15 DIAGNOSIS — F329 Major depressive disorder, single episode, unspecified: Secondary | ICD-10-CM | POA: Insufficient documentation

## 2013-07-15 DIAGNOSIS — Z8709 Personal history of other diseases of the respiratory system: Secondary | ICD-10-CM | POA: Insufficient documentation

## 2013-07-15 DIAGNOSIS — Z21 Asymptomatic human immunodeficiency virus [HIV] infection status: Secondary | ICD-10-CM | POA: Insufficient documentation

## 2013-07-15 DIAGNOSIS — Z85850 Personal history of malignant neoplasm of thyroid: Secondary | ICD-10-CM | POA: Insufficient documentation

## 2013-07-15 DIAGNOSIS — Z8701 Personal history of pneumonia (recurrent): Secondary | ICD-10-CM | POA: Insufficient documentation

## 2013-07-15 DIAGNOSIS — Z87898 Personal history of other specified conditions: Secondary | ICD-10-CM | POA: Insufficient documentation

## 2013-07-15 DIAGNOSIS — M2550 Pain in unspecified joint: Secondary | ICD-10-CM

## 2013-07-15 DIAGNOSIS — Z79899 Other long term (current) drug therapy: Secondary | ICD-10-CM | POA: Insufficient documentation

## 2013-07-15 MED ORDER — NAPROXEN 500 MG PO TABS: 500.0000 mg | ORAL_TABLET | Freq: Two times a day (BID) | ORAL | Status: DC

## 2013-07-15 MED ORDER — NAPROXEN 500 MG PO TABS
500.0000 mg | ORAL_TABLET | Freq: Once | ORAL | Status: DC
  Filled 2013-07-15: qty 1
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
## 2013-07-15 NOTE — Telephone Encounter (Signed)
[name] with nuclear medicine called needs order in epic for iodine therapy
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
## 2013-07-15 NOTE — Telephone Encounter (Signed)
i ordered

## 2013-07-18 ENCOUNTER — Encounter (HOSPITAL_COMMUNITY): Admission: RE | Admit: 2013-07-18 | Payer: Medicaid Other | Source: Ambulatory Visit

## 2013-07-21 ENCOUNTER — Encounter (HOSPITAL_COMMUNITY): Payer: Medicaid Other

## 2013-07-25 ENCOUNTER — Encounter (HOSPITAL_COMMUNITY): Admission: RE | Admit: 2013-07-25 | Payer: Medicaid Other | Source: Ambulatory Visit

## 2013-07-25 ENCOUNTER — Other Ambulatory Visit: Payer: Self-pay | Admitting: Endocrinology

## 2013-07-25 DIAGNOSIS — C73 Malignant neoplasm of thyroid gland: Secondary | ICD-10-CM

## 2013-07-27 ENCOUNTER — Encounter (HOSPITAL_COMMUNITY)
Admission: RE | Admit: 2013-07-27 | Discharge: 2013-07-27 | Disposition: A | Discharge status: Home / Self Care | Payer: Medicaid Other | Source: Ambulatory Visit | Attending: Endocrinology | Admitting: Endocrinology

## 2013-07-27 DIAGNOSIS — C73 Malignant neoplasm of thyroid gland: Secondary | ICD-10-CM | POA: Insufficient documentation

## 2013-07-27 MED ORDER — SODIUM IODIDE I 131 CAPSULE
78.5000 | Freq: Once | INTRAVENOUS | Status: AC | PRN
  Administered 2013-07-27: 78.5 via ORAL

## 2013-07-28 ENCOUNTER — Telehealth: Payer: Self-pay | Admitting: Endocrinology

## 2013-07-28 NOTE — Telephone Encounter (Signed)
See previous message

## 2013-08-08 ENCOUNTER — Other Ambulatory Visit: Payer: Self-pay | Admitting: *Deleted

## 2013-08-08 ENCOUNTER — Ambulatory Visit (INDEPENDENT_AMBULATORY_CARE_PROVIDER_SITE_OTHER): Payer: Medicaid Other | Admitting: Infectious Disease

## 2013-08-08 ENCOUNTER — Encounter: Payer: Self-pay | Admitting: Infectious Disease

## 2013-08-08 ENCOUNTER — Encounter (HOSPITAL_COMMUNITY): Payer: Medicaid Other

## 2013-08-08 VITALS — BP 92/65 | HR 55 | Temp 98.4°F | Wt 127.0 lb

## 2013-08-08 DIAGNOSIS — R630 Anorexia: Secondary | ICD-10-CM

## 2013-08-08 DIAGNOSIS — M25519 Pain in unspecified shoulder: Secondary | ICD-10-CM | POA: Insufficient documentation

## 2013-08-08 DIAGNOSIS — M25511 Pain in right shoulder: Secondary | ICD-10-CM

## 2013-08-08 DIAGNOSIS — B20 Human immunodeficiency virus [HIV] disease: Secondary | ICD-10-CM

## 2013-08-08 DIAGNOSIS — Z23 Encounter for immunization: Secondary | ICD-10-CM

## 2013-08-08 DIAGNOSIS — Z21 Asymptomatic human immunodeficiency virus [HIV] infection status: Secondary | ICD-10-CM

## 2013-08-08 LAB — COMPLETE METABOLIC PANEL WITH GFR
ALT: 24 U/L (ref 0–35)
AST: 32 U/L (ref 0–37)
Albumin: 5.1 g/dL (ref 3.5–5.2)
CO2: 28 mEq/L (ref 19–32)
Calcium: 9.8 mg/dL (ref 8.4–10.5)
Chloride: 102 mEq/L (ref 96–112)
GFR, Est African American: 48 mL/min — ABNORMAL LOW
Potassium: 3.8 mEq/L (ref 3.5–5.3)
Sodium: 139 mEq/L (ref 135–145)
Total Protein: 7.9 g/dL (ref 6.0–8.3)

## 2013-08-08 MED ORDER — MEGESTROL ACETATE 625 MG/5ML PO SUSP: 625.0000 mg | Freq: Every day | ORAL | Status: DC
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
## 2013-08-09 ENCOUNTER — Encounter (HOSPITAL_COMMUNITY)
Admission: RE | Admit: 2013-08-09 | Discharge: 2013-08-09 | Disposition: A | Discharge status: Home / Self Care | Payer: Medicaid Other | Source: Ambulatory Visit | Attending: Endocrinology | Admitting: Endocrinology

## 2013-08-09 DIAGNOSIS — C73 Malignant neoplasm of thyroid gland: Secondary | ICD-10-CM | POA: Insufficient documentation

## 2013-08-09 LAB — MICROALBUMIN / CREATININE URINE RATIO: Microalb Creat Ratio: 41.8 mg/g — ABNORMAL HIGH (ref 0.0–30.0)

## 2013-08-09 LAB — HIV-1 RNA ULTRAQUANT REFLEX TO GENTYP+: HIV 1 RNA Quant: 21 copies/mL (ref <20)

## 2013-08-10 ENCOUNTER — Telehealth: Payer: Self-pay

## 2013-08-10 NOTE — Telephone Encounter (Signed)
Please schedule pt an appt within the next few days

## 2013-08-12 ENCOUNTER — Telehealth: Payer: Self-pay | Admitting: Licensed Clinical Social Worker

## 2013-08-12 LAB — VITAMIN D PNL(25-HYDRXY+1,25-DIHY)-BLD
Vit D, 25-Hydroxy: 37 ng/mL (ref 30–89)
Vitamin D 1, 25 (OH)2 Total: 110 pg/mL — ABNORMAL HIGH (ref 18–72)
Vitamin D2 1, 25 (OH)2: <8 pg/mL
Vitamin D3 1, 25 (OH)2: 110 pg/mL

## 2013-08-12 LAB — HLA B*5701: HLA-B*5701: NEGATIVE

## 2013-08-12 NOTE — Telephone Encounter (Signed)
I called the patient's pcp that is listed on her medicaid card and they would not authorize the visit for me to schedule her MRI, she has not been to see them in over 1 year. The office advised the patient to call them  and schedule an appointment, then they would order the MRI if warranted. I notified the patient and she didn't understand, but I explained to her that she would need to get her card changed if she did not see them anymore. Patient also advised that she had a full body scan to check for cancer and she wanted to know if it would detect the problem with her shoulder. I told her I was not sure, but I didn't think it would.

## 2013-08-14 NOTE — Telephone Encounter (Signed)
MRI would be more sensitive. Lets get her PCP stuff straightened out and get the st8udy

## 2013-08-16 ENCOUNTER — Encounter: Payer: Self-pay | Admitting: Endocrinology

## 2013-08-16 ENCOUNTER — Ambulatory Visit (INDEPENDENT_AMBULATORY_CARE_PROVIDER_SITE_OTHER): Payer: Medicaid Other | Admitting: Endocrinology

## 2013-08-16 VITALS — BP 130/70 | HR 75 | Wt 133.0 lb

## 2013-08-16 DIAGNOSIS — C73 Malignant neoplasm of thyroid gland: Secondary | ICD-10-CM
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
## 2013-08-16 NOTE — Patient Instructions (Addendum)
Please continue the same thyroid medication.  Please come back for a follow-up appointment in 5 months.  Please call in 1 month of the symptoms on you hands persists, so we can check for carpal tunnel syndrome.  We can recheck the nuclear medicine scan in the spring, but you would not need to go off the levothyroxine for this one.

## 2013-09-05 ENCOUNTER — Telehealth: Payer: Self-pay | Admitting: Endocrinology

## 2013-09-05 ENCOUNTER — Telehealth: Payer: Self-pay | Admitting: *Deleted

## 2013-09-05 NOTE — Telephone Encounter (Signed)
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  RN stated that we would have to review it before filling it out.  RN suggested that the patient also have her endocrinologist fill out this form; pt claimed to not understand my advice.  Andree Coss, RN

## 2013-09-05 NOTE — Telephone Encounter (Signed)
please call patient: Please come in for ov, so we can recheck your thyroid

## 2013-09-07 NOTE — Telephone Encounter (Signed)
Will notify the patient.  I routed the message to Dr. Everardo All, too. He is asking her to come in for an appointment.

## 2013-09-07 NOTE — Telephone Encounter (Signed)
Her phone is not accepting calls at this time.

## 2013-09-07 NOTE — Telephone Encounter (Signed)
I dont think I can use that excuse about her gas. If her thyroid is out she should be on thyroid replacment and only if that is not properly managed would she be at risk for more symptoms related to temperature than others

## 2013-09-12 ENCOUNTER — Other Ambulatory Visit: Payer: Self-pay | Admitting: Licensed Clinical Social Worker

## 2013-09-12 ENCOUNTER — Telehealth: Payer: Self-pay | Admitting: Licensed Clinical Social Worker

## 2013-09-12 DIAGNOSIS — B20 Human immunodeficiency virus [HIV] disease: Secondary | ICD-10-CM

## 2013-09-12 MED ORDER — ENSURE PO LIQD: 237.0000 mL | Freq: Three times a day (TID) | ORAL | Status: DC

## 2013-09-12 NOTE — Telephone Encounter (Signed)
WE could consider  A different antiviral regimen in case her current one is adversely effecting her appetite. We can try marinol as well

## 2013-09-12 NOTE — Telephone Encounter (Signed)
Patient states that the Megace is not helping to increase her appetite, she is going to keep drinking Ensure. Patient would like to know if there is anything else she can take to increase her appetite

## 2013-09-12 NOTE — Telephone Encounter (Signed)
Left message pt to call the office and make an appointment

## 2013-09-13 NOTE — Telephone Encounter (Signed)
Very good 

## 2013-09-13 NOTE — Telephone Encounter (Signed)
I called the patient on both numbers listed for her and they were disconnected, I will have to wait for her to call back.

## 2013-09-13 NOTE — Telephone Encounter (Signed)
5mg  once daily should be starter dose

## 2013-09-13 NOTE — Telephone Encounter (Signed)
Ok, I think she wants to try Marinol first, what is the dose instructions?

## 2013-09-18 ENCOUNTER — Telehealth: Payer: Self-pay | Admitting: Endocrinology

## 2013-09-18 NOTE — Telephone Encounter (Signed)
please call patient: i received [employer] form i only see you for the thyroid, so please ask your pcp about this.

## 2013-09-23 NOTE — Telephone Encounter (Signed)
Tried calling pt back multiple times, na, no v-mail

## 2013-12-06 ENCOUNTER — Ambulatory Visit: Payer: Medicaid Other | Admitting: Infectious Disease

## 2013-12-20 ENCOUNTER — Other Ambulatory Visit: Payer: Self-pay | Admitting: *Deleted

## 2013-12-20 ENCOUNTER — Other Ambulatory Visit (INDEPENDENT_AMBULATORY_CARE_PROVIDER_SITE_OTHER): Payer: Medicaid Other

## 2013-12-20 DIAGNOSIS — B20 Human immunodeficiency virus [HIV] disease: Secondary | ICD-10-CM

## 2013-12-20 LAB — COMPLETE METABOLIC PANEL WITH GFR
ALK PHOS: 51 U/L (ref 39–117)
ALT: 20 U/L (ref 0–35)
AST: 14 U/L (ref 0–37)
Albumin: 4.2 g/dL (ref 3.5–5.2)
BUN: 20 mg/dL (ref 6–23)
CO2: 24 mEq/L (ref 19–32)
Calcium: 8.8 mg/dL (ref 8.4–10.5)
Chloride: 104 mEq/L (ref 96–112)
Creat: 1.33 mg/dL — ABNORMAL HIGH (ref 0.50–1.10)
GFR, EST NON AFRICAN AMERICAN: 43 mL/min — AB
GFR, Est African American: 50 mL/min — ABNORMAL LOW
GLUCOSE: 148 mg/dL — AB (ref 70–99)
Potassium: 3.7 mEq/L (ref 3.5–5.3)
Sodium: 138 mEq/L (ref 135–145)
Total Bilirubin: 0.6 mg/dL (ref 0.3–1.2)
Total Protein: 6.6 g/dL (ref 6.0–8.3)

## 2013-12-21 ENCOUNTER — Ambulatory Visit: Payer: Medicaid Other | Admitting: Endocrinology

## 2013-12-21 LAB — CBC WITH DIFFERENTIAL/PLATELET
Basophils Absolute: 0 10*3/uL (ref 0.0–0.1)
Basophils Relative: 0 % (ref 0–1)
Eosinophils Absolute: 0 10*3/uL (ref 0.0–0.7)
Eosinophils Relative: 0 % (ref 0–5)
HEMATOCRIT: 40.6 % (ref 36.0–46.0)
Hemoglobin: 13.8 g/dL (ref 12.0–15.0)
LYMPHS PCT: 17 % (ref 12–46)
Lymphs Abs: 1.9 10*3/uL (ref 0.7–4.0)
MCH: 33.6 pg (ref 26.0–34.0)
MCHC: 34 g/dL (ref 30.0–36.0)
MCV: 98.8 fL (ref 78.0–100.0)
MONO ABS: 0.7 10*3/uL (ref 0.1–1.0)
MONOS PCT: 7 % (ref 3–12)
NEUTROS ABS: 8 10*3/uL — AB (ref 1.7–7.7)
Neutrophils Relative %: 76 % (ref 43–77)
Platelets: 339 10*3/uL (ref 150–400)
RBC: 4.11 MIL/uL (ref 3.87–5.11)
RDW: 14.2 % (ref 11.5–15.5)
WBC: 10.6 10*3/uL — AB (ref 4.0–10.5)

## 2013-12-21 LAB — HIV-1 RNA QUANT-NO REFLEX-BLD: HIV 1 RNA Quant: <20 copies/mL (ref <20)

## 2013-12-21 LAB — T-HELPER CELL (CD4) - (RCID CLINIC ONLY)
CD4 % Helper T Cell: 40 % (ref 33–55)
CD4 T CELL ABS: 810 /uL (ref 400–2700)

## 2013-12-23 ENCOUNTER — Ambulatory Visit: Payer: Medicaid Other | Admitting: Endocrinology

## 2013-12-28 ENCOUNTER — Other Ambulatory Visit: Payer: Self-pay

## 2013-12-28 MED ORDER — LEVOTHYROXINE SODIUM 150 MCG PO TABS: 150.0000 ug | ORAL_TABLET | Freq: Every day | ORAL | Status: DC

## 2014-01-04 ENCOUNTER — Other Ambulatory Visit: Payer: Self-pay | Admitting: Licensed Clinical Social Worker

## 2014-01-04 DIAGNOSIS — B20 Human immunodeficiency virus [HIV] disease: Secondary | ICD-10-CM

## 2014-01-04 DIAGNOSIS — Z23 Encounter for immunization: Secondary | ICD-10-CM

## 2014-01-04 DIAGNOSIS — R630 Anorexia: Secondary | ICD-10-CM

## 2014-01-04 MED ORDER — MEGESTROL ACETATE 625 MG/5ML PO SUSP
625.0000 mg | Freq: Every day | ORAL | Status: DC
Start: 2014-01-04 — End: 2014-06-12

## 2014-01-12 ENCOUNTER — Ambulatory Visit (INDEPENDENT_AMBULATORY_CARE_PROVIDER_SITE_OTHER): Payer: Medicaid Other | Admitting: Infectious Disease

## 2014-01-12 ENCOUNTER — Encounter: Payer: Self-pay | Admitting: Infectious Disease

## 2014-01-12 VITALS — BP 121/82 | HR 77 | Temp 98.6°F | Wt 148.0 lb

## 2014-01-12 DIAGNOSIS — G894 Chronic pain syndrome: Secondary | ICD-10-CM

## 2014-01-12 DIAGNOSIS — Z21 Asymptomatic human immunodeficiency virus [HIV] infection status: Secondary | ICD-10-CM

## 2014-01-12 DIAGNOSIS — E039 Hypothyroidism, unspecified: Secondary | ICD-10-CM

## 2014-01-12 DIAGNOSIS — L299 Pruritus, unspecified: Secondary | ICD-10-CM

## 2014-01-12 DIAGNOSIS — C73 Malignant neoplasm of thyroid gland: Secondary | ICD-10-CM

## 2014-01-12 DIAGNOSIS — B20 Human immunodeficiency virus [HIV] disease: Secondary | ICD-10-CM

## 2014-01-12 MED ORDER — HYDROXYZINE HCL 25 MG PO TABS: 25.0000 mg | ORAL_TABLET | Freq: Three times a day (TID) | ORAL | Status: DC | PRN
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
## 2014-02-17 ENCOUNTER — Other Ambulatory Visit: Payer: Self-pay | Admitting: Licensed Clinical Social Worker

## 2014-02-17 DIAGNOSIS — L299 Pruritus, unspecified: Secondary | ICD-10-CM

## 2014-02-17 MED ORDER — HYDROXYZINE HCL 25 MG PO TABS: 25.0000 mg | ORAL_TABLET | Freq: Three times a day (TID) | ORAL | Status: DC | PRN

## 2014-04-11 ENCOUNTER — Encounter (HOSPITAL_COMMUNITY): Payer: Self-pay | Admitting: Emergency Medicine

## 2014-04-11 ENCOUNTER — Emergency Department (HOSPITAL_COMMUNITY): Payer: Medicaid Other

## 2014-04-11 ENCOUNTER — Emergency Department (HOSPITAL_COMMUNITY)
Admission: EM | Admit: 2014-04-11 | Discharge: 2014-04-11 | Disposition: A | Discharge status: Home / Self Care | Payer: Medicaid Other | Attending: Emergency Medicine | Admitting: Emergency Medicine

## 2014-04-11 DIAGNOSIS — Y9389 Activity, other specified: Secondary | ICD-10-CM | POA: Insufficient documentation

## 2014-04-11 DIAGNOSIS — S4980XA Other specified injuries of shoulder and upper arm, unspecified arm, initial encounter: Secondary | ICD-10-CM | POA: Diagnosis not present

## 2014-04-11 DIAGNOSIS — M25511 Pain in right shoulder: Secondary | ICD-10-CM

## 2014-04-11 DIAGNOSIS — M129 Arthropathy, unspecified: Secondary | ICD-10-CM | POA: Diagnosis not present

## 2014-04-11 DIAGNOSIS — S79919A Unspecified injury of unspecified hip, initial encounter: Secondary | ICD-10-CM | POA: Insufficient documentation

## 2014-04-11 DIAGNOSIS — S99919A Unspecified injury of unspecified ankle, initial encounter: Secondary | ICD-10-CM

## 2014-04-11 DIAGNOSIS — Z79899 Other long term (current) drug therapy: Secondary | ICD-10-CM | POA: Diagnosis not present

## 2014-04-11 DIAGNOSIS — F411 Generalized anxiety disorder: Secondary | ICD-10-CM | POA: Diagnosis not present

## 2014-04-11 DIAGNOSIS — F3289 Other specified depressive episodes: Secondary | ICD-10-CM | POA: Insufficient documentation

## 2014-04-11 DIAGNOSIS — Z8639 Personal history of other endocrine, nutritional and metabolic disease: Secondary | ICD-10-CM | POA: Insufficient documentation

## 2014-04-11 DIAGNOSIS — S8990XA Unspecified injury of unspecified lower leg, initial encounter: Secondary | ICD-10-CM | POA: Insufficient documentation

## 2014-04-11 DIAGNOSIS — Z862 Personal history of diseases of the blood and blood-forming organs and certain disorders involving the immune mechanism: Secondary | ICD-10-CM | POA: Diagnosis not present

## 2014-04-11 DIAGNOSIS — Z8701 Personal history of pneumonia (recurrent): Secondary | ICD-10-CM | POA: Insufficient documentation

## 2014-04-11 DIAGNOSIS — S79929A Unspecified injury of unspecified thigh, initial encounter: Secondary | ICD-10-CM

## 2014-04-11 DIAGNOSIS — J4489 Other specified chronic obstructive pulmonary disease: Secondary | ICD-10-CM | POA: Insufficient documentation

## 2014-04-11 DIAGNOSIS — Y92410 Unspecified street and highway as the place of occurrence of the external cause: Secondary | ICD-10-CM | POA: Diagnosis not present

## 2014-04-11 DIAGNOSIS — IMO0001 Reserved for inherently not codable concepts without codable children: Secondary | ICD-10-CM | POA: Diagnosis not present

## 2014-04-11 DIAGNOSIS — IMO0002 Reserved for concepts with insufficient information to code with codable children: Secondary | ICD-10-CM | POA: Diagnosis not present

## 2014-04-11 DIAGNOSIS — J449 Chronic obstructive pulmonary disease, unspecified: Secondary | ICD-10-CM | POA: Diagnosis not present

## 2014-04-11 DIAGNOSIS — Z85850 Personal history of malignant neoplasm of thyroid: Secondary | ICD-10-CM | POA: Insufficient documentation

## 2014-04-11 DIAGNOSIS — Z21 Asymptomatic human immunodeficiency virus [HIV] infection status: Secondary | ICD-10-CM | POA: Insufficient documentation

## 2014-04-11 DIAGNOSIS — S0993XA Unspecified injury of face, initial encounter: Secondary | ICD-10-CM | POA: Insufficient documentation

## 2014-04-11 DIAGNOSIS — M549 Dorsalgia, unspecified: Secondary | ICD-10-CM

## 2014-04-11 DIAGNOSIS — F329 Major depressive disorder, single episode, unspecified: Secondary | ICD-10-CM | POA: Insufficient documentation

## 2014-04-11 DIAGNOSIS — S99929A Unspecified injury of unspecified foot, initial encounter: Secondary | ICD-10-CM

## 2014-04-11 DIAGNOSIS — S46909A Unspecified injury of unspecified muscle, fascia and tendon at shoulder and upper arm level, unspecified arm, initial encounter: Secondary | ICD-10-CM | POA: Insufficient documentation

## 2014-04-11 DIAGNOSIS — F17200 Nicotine dependence, unspecified, uncomplicated: Secondary | ICD-10-CM | POA: Insufficient documentation

## 2014-04-11 DIAGNOSIS — S199XXA Unspecified injury of neck, initial encounter: Principal | ICD-10-CM

## 2014-04-11 DIAGNOSIS — M542 Cervicalgia: Secondary | ICD-10-CM

## 2014-04-11 MED ORDER — OXYCODONE-ACETAMINOPHEN 5-325 MG PO TABS
2.0000 | ORAL_TABLET | Freq: Once | ORAL | Status: AC
  Administered 2014-04-11: 2 via ORAL
  Filled 2014-04-11: qty 2

## 2014-04-11 NOTE — ED Notes (Addendum)
Offered patient prescribed percocet medication, patient states she "just needs to eat." MD occupied, will ask MD if patient is able to eat food when able to.
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
## 2014-04-11 NOTE — ED Notes (Signed)
Patient was educated not to drive, operate heavy machinery, or drink alcohol while taking narcotic medication.  
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
## 2014-04-11 NOTE — ED Notes (Signed)
Patient unable to sit vertical to take medications until spine is cleared from imaging. Pt transported to radiology.

## 2014-04-19 ENCOUNTER — Telehealth: Payer: Self-pay | Admitting: *Deleted

## 2014-04-19 ENCOUNTER — Other Ambulatory Visit: Payer: Self-pay | Admitting: *Deleted

## 2014-04-19 NOTE — Telephone Encounter (Signed)
Message left for the pt.  Is pt still experiencing itching? Does she need refill of hydroxyzine?

## 2014-04-19 NOTE — Telephone Encounter (Signed)
closed

## 2014-04-27 ENCOUNTER — Other Ambulatory Visit: Payer: Self-pay | Admitting: *Deleted

## 2014-04-27 DIAGNOSIS — L299 Pruritus, unspecified: Secondary | ICD-10-CM

## 2014-04-27 MED ORDER — HYDROXYZINE HCL 25 MG PO TABS: 25.0000 mg | ORAL_TABLET | Freq: Three times a day (TID) | ORAL | Status: DC | PRN

## 2014-05-15 ENCOUNTER — Other Ambulatory Visit: Payer: Self-pay | Admitting: *Deleted

## 2014-05-15 DIAGNOSIS — B20 Human immunodeficiency virus [HIV] disease: Secondary | ICD-10-CM

## 2014-05-15 DIAGNOSIS — F339 Major depressive disorder, recurrent, unspecified: Secondary | ICD-10-CM

## 2014-05-15 DIAGNOSIS — F411 Generalized anxiety disorder: Secondary | ICD-10-CM

## 2014-05-15 MED ORDER — EMTRICITABINE-TENOFOVIR DF 200-300 MG PO TABS: 1.0000 | ORAL_TABLET | Freq: Every day | ORAL | Status: DC

## 2014-05-15 MED ORDER — RITONAVIR 100 MG PO CAPS: 100.0000 mg | ORAL_CAPSULE | Freq: Every day | ORAL | Status: DC

## 2014-05-15 MED ORDER — ATAZANAVIR SULFATE 300 MG PO CAPS: 300.0000 mg | ORAL_CAPSULE | Freq: Every day | ORAL | Status: DC

## 2014-05-15 MED ORDER — CITALOPRAM HYDROBROMIDE 20 MG PO TABS: 20.0000 mg | ORAL_TABLET | Freq: Every day | ORAL | Status: DC

## 2014-06-12 ENCOUNTER — Other Ambulatory Visit: Payer: Self-pay | Admitting: *Deleted

## 2014-06-12 DIAGNOSIS — B20 Human immunodeficiency virus [HIV] disease: Secondary | ICD-10-CM

## 2014-06-12 DIAGNOSIS — R630 Anorexia: Secondary | ICD-10-CM

## 2014-06-12 DIAGNOSIS — Z23 Encounter for immunization: Secondary | ICD-10-CM

## 2014-06-12 MED ORDER — MEGESTROL ACETATE 625 MG/5ML PO SUSP: 625.0000 mg | Freq: Every day | ORAL | Status: DC

## 2014-06-28 ENCOUNTER — Other Ambulatory Visit: Payer: Medicaid Other

## 2014-07-06 ENCOUNTER — Telehealth: Payer: Self-pay | Admitting: *Deleted

## 2014-07-06 NOTE — Telephone Encounter (Signed)
I am ok with the hydroxyzine fairly harmless medicine. I she taking her ARVs that the bigger question

## 2014-07-06 NOTE — Telephone Encounter (Signed)
Received automatic request for refill from [place] for hydroxyzine rx.  Pt had upcoming appt on 07/12/14 w/ Dr. Tommy Medal.  Unable to reach pt by phone to find out if she continues to need medication. MD please discuss at pt's visit on Wed., Jul 12, 2014.

## 2014-07-12 ENCOUNTER — Ambulatory Visit: Payer: Medicaid Other | Admitting: Infectious Disease

## 2014-07-13 ENCOUNTER — Other Ambulatory Visit: Payer: Self-pay | Admitting: Licensed Clinical Social Worker

## 2014-07-13 DIAGNOSIS — B20 Human immunodeficiency virus [HIV] disease: Secondary | ICD-10-CM

## 2014-07-13 MED ORDER — ENSURE PO LIQD: 237.0000 mL | Freq: Three times a day (TID) | ORAL | Status: DC

## 2014-07-14 ENCOUNTER — Ambulatory Visit (INDEPENDENT_AMBULATORY_CARE_PROVIDER_SITE_OTHER): Payer: Medicaid Other | Admitting: *Deleted

## 2014-07-14 ENCOUNTER — Other Ambulatory Visit (HOSPITAL_COMMUNITY)
Admission: RE | Admit: 2014-07-14 | Discharge: 2014-07-14 | Disposition: A | Discharge status: Home / Self Care | Payer: Medicaid Other | Source: Ambulatory Visit | Attending: Infectious Disease | Admitting: Infectious Disease

## 2014-07-14 DIAGNOSIS — Z113 Encounter for screening for infections with a predominantly sexual mode of transmission: Secondary | ICD-10-CM | POA: Insufficient documentation

## 2014-07-14 DIAGNOSIS — Z01419 Encounter for gynecological examination (general) (routine) without abnormal findings: Secondary | ICD-10-CM | POA: Insufficient documentation

## 2014-07-14 DIAGNOSIS — Z1231 Encounter for screening mammogram for malignant neoplasm of breast: Secondary | ICD-10-CM

## 2014-07-14 DIAGNOSIS — Z124 Encounter for screening for malignant neoplasm of cervix: Secondary | ICD-10-CM

## 2014-07-14 NOTE — Progress Notes (Signed)
  Subjective:     [name] is a 63 y.o. woman who comes in today for a  pap smear only.  Previous abnormal Pap smears: no. Contraception:condoms.  Objective:    There were no vitals taken for this visit. Pelvic Exam: Pap smear obtained.   Assessment:    Screening pap smear.   Plan:    Follow up in one year, or as indicated by Pap results.  Pt given educational materials re: HIV and women, self-esteem, BSE, nutrition and diet management, PAP smears and partner safety. Pt given condoms.

## 2014-07-14 NOTE — Patient Instructions (Signed)
Your results will be ready in about a week.  I will mail them to you.  Thank you for coming to the Center today for your care.  Denise, RN 

## 2014-07-17 ENCOUNTER — Telehealth: Payer: Self-pay | Admitting: Licensed Clinical Social Worker

## 2014-07-17 DIAGNOSIS — B20 Human immunodeficiency virus [HIV] disease: Secondary | ICD-10-CM

## 2014-07-17 LAB — CYTOLOGY - PAP

## 2014-07-17 MED ORDER — ENSURE PO LIQD: 237.0000 mL | Freq: Three times a day (TID) | ORAL | Status: DC

## 2014-07-17 NOTE — Telephone Encounter (Signed)
Patient has numbness in her feet and nerve pain, she wants something called in for if possible. Please advise

## 2014-07-18 ENCOUNTER — Other Ambulatory Visit: Payer: Self-pay | Admitting: Licensed Clinical Social Worker

## 2014-07-18 MED ORDER — GABAPENTIN 300 MG PO CAPS: 300.0000 mg | ORAL_CAPSULE | Freq: Three times a day (TID) | ORAL | Status: DC

## 2014-07-18 NOTE — Telephone Encounter (Signed)
She can try gabapentin 300mg  at bedtime and we can escalate the dose. No narcotics

## 2014-07-18 NOTE — Telephone Encounter (Signed)
Ensure Rx was sent to [facility]. [name] with [facility] would like patient's BMI calculated at next office visit to see if there is a continued need for the Ensure. Patient informed of Rx for gabapentin. [name]

## 2014-07-19 ENCOUNTER — Encounter: Payer: Self-pay | Admitting: *Deleted

## 2014-08-02 ENCOUNTER — Other Ambulatory Visit: Payer: Medicaid Other

## 2014-08-10 ENCOUNTER — Emergency Department (HOSPITAL_COMMUNITY): Payer: Medicaid Other

## 2014-08-10 ENCOUNTER — Emergency Department (HOSPITAL_COMMUNITY)
Admission: EM | Admit: 2014-08-10 | Discharge: 2014-08-10 | Disposition: A | Discharge status: Home / Self Care | Payer: Medicaid Other | Attending: Emergency Medicine | Admitting: Emergency Medicine

## 2014-08-10 ENCOUNTER — Encounter (HOSPITAL_COMMUNITY): Payer: Self-pay | Admitting: Emergency Medicine

## 2014-08-10 DIAGNOSIS — Z85819 Personal history of malignant neoplasm of unspecified site of lip, oral cavity, and pharynx: Secondary | ICD-10-CM | POA: Diagnosis not present

## 2014-08-10 DIAGNOSIS — Z872 Personal history of diseases of the skin and subcutaneous tissue: Secondary | ICD-10-CM | POA: Diagnosis not present

## 2014-08-10 DIAGNOSIS — J069 Acute upper respiratory infection, unspecified: Secondary | ICD-10-CM

## 2014-08-10 DIAGNOSIS — F329 Major depressive disorder, single episode, unspecified: Secondary | ICD-10-CM | POA: Diagnosis not present

## 2014-08-10 DIAGNOSIS — R05 Cough: Secondary | ICD-10-CM | POA: Diagnosis present

## 2014-08-10 DIAGNOSIS — Z8701 Personal history of pneumonia (recurrent): Secondary | ICD-10-CM | POA: Insufficient documentation

## 2014-08-10 DIAGNOSIS — F3289 Other specified depressive episodes: Secondary | ICD-10-CM | POA: Insufficient documentation

## 2014-08-10 DIAGNOSIS — R059 Cough, unspecified: Secondary | ICD-10-CM | POA: Insufficient documentation

## 2014-08-10 DIAGNOSIS — J449 Chronic obstructive pulmonary disease, unspecified: Secondary | ICD-10-CM | POA: Diagnosis not present

## 2014-08-10 DIAGNOSIS — Z79899 Other long term (current) drug therapy: Secondary | ICD-10-CM | POA: Insufficient documentation

## 2014-08-10 DIAGNOSIS — Z21 Asymptomatic human immunodeficiency virus [HIV] infection status: Secondary | ICD-10-CM | POA: Diagnosis not present

## 2014-08-10 DIAGNOSIS — Z8739 Personal history of other diseases of the musculoskeletal system and connective tissue: Secondary | ICD-10-CM | POA: Insufficient documentation

## 2014-08-10 DIAGNOSIS — F17200 Nicotine dependence, unspecified, uncomplicated: Secondary | ICD-10-CM | POA: Insufficient documentation

## 2014-08-10 DIAGNOSIS — F411 Generalized anxiety disorder: Secondary | ICD-10-CM | POA: Diagnosis not present

## 2014-08-10 DIAGNOSIS — Z85850 Personal history of malignant neoplasm of thyroid: Secondary | ICD-10-CM | POA: Diagnosis not present

## 2014-08-10 DIAGNOSIS — J4489 Other specified chronic obstructive pulmonary disease: Secondary | ICD-10-CM | POA: Insufficient documentation

## 2014-08-10 DIAGNOSIS — L910 Hypertrophic scar: Secondary | ICD-10-CM | POA: Insufficient documentation

## 2014-08-10 DIAGNOSIS — R209 Unspecified disturbances of skin sensation: Secondary | ICD-10-CM | POA: Insufficient documentation

## 2014-08-10 MED ORDER — IPRATROPIUM-ALBUTEROL 0.5-2.5 (3) MG/3ML IN SOLN
3.0000 mL | Freq: Once | RESPIRATORY_TRACT | Status: AC
  Administered 2014-08-10: 3 mL via RESPIRATORY_TRACT
  Filled 2014-08-10: qty 3

## 2014-08-10 MED ORDER — AZITHROMYCIN 250 MG PO TABS
500.0000 mg | ORAL_TABLET | Freq: Once | ORAL | Status: AC
  Administered 2014-08-10: 500 mg via ORAL
  Filled 2014-08-10: qty 2

## 2014-08-10 MED ORDER — PREDNISONE 20 MG PO TABS
60.0000 mg | ORAL_TABLET | Freq: Once | ORAL | Status: AC
  Administered 2014-08-10: 60 mg via ORAL
  Filled 2014-08-10: qty 3

## 2014-08-10 MED ORDER — AZITHROMYCIN 250 MG PO TABS: 250.0000 mg | ORAL_TABLET | Freq: Every day | ORAL | Status: DC

## 2014-08-10 MED ORDER — PREDNISONE 20 MG PO TABS: 60.0000 mg | ORAL_TABLET | Freq: Every day | ORAL | Status: DC

## 2014-08-10 MED ORDER — ALBUTEROL SULFATE HFA 108 (90 BASE) MCG/ACT IN AERS: 2.0000 | INHALATION_SPRAY | RESPIRATORY_TRACT | Status: DC | PRN
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
## 2014-08-10 NOTE — Discharge Instructions (Signed)
Chronic Obstructive Pulmonary Disease  Chronic obstructive pulmonary disease (COPD) is a common lung condition in which airflow from the lungs is limited. COPD is a general term that can be used to describe many different lung problems that limit airflow, including both chronic bronchitis and emphysema. If you have COPD, your lung function will probably never return to normal, but there are measures you can take to improve lung function and make yourself feel better.   CAUSES    Smoking (common).    Exposure to secondhand smoke.    Genetic problems.   Chronic inflammatory lung diseases or recurrent infections.  SYMPTOMS    Shortness of breath, especially with physical activity.    Deep, persistent (chronic) cough with a large amount of thick mucus.    Wheezing.    Rapid breaths (tachypnea).    Gray or bluish discoloration (cyanosis) of the skin, especially in fingers, toes, or lips.    Fatigue.    Weight loss.    Frequent infections or episodes when breathing symptoms become much worse (exacerbations).    Chest tightness.  DIAGNOSIS   Your health care provider will take a medical history and perform a physical examination to make the initial diagnosis. Additional tests for COPD may include:    Lung (pulmonary) function tests.   Chest X-ray.   CT scan.   Blood tests.  TREATMENT   Treatment available to help you feel better when you have COPD includes:    Inhaler and nebulizer medicines. These help manage the symptoms of COPD and make your breathing more comfortable.   Supplemental oxygen. Supplemental oxygen is only helpful if you have a low oxygen level in your blood.    Exercise and physical activity. These are beneficial for nearly all people with COPD. Some people may also benefit from a pulmonary rehabilitation program.  HOME CARE INSTRUCTIONS    Take all medicines (inhaled or pills) as directed by your health care provider.   Avoid over-the-counter medicines or cough syrups  that dry up your airway (such as antihistamines) and slow down the elimination of secretions unless instructed otherwise by your health care provider.    If you are a smoker, the most important thing that you can do is stop smoking. Continuing to smoke will cause further lung damage and breathing trouble. Ask your health care provider for help with quitting smoking. He or she can direct you to community resources or hospitals that provide support.   Avoid exposure to irritants such as smoke, chemicals, and fumes that aggravate your breathing.   Use oxygen therapy and pulmonary rehabilitation if directed by your health care provider. If you require home oxygen therapy, ask your health care provider whether you should purchase a pulse oximeter to measure your oxygen level at home.    Avoid contact with individuals who have a contagious illness.   Avoid extreme temperature and humidity changes.   Eat healthy foods. Eating smaller, more frequent meals and resting before meals may help you maintain your strength.   Stay active, but balance activity with periods of rest. Exercise and physical activity will help you maintain your ability to do things you want to do.   Preventing infection and hospitalization is very important when you have COPD. Make sure to receive all the vaccines your health care provider recommends, especially the pneumococcal and influenza vaccines. Ask your health care provider whether you need a pneumonia vaccine.   Learn and use relaxation techniques to manage stress.   Learn   going to whistle and breathe out (exhale) through the pursed lips for 2 seconds.   °¨ Diaphragmatic breathing. Start by putting one hand on your abdomen just above  your waist. Inhale slowly through your nose. The hand on your abdomen should move out. Then purse your lips and exhale slowly. You should be able to feel the hand on your abdomen moving in as you exhale.   °· Learn and use controlled coughing to clear mucus from your lungs. Controlled coughing is a series of short, progressive coughs. The steps of controlled coughing are:   °1. Lean your head slightly forward.   °2. Breathe in deeply using diaphragmatic breathing.   °3. Try to hold your breath for 3 seconds.   °4. Keep your mouth slightly open while coughing twice.   °5. Spit any mucus out into a tissue.   °6. Rest and repeat the steps once or twice as needed. °SEEK MEDICAL CARE IF:  °· You are coughing up more mucus than usual.   °· There is a change in the color or thickness of your mucus.   °· Your breathing is more labored than usual.   °· Your breathing is faster than usual.   °SEEK IMMEDIATE MEDICAL CARE IF:  °· You have shortness of breath while you are resting.   °· You have shortness of breath that prevents you from: °¨ Being able to talk.   °¨ Performing your usual physical activities.   °· You have chest pain lasting longer than 5 minutes.   °· Your skin color is more cyanotic than usual. °· You measure low oxygen saturations for longer than 5 minutes with a pulse oximeter. °MAKE SURE YOU:  °· Understand these instructions. °· Will watch your condition. °· Will get help right away if you are not doing well or get worse. °Document Released: 08/27/2005 Document Revised: 04/03/2014 Document Reviewed: 07/14/2013 °ExitCare® Patient Information ©2015 [employer]. This information is not intended to replace advice given to you by your health care provider. Make sure you discuss any questions you have with your health care provider. °Upper Respiratory Infection, Adult °An upper respiratory infection (URI) is also sometimes known as the common cold. The upper respiratory tract includes the nose, sinuses, throat,  trachea, and bronchi. Bronchi are the airways leading to the lungs. Most people improve within 1 week, but symptoms can last up to 2 weeks. A residual cough may last even longer.  °CAUSES °Many different viruses can infect the tissues lining the upper respiratory tract. The tissues become irritated and inflamed and often become very moist. Mucus production is also common. A cold is contagious. You can easily spread the virus to others by oral contact. This includes kissing, sharing a glass, coughing, or sneezing. Touching your mouth or nose and then touching a surface, which is then touched by another person, can also spread the virus. °SYMPTOMS  °Symptoms typically develop 1 to 3 days after you come in contact with a cold virus. Symptoms vary from person to person. They may include: °· Runny nose. °· Sneezing. °· Nasal congestion. °· Sinus irritation. °· Sore throat. °· Loss of voice (laryngitis). °· Cough. °· Fatigue. °· Muscle aches. °· Loss of appetite. °· Headache. °· Low-grade fever. °DIAGNOSIS  °You might diagnose your own cold based on familiar symptoms, since most people get a cold 2 to 3 times a year. Your caregiver can confirm this based on your exam. Most importantly, your caregiver can check that your symptoms are not due to another disease such as strep throat, sinusitis, pneumonia, asthma, or epiglottitis. Blood tests,   throat tests, and X-rays are not necessary to diagnose a common cold, but they may sometimes be helpful in excluding other more serious diseases. Your caregiver will decide if any further tests are required. °RISKS AND COMPLICATIONS  °You may be at risk for a more severe case of the common cold if you smoke cigarettes, have chronic heart disease (such as heart failure) or lung disease (such as asthma), or if you have a weakened immune system. The very young and very old are also at risk for more serious infections. Bacterial sinusitis, middle ear infections, and bacterial pneumonia can  complicate the common cold. The common cold can worsen asthma and chronic obstructive pulmonary disease (COPD). Sometimes, these complications can require emergency medical care and may be life-threatening. °PREVENTION  °The best way to protect against getting a cold is to practice good hygiene. Avoid oral or hand contact with people with cold symptoms. Wash your hands often if contact occurs. There is no clear evidence that vitamin C, vitamin E, echinacea, or exercise reduces the chance of developing a cold. However, it is always recommended to get plenty of rest and practice good nutrition. °TREATMENT  °Treatment is directed at relieving symptoms. There is no cure. Antibiotics are not effective, because the infection is caused by a virus, not by bacteria. Treatment may include: °· Increased fluid intake. Sports drinks offer valuable electrolytes, sugars, and fluids. °· Breathing heated mist or steam (vaporizer or shower). °· Eating chicken soup or other clear broths, and maintaining good nutrition. °· Getting plenty of rest. °· Using gargles or lozenges for comfort. °· Controlling fevers with ibuprofen or acetaminophen as directed by your caregiver. °· Increasing usage of your inhaler if you have asthma. °Zinc gel and zinc lozenges, taken in the first 24 hours of the common cold, can shorten the duration and lessen the severity of symptoms. Pain medicines may help with fever, muscle aches, and throat pain. A variety of non-prescription medicines are available to treat congestion and runny nose. Your caregiver can make recommendations and may suggest nasal or lung inhalers for other symptoms.  °HOME CARE INSTRUCTIONS  °· Only take over-the-counter or prescription medicines for pain, discomfort, or fever as directed by your caregiver. °· Use a warm mist humidifier or inhale steam from a shower to increase air moisture. This may keep secretions moist and make it easier to breathe. °· Drink enough water and fluids to  keep your urine clear or pale yellow. °· Rest as needed. °· Return to work when your temperature has returned to normal or as your caregiver advises. You may need to stay home longer to avoid infecting others. You can also use a face mask and careful hand washing to prevent spread of the virus. °SEEK MEDICAL CARE IF:  °· After the first few days, you feel you are getting worse rather than better. °· You need your caregiver's advice about medicines to control symptoms. °· You develop chills, worsening shortness of breath, or [place] or red sputum. These may be signs of pneumonia. °· You develop yellow or [place] nasal discharge or pain in the face, especially when you bend forward. These may be signs of sinusitis. °· You develop a fever, swollen neck glands, pain with swallowing, or white areas in the back of your throat. These may be signs of strep throat. °SEEK IMMEDIATE MEDICAL CARE IF:  °· You have a fever. °· You develop severe or persistent headache, ear pain, sinus pain, or chest pain. °· You develop wheezing,   a prolonged cough, cough up blood, or have a change in your usual mucus (if you have chronic lung disease). °· You develop sore muscles or a stiff neck. °Document Released: 05/13/2001 Document Revised: 02/09/2012 Document Reviewed: 02/22/2014 °ExitCare® Patient Information ©2015 [employer]. This information is not intended to replace advice given to you by your health care provider. Make sure you discuss any questions you have with your health care provider. ° °

## 2014-08-10 NOTE — ED Notes (Signed)
Pt c/o productive cough with yellow sputum and chest congestion; pt sts pain in back when she coughs

## 2014-08-23 ENCOUNTER — Ambulatory Visit: Payer: Medicaid Other | Admitting: Infectious Disease

## 2014-09-13 ENCOUNTER — Telehealth: Payer: Self-pay | Admitting: *Deleted

## 2014-09-13 NOTE — Telephone Encounter (Signed)
Received Prior Authorization for Megace rx,, a non-preferred medication per Medicaid.  Preferred medication for this problem is Periactin 4 mg tablet per [place].  Marinol is also a non-preferred medication.  Pt's BMI is 20.98.

## 2014-10-04 ENCOUNTER — Telehealth: Payer: Self-pay | Admitting: *Deleted

## 2014-10-04 NOTE — Telephone Encounter (Signed)
Call from [facility] pharmacist stating megestrol is preferred over Megace; but this also needs a prior authorization. She is faxing over the form.

## 2014-10-06 ENCOUNTER — Telehealth: Payer: Self-pay | Admitting: Endocrinology

## 2014-10-06 ENCOUNTER — Ambulatory Visit: Payer: Medicaid Other | Admitting: Endocrinology

## 2014-10-06 NOTE — Telephone Encounter (Signed)
.  lbendo °

## 2014-10-06 NOTE — Telephone Encounter (Signed)
Patient no showed today's appt. Please advise on how to follow up. °A. No follow up necessary. °B. Follow up urgent. Contact patient immediately. °C. Follow up necessary. Contact patient and schedule visit in ___ days. °D. Follow up advised. Contact patient and schedule visit in ____weeks. ° °

## 2014-10-09 ENCOUNTER — Telehealth: Payer: Self-pay | Admitting: *Deleted

## 2014-10-09 NOTE — Telephone Encounter (Signed)
Sent it to [place] [facility] today

## 2014-10-09 NOTE — Telephone Encounter (Signed)
Needing PA for pt's Megace rx.  Spoke with [facility] pharmacist.  Will need to print the form and have the MD complete and sign PA form.  It will then need to be faxed.  Placed form for MD completion in his box in his office.

## 2014-10-10 ENCOUNTER — Telehealth: Payer: Self-pay | Admitting: *Deleted

## 2014-10-10 NOTE — Telephone Encounter (Signed)
Patient called requesting an Rx for Xanax, stating she had been on this previously and it helps her to rest. Advised patient she has an appointment next week on 10/16/14 and she can discuss this with the MD at that time.

## 2014-10-12 NOTE — Telephone Encounter (Signed)
Follow up advised. Contact patient and schedule visit in _1-2___weeks 

## 2014-10-13 NOTE — Telephone Encounter (Signed)
Lvom advising pt to call back and reschedule appointment. Letter mailed.

## 2014-10-16 ENCOUNTER — Ambulatory Visit: Payer: Medicaid Other | Admitting: Infectious Disease

## 2014-11-01 ENCOUNTER — Ambulatory Visit: Payer: Medicaid Other | Admitting: Infectious Disease

## 2014-11-14 ENCOUNTER — Other Ambulatory Visit: Payer: Self-pay | Admitting: *Deleted

## 2014-11-14 DIAGNOSIS — B20 Human immunodeficiency virus [HIV] disease: Secondary | ICD-10-CM

## 2014-11-14 MED ORDER — RITONAVIR 100 MG PO TABS: 100.0000 mg | ORAL_TABLET | Freq: Every day | ORAL | Status: DC

## 2014-11-28 ENCOUNTER — Emergency Department (HOSPITAL_COMMUNITY)
Admission: EM | Admit: 2014-11-28 | Discharge: 2014-11-28 | Disposition: A | Discharge status: Home / Self Care | Payer: Medicaid Other | Attending: Emergency Medicine | Admitting: Emergency Medicine

## 2014-11-28 ENCOUNTER — Encounter (HOSPITAL_COMMUNITY): Payer: Self-pay | Admitting: *Deleted

## 2014-11-28 DIAGNOSIS — N760 Acute vaginitis: Secondary | ICD-10-CM | POA: Insufficient documentation

## 2014-11-28 DIAGNOSIS — Z8639 Personal history of other endocrine, nutritional and metabolic disease: Secondary | ICD-10-CM | POA: Insufficient documentation

## 2014-11-28 DIAGNOSIS — Z21 Asymptomatic human immunodeficiency virus [HIV] infection status: Secondary | ICD-10-CM | POA: Insufficient documentation

## 2014-11-28 DIAGNOSIS — N390 Urinary tract infection, site not specified: Secondary | ICD-10-CM | POA: Insufficient documentation

## 2014-11-28 DIAGNOSIS — F419 Anxiety disorder, unspecified: Secondary | ICD-10-CM | POA: Insufficient documentation

## 2014-11-28 DIAGNOSIS — A5901 Trichomonal vulvovaginitis: Secondary | ICD-10-CM | POA: Insufficient documentation

## 2014-11-28 DIAGNOSIS — F329 Major depressive disorder, single episode, unspecified: Secondary | ICD-10-CM | POA: Insufficient documentation

## 2014-11-28 DIAGNOSIS — M542 Cervicalgia: Secondary | ICD-10-CM | POA: Diagnosis not present

## 2014-11-28 DIAGNOSIS — Z8701 Personal history of pneumonia (recurrent): Secondary | ICD-10-CM | POA: Diagnosis not present

## 2014-11-28 DIAGNOSIS — Z720 Tobacco use: Secondary | ICD-10-CM | POA: Diagnosis not present

## 2014-11-28 DIAGNOSIS — Z792 Long term (current) use of antibiotics: Secondary | ICD-10-CM | POA: Insufficient documentation

## 2014-11-28 DIAGNOSIS — G8929 Other chronic pain: Secondary | ICD-10-CM | POA: Diagnosis not present

## 2014-11-28 DIAGNOSIS — Z7952 Long term (current) use of systemic steroids: Secondary | ICD-10-CM | POA: Insufficient documentation

## 2014-11-28 DIAGNOSIS — Z85850 Personal history of malignant neoplasm of thyroid: Secondary | ICD-10-CM | POA: Insufficient documentation

## 2014-11-28 DIAGNOSIS — R52 Pain, unspecified: Secondary | ICD-10-CM | POA: Diagnosis present

## 2014-11-28 DIAGNOSIS — Z79899 Other long term (current) drug therapy: Secondary | ICD-10-CM | POA: Insufficient documentation

## 2014-11-28 DIAGNOSIS — B9689 Other specified bacterial agents as the cause of diseases classified elsewhere: Secondary | ICD-10-CM

## 2014-11-28 DIAGNOSIS — M1990 Unspecified osteoarthritis, unspecified site: Secondary | ICD-10-CM | POA: Diagnosis not present

## 2014-11-28 DIAGNOSIS — J449 Chronic obstructive pulmonary disease, unspecified: Secondary | ICD-10-CM | POA: Diagnosis not present

## 2014-11-28 DIAGNOSIS — M797 Fibromyalgia: Secondary | ICD-10-CM | POA: Diagnosis not present

## 2014-11-28 DIAGNOSIS — A599 Trichomoniasis, unspecified: Secondary | ICD-10-CM

## 2014-11-28 LAB — WET PREP, GENITAL: Yeast Wet Prep HPF POC: NONE SEEN

## 2014-11-28 LAB — URINALYSIS, ROUTINE W REFLEX MICROSCOPIC
Bilirubin Urine: NEGATIVE
Glucose, UA: NEGATIVE mg/dL
Ketones, ur: NEGATIVE mg/dL
Nitrite: NEGATIVE
PROTEIN: NEGATIVE mg/dL
Specific Gravity, Urine: 1.019 (ref 1.005–1.030)
Urobilinogen, UA: 0.2 mg/dL (ref 0.0–1.0)
pH: 5 (ref 5.0–8.0)

## 2014-11-28 LAB — URINE MICROSCOPIC-ADD ON

## 2014-11-28 MED ORDER — METRONIDAZOLE 500 MG PO TABS
2000.0000 mg | ORAL_TABLET | ORAL | Status: AC
  Administered 2014-11-28: 2000 mg via ORAL
  Filled 2014-11-28: qty 4

## 2014-11-28 MED ORDER — STERILE WATER FOR INJECTION IJ SOLN
INTRAMUSCULAR | Status: AC
  Filled 2014-11-28: qty 10

## 2014-11-28 MED ORDER — OXYCODONE-ACETAMINOPHEN 5-325 MG PO TABS
1.0000 | ORAL_TABLET | Freq: Once | ORAL | Status: AC
  Administered 2014-11-28: 1 via ORAL
  Filled 2014-11-28: qty 1

## 2014-11-28 MED ORDER — AZITHROMYCIN 250 MG PO TABS
1000.0000 mg | ORAL_TABLET | Freq: Once | ORAL | Status: AC
  Administered 2014-11-28: 1000 mg via ORAL
  Filled 2014-11-28: qty 4

## 2014-11-28 MED ORDER — ALBUTEROL SULFATE HFA 108 (90 BASE) MCG/ACT IN AERS
2.0000 | INHALATION_SPRAY | Freq: Once | RESPIRATORY_TRACT | Status: AC
  Administered 2014-11-28: 2 via RESPIRATORY_TRACT
  Filled 2014-11-28: qty 6.7

## 2014-11-28 MED ORDER — CIPROFLOXACIN HCL 250 MG PO TABS: 250.0000 mg | ORAL_TABLET | Freq: Two times a day (BID) | ORAL | Status: DC

## 2014-11-28 MED ORDER — CEFTRIAXONE SODIUM 250 MG IJ SOLR
250.0000 mg | Freq: Once | INTRAMUSCULAR | Status: AC
  Administered 2014-11-28: 250 mg via INTRAMUSCULAR
  Filled 2014-11-28: qty 250

## 2014-11-28 MED ORDER — METRONIDAZOLE 500 MG PO TABS: 500.0000 mg | ORAL_TABLET | Freq: Two times a day (BID) | ORAL | Status: DC

## 2014-11-28 NOTE — ED Provider Notes (Signed)
CSN: [id number]     Arrival date & time 11/28/14  1035 History   First MD Initiated Contact with Patient 11/28/14 1134     Chief Complaint  Patient presents with  . Generalized Body Aches     (Consider location/radiation/quality/duration/timing/severity/associated sxs/prior Treatment) Patient is a 63 y.o. female presenting with female genitourinary complaint. The history is provided by the patient.  Female GU Problem This is a new problem. Episode onset: 2-3 days ago. The problem occurs constantly. The problem has not changed since onset.Pertinent negatives include no chest pain, no abdominal pain, no headaches and no shortness of breath. Nothing aggravates the symptoms. Nothing relieves the symptoms. She has tried nothing for the symptoms. The treatment provided no relief.    Past Medical History  Diagnosis Date  . Arthritis   . COPD (chronic obstructive pulmonary disease)   . Lipoma     right forearm  . Headache(784.0)   . Anxiety   . Depression   . HIV (human immunodeficiency virus infection)     "dx'd ~ 12/2011"  . Chronic bronchitis   . Pneumonia ~ 2000's    "once"  . Shortness of breath on exertion   . 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 Fibromyalgia     "I think I got that"  . Thyroid cancer s/p total thyroidectomy April2013 02/06/2012    Per patient report.  Recently diagnosed. Awaiting procedure to have thyroid removed.  Under treatment with Dr Janace Hoard .   Marland Kitchen Asthma    Past Surgical History  Procedure Laterality Date  . Thyroidectomy  03/11/12    bilaterally  . Anterior cervical decomp/discectomy fusion  05/2009    C3-4; C5-6  . Dilation and curettage of uterus    . Thyroidectomy  03/11/2012    Procedure: THYROIDECTOMY;  Surgeon: Melissa Montane, MD;  Location: [facility] OR;  Service: ENT;  Laterality: Bilateral;   Family History  Problem Relation Age of Onset  . Heart disease Maternal Uncle    History  Substance Use Topics  . Smoking status: Current Every Day Smoker  -- 1.00 packs/day for 47 years    Types: Cigarettes  . Smokeless tobacco: Former Systems developer    Quit date: 12/01/1974     Comment: currently smokes   . Alcohol Use: No     Comment: 03/11/12 "no beer or wine ~ 01/2012"   OB History    No data available     Review of Systems  Constitutional: Negative for fever and fatigue.  HENT: Negative for congestion and drooling.   Eyes: Negative for pain.  Respiratory: Negative for cough and shortness of breath.   Cardiovascular: Negative for chest pain.  Gastrointestinal: Negative for nausea, vomiting, abdominal pain and diarrhea.  Genitourinary: Positive for dysuria (occasionally) and vaginal discharge. Negative for hematuria.  Musculoskeletal: Positive for arthralgias and neck pain. Negative for back pain and gait problem.  Skin: Negative for color change.  Neurological: Negative for dizziness and headaches.  Hematological: Negative for adenopathy.  Psychiatric/Behavioral: Negative for behavioral problems.  All other systems reviewed and are negative.     Allergies  Sulfa antibiotics  Home Medications   Prior to Admission medications   Medication Sig Start Date End Date Taking? Authorizing Provider  albuterol (PROVENTIL HFA;VENTOLIN HFA) 108 (90 BASE) MCG/ACT inhaler Inhale 2 puffs into the lungs every 4 (four) hours as needed for wheezing or shortness of breath. 08/10/14   Kristen N Ward, DO  atazanavir (REYATAZ) 300 MG capsule Take 1 capsule (300 mg  total) by mouth daily with breakfast. 05/15/14   Truman Hayward, MD  azithromycin (ZITHROMAX) 250 MG tablet Take 1 tablet (250 mg total) by mouth daily. Start on 08/11/14 08/10/14   Kristen N Ward, DO  citalopram (CELEXA) 20 MG tablet Take 1 tablet (20 mg total) by mouth daily. 05/15/14 05/15/15  Truman Hayward, MD  emtricitabine-tenofovir (TRUVADA) 200-300 MG per tablet Take 1 tablet by mouth daily. 05/15/14   Truman Hayward, MD  ENSURE (ENSURE) Take 237 mLs by mouth 3 (three) times daily  between meals. 07/17/14   Truman Hayward, MD  gabapentin (NEURONTIN) 300 MG capsule Take 1 capsule (300 mg total) by mouth 3 (three) times daily. 07/18/14   Truman Hayward, MD  hydrOXYzine (ATARAX/VISTARIL) 25 MG tablet Take 1 tablet (25 mg total) by mouth every 8 (eight) hours as needed for itching. 04/27/14   Truman Hayward, MD  levothyroxine (SYNTHROID, LEVOTHROID) 150 MCG tablet Take 1 tablet (150 mcg total) by mouth daily. 12/28/13   Renato Shin, MD  megestrol (MEGACE ES) 625 MG/5ML suspension Take 5 mLs (625 mg total) by mouth daily. 06/12/14   Truman Hayward, MD  predniSONE (DELTASONE) 20 MG tablet Take 3 tablets (60 mg total) by mouth daily. 08/10/14   Kristen N Ward, DO  ritonavir (NORVIR) 100 MG TABS tablet Take 1 tablet (100 mg total) by mouth daily. 11/14/14   Truman Hayward, MD   BP 113/75 mmHg  Pulse 64  Temp(Src) 97.9 F (36.6 C) (Oral)  Resp 17  SpO2 96% Physical Exam  Constitutional: She is oriented to person, place, and time. She appears well-developed and well-nourished.  HENT:  Head: Normocephalic and atraumatic.  Mouth/Throat: Oropharynx is clear and moist. No oropharyngeal exudate.  Eyes: Conjunctivae and EOM are normal. Pupils are equal, round, and reactive to light.  Neck: Normal range of motion. Neck supple.  Cardiovascular: Normal rate, regular rhythm, normal heart sounds and intact distal pulses.  Exam reveals no gallop and no friction rub.   No murmur heard. Pulmonary/Chest: Effort normal and breath sounds normal. No respiratory distress. She has no wheezes.  Abdominal: Soft. Bowel sounds are normal. There is no tenderness. There is no rebound and no guarding.  Genitourinary:  Normal-appearing external vagina. Normal-appearing cervix. Os closed. Moderate amount of milky white fluid in the posterior fornix. No cervical motion tenderness or adnexal tenderness during the bimanual exam.  Musculoskeletal: Normal range of motion. She exhibits no  edema or tenderness.  Neurological: She is alert and oriented to person, place, and time.  Skin: Skin is warm and dry.  Psychiatric: She has a normal mood and affect. Her behavior is normal.  Nursing note and vitals reviewed.   ED Course  Procedures (including critical care time) Labs Review Labs Reviewed  WET PREP, GENITAL - Abnormal; Notable for the following:    Trich, Wet Prep TOO NUMEROUS TO COUNT (*)    Clue Cells Wet Prep HPF POC MANY (*)    WBC, Wet Prep HPF POC TOO NUMEROUS TO COUNT (*)    All other components within normal limits  URINALYSIS, ROUTINE W REFLEX MICROSCOPIC - Abnormal; Notable for the following:    APPearance CLOUDY (*)    Hgb urine dipstick TRACE (*)    Leukocytes, UA LARGE (*)    All other components within normal limits  URINE MICROSCOPIC-ADD ON - Abnormal; Notable for the following:    Squamous Epithelial / LPF  MANY (*)    Bacteria, UA MANY (*)    All other components within normal limits  GC/CHLAMYDIA PROBE AMP  URINE CULTURE    Imaging Review No results found.   EKG Interpretation None      MDM   Final diagnoses:  Trichomoniasis  BV (bacterial vaginosis)  UTI (lower urinary tract infection)    11:56 AM 63 y.o. female w hx of HIV who presents with foul-smelling vaginal discharge which began approximately 3 days ago. She states that she last had sex approximately 5-6 days ago. She states that she had sex with a known partner who also has HIV. The nursing notes state that the patient has had generalized body aches but on exam she states that she has chronic joint and neck pains. These pains are unchanged from baseline. She denies any fevers, vomiting, or diarrhea. She is afebrile and vital signs are unremarkable here.  2:29 PM: Empirically treated w/ flagyl, azithro, rocephin. Found to have trich. Will tx for BV and UTI as well.  I have discussed the diagnosis/risks/treatment options with the patient and believe the pt to be eligible for  discharge home to follow-up with her pcp as needed. We also discussed returning to the ED immediately if new or worsening sx occur. We discussed the sx which are most concerning (e.g., abd/pelvic pain, fever, continued vag d/c) that necessitate immediate return. Medications administered to the patient during their visit and any new prescriptions provided to the patient are listed below.  Medications given during this visit Medications  sterile water (preservative free) injection (not administered)  albuterol (PROVENTIL HFA;VENTOLIN HFA) 108 (90 BASE) MCG/ACT inhaler 2 puff (not administered)  oxyCODONE-acetaminophen (PERCOCET/ROXICET) 5-325 MG per tablet 1 tablet (1 tablet Oral Given 11/28/14 1205)  cefTRIAXone (ROCEPHIN) injection 250 mg (250 mg Intramuscular Given 11/28/14 1211)  azithromycin (ZITHROMAX) tablet 1,000 mg (1,000 mg Oral Given 11/28/14 1210)  metroNIDAZOLE (FLAGYL) tablet 2,000 mg (2,000 mg Oral Given 11/28/14 1254)    Discharge Medication List as of 11/28/2014  2:31 PM    START taking these medications   Details  ciprofloxacin (CIPRO) 250 MG tablet Take 1 tablet (250 mg total) by mouth 2 (two) times daily., Starting 11/28/2014, Until Discontinued, Print    metroNIDAZOLE (FLAGYL) 500 MG tablet Take 1 tablet (500 mg total) by mouth 2 (two) times daily. One po bid x 7 days, Starting 11/28/2014, Until Discontinued, Print         Pamella Pert, MD 11/28/14 [phone]

## 2014-11-28 NOTE — ED Notes (Signed)
C/o generalized bodyaches  Also c/o heavy vaginal discharge with odor. States she is sexually active and doesn't use protection

## 2014-11-28 NOTE — ED Notes (Signed)
Aline Brochure MD at bedside.

## 2014-11-29 LAB — URINE CULTURE
COLONY COUNT: NO GROWTH
Culture: NO GROWTH

## 2014-11-29 LAB — GC/CHLAMYDIA PROBE AMP
CT PROBE, AMP APTIMA: NEGATIVE
GC Probe RNA: NEGATIVE

## 2014-12-26 ENCOUNTER — Telehealth: Payer: Self-pay | Admitting: Licensed Clinical Social Worker

## 2014-12-26 NOTE — Telephone Encounter (Signed)
Patient complaining of itching and burning on her upper body, along with joint pain and swelling. She contributes this to her medication, I have scheduled her an appointment next week in our clinic.

## 2014-12-28 ENCOUNTER — Other Ambulatory Visit: Payer: Self-pay | Admitting: Licensed Clinical Social Worker

## 2014-12-28 MED ORDER — GABAPENTIN 300 MG PO CAPS: 300.0000 mg | ORAL_CAPSULE | Freq: Three times a day (TID) | ORAL | Status: DC

## 2015-01-02 ENCOUNTER — Encounter: Payer: Self-pay | Admitting: Infectious Diseases

## 2015-01-02 ENCOUNTER — Telehealth: Payer: Self-pay | Admitting: Endocrinology

## 2015-01-02 ENCOUNTER — Other Ambulatory Visit (HOSPITAL_COMMUNITY)
Admission: RE | Admit: 2015-01-02 | Discharge: 2015-01-02 | Disposition: A | Discharge status: Home / Self Care | Payer: Medicaid Other | Source: Ambulatory Visit | Attending: Infectious Diseases | Admitting: Infectious Diseases

## 2015-01-02 ENCOUNTER — Ambulatory Visit (INDEPENDENT_AMBULATORY_CARE_PROVIDER_SITE_OTHER): Payer: Medicaid Other | Admitting: Infectious Diseases

## 2015-01-02 VITALS — BP 114/73 | HR 76 | Temp 97.4°F | Ht 68.0 in | Wt 153.0 lb

## 2015-01-02 DIAGNOSIS — Z79899 Other long term (current) drug therapy: Secondary | ICD-10-CM

## 2015-01-02 DIAGNOSIS — C73 Malignant neoplasm of thyroid gland: Secondary | ICD-10-CM

## 2015-01-02 DIAGNOSIS — Z113 Encounter for screening for infections with a predominantly sexual mode of transmission: Secondary | ICD-10-CM

## 2015-01-02 DIAGNOSIS — B20 Human immunodeficiency virus [HIV] disease: Secondary | ICD-10-CM

## 2015-01-02 DIAGNOSIS — Z23 Encounter for immunization: Secondary | ICD-10-CM

## 2015-01-02 DIAGNOSIS — F411 Generalized anxiety disorder: Secondary | ICD-10-CM

## 2015-01-02 DIAGNOSIS — Z21 Asymptomatic human immunodeficiency virus [HIV] infection status: Secondary | ICD-10-CM

## 2015-01-02 LAB — CBC
HCT: 41.1 % (ref 36.0–46.0)
Hemoglobin: 14 g/dL (ref 12.0–15.0)
MCH: 32.2 pg (ref 26.0–34.0)
MCHC: 34.1 g/dL (ref 30.0–36.0)
MCV: 94.5 fL (ref 78.0–100.0)
MPV: 10.2 fL (ref 8.6–12.4)
Platelets: 297 10*3/uL (ref 150–400)
RBC: 4.35 MIL/uL (ref 3.87–5.11)
RDW: 12.6 % (ref 11.5–15.5)
WBC: 5.7 10*3/uL (ref 4.0–10.5)

## 2015-01-02 LAB — COMPREHENSIVE METABOLIC PANEL
ALK PHOS: 71 U/L (ref 39–117)
ALT: 10 U/L (ref 0–35)
AST: 17 U/L (ref 0–37)
Albumin: 4.1 g/dL (ref 3.5–5.2)
BUN: 10 mg/dL (ref 6–23)
CO2: 20 meq/L (ref 19–32)
Calcium: 9.6 mg/dL (ref 8.4–10.5)
Chloride: 106 mEq/L (ref 96–112)
Creat: 1.09 mg/dL (ref 0.50–1.10)
GLUCOSE: 79 mg/dL (ref 70–99)
POTASSIUM: 4 meq/L (ref 3.5–5.3)
Sodium: 138 mEq/L (ref 135–145)
TOTAL PROTEIN: 7.3 g/dL (ref 6.0–8.3)
Total Bilirubin: 1.9 mg/dL — ABNORMAL HIGH (ref 0.2–1.2)

## 2015-01-02 LAB — LIPID PANEL
Cholesterol: 177 mg/dL (ref 0–200)
HDL: 31 mg/dL — AB (ref >39)
LDL CALC: 106 mg/dL — AB (ref 0–99)
Total CHOL/HDL Ratio: 5.7 Ratio
Triglycerides: 201 mg/dL — ABNORMAL HIGH (ref <150)
VLDL: 40 mg/dL (ref 0–40)

## 2015-01-02 NOTE — Progress Notes (Signed)
   Subjective:    Patient ID: [name], female    DOB: May 15, 1951, 64 y.o.   MRN: [id number]  HPI 64 yo F with hx of HIV+ on ATVr/TRV. She has a hx of thyroid cancer, thyroidectomy (April 2014). She also has a hx of pruritis, has been on hydroxyzine.  Also with hx of chronic pain, rx not written at [facility].  Has been having numbness and burning in her fingers, toes and joints.  Previous neck surgery- 4 years ago.  Previous psychiatric eval (depression, anxiety).   HIV 1 RNA QUANT (copies/mL)  Date Value  12/20/2013 <20  08/08/2013 21  06/07/2013 <20   CD4 T CELL ABS  Date Value  12/20/2013 810 /uL  08/08/2013 530 /uL  06/07/2013 840 cmm    Review of Systems  Constitutional: Negative for appetite change and unexpected weight change.  Gastrointestinal: Negative for diarrhea and constipation.  Genitourinary: Negative for difficulty urinating.       Objective:   Physical Exam  Constitutional: She appears well-developed and well-nourished.  HENT:  Mouth/Throat: No oropharyngeal exudate.  Eyes: EOM are normal. Pupils are equal, round, and reactive to light.  Neck: Neck supple.  Cardiovascular: Normal rate, regular rhythm and normal heart sounds.   Pulmonary/Chest: Effort normal and breath sounds normal.  Abdominal: Soft. Bowel sounds are normal. She exhibits no distension. There is no tenderness.  Lymphadenopathy:    She has no cervical adenopathy.          Assessment & Plan:

## 2015-01-02 NOTE — Assessment & Plan Note (Addendum)
Will schedule pap, mammo. Will stop azithro, see no need for chronic.  Needs flu shot.  Given condoms.  Will recheck her labs.  Get her into dental clinic.  Get her into PCP clinic.

## 2015-01-02 NOTE — Assessment & Plan Note (Signed)
Will check TSH but suggest that she has major underlying psych dx.

## 2015-01-02 NOTE — Assessment & Plan Note (Signed)
Will need to f/u with Dr Loanne Drilling. Will see if we can schedule this.

## 2015-01-02 NOTE — Telephone Encounter (Signed)
please call patient: F/u ov needed

## 2015-01-02 NOTE — Telephone Encounter (Signed)
Pt scheduled for 01/12/2015 at 3 pm.

## 2015-01-03 ENCOUNTER — Other Ambulatory Visit: Payer: Self-pay | Admitting: Internal Medicine

## 2015-01-03 ENCOUNTER — Telehealth: Payer: Self-pay | Admitting: *Deleted

## 2015-01-03 DIAGNOSIS — Z1231 Encounter for screening mammogram for malignant neoplasm of breast: Secondary | ICD-10-CM

## 2015-01-03 LAB — HIV-1 RNA QUANT-NO REFLEX-BLD: HIV-1 RNA Quant, Log: <1.3 {Log} (ref <1.30)

## 2015-01-03 LAB — TSH: TSH: 5.623 u[IU]/mL — AB (ref 0.350–4.500)

## 2015-01-03 LAB — URINE CYTOLOGY ANCILLARY ONLY
Chlamydia: NEGATIVE
Neisseria Gonorrhea: NEGATIVE

## 2015-01-03 LAB — T-HELPER CELL (CD4) - (RCID CLINIC ONLY)
CD4 % Helper T Cell: 51 % (ref 33–55)
CD4 T Cell Abs: 870 /uL (ref 400–2700)

## 2015-01-03 LAB — RPR

## 2015-01-03 NOTE — Telephone Encounter (Signed)
Called patient and had to leave a voice mail to return my call. Her Mammogram appt is scheduled for Monday 01/08/15 at 12:00 pm at the [facility] [place]. She also has an appt with Dr. Loanne Drilling for  01/12/15 at 3:00 pm. Myrtis Hopping

## 2015-01-04 NOTE — Telephone Encounter (Signed)
Patient did not return voice mail. Appointment information mailed to her home. [name]

## 2015-01-08 ENCOUNTER — Ambulatory Visit
Admission: RE | Admit: 2015-01-08 | Discharge: 2015-01-08 | Disposition: A | Discharge status: Home / Self Care | Payer: Medicaid Other | Source: Ambulatory Visit | Attending: Internal Medicine | Admitting: Internal Medicine

## 2015-01-08 ENCOUNTER — Other Ambulatory Visit: Payer: Self-pay

## 2015-01-08 DIAGNOSIS — Z1231 Encounter for screening mammogram for malignant neoplasm of breast: Secondary | ICD-10-CM

## 2015-01-08 MED ORDER — LEVOTHYROXINE SODIUM 150 MCG PO TABS: 150.0000 ug | ORAL_TABLET | Freq: Every day | ORAL | Status: DC

## 2015-01-12 ENCOUNTER — Ambulatory Visit: Payer: Medicaid Other

## 2015-01-12 ENCOUNTER — Ambulatory Visit: Payer: Medicaid Other | Admitting: Endocrinology

## 2015-01-15 ENCOUNTER — Telehealth: Payer: Self-pay | Admitting: Endocrinology

## 2015-01-15 NOTE — Telephone Encounter (Signed)
Patient no showed today's appt. Please advise on how to follow up. °A. No follow up necessary. °B. Follow up urgent. Contact patient immediately. °C. Follow up necessary. Contact patient and schedule visit in ___ days. °D. Follow up advised. Contact patient and schedule visit in ____weeks. ° °

## 2015-01-16 ENCOUNTER — Other Ambulatory Visit: Payer: Self-pay

## 2015-01-16 NOTE — Telephone Encounter (Signed)
Follow up advised. Contact patient and schedule visit in 2 weeks. 

## 2015-01-16 NOTE — Telephone Encounter (Signed)
Lvom advising pt to call back and reschedule appointment.  

## 2015-01-25 ENCOUNTER — Other Ambulatory Visit: Payer: Self-pay

## 2015-01-25 MED ORDER — LEVOTHYROXINE SODIUM 150 MCG PO TABS: 150.0000 ug | ORAL_TABLET | Freq: Every day | ORAL | Status: DC

## 2015-03-19 ENCOUNTER — Other Ambulatory Visit: Payer: Medicaid Other

## 2015-04-02 ENCOUNTER — Ambulatory Visit: Payer: Medicaid Other | Admitting: Infectious Disease

## 2015-04-03 ENCOUNTER — Ambulatory Visit: Payer: Medicaid Other | Admitting: Infectious Disease

## 2015-04-03 ENCOUNTER — Telehealth: Payer: Self-pay | Admitting: *Deleted

## 2015-04-04 NOTE — Telephone Encounter (Signed)
Unable to reach pt at listed phone numbers.

## 2015-04-23 ENCOUNTER — Ambulatory Visit: Payer: Medicaid Other | Admitting: Infectious Disease

## 2015-04-24 ENCOUNTER — Telehealth: Payer: Self-pay | Admitting: *Deleted

## 2015-04-24 NOTE — Telephone Encounter (Signed)
She is not to receive further sedating meds from use either

## 2015-04-24 NOTE — Telephone Encounter (Addendum)
Patient called, stating she "keeps sleeping through her appointments."  Patient was scheduled yesterday at 4:00.  She states she sleeps all day due to her new medication. RN asked what medication she is taking. Patient states her new PCP prescribed "something for the pain" and sometimes she doubles up on it.  She read 3 different medications that she often doubles - gabapentin, tramadol, trazodone (trazodone, tramadol not on her med list in EPIC).  RN advised patient that this is a dangerous practice, that she needs to speak to a physician about this.  Patient stated it's "not a problem because that is the only thing that takes the pain away."  RN advised patient that she needs to speak with the doctor if her pain is uncontrolled.  Patient did not want to discuss this any more, only wanted to reschedule her appointments. Appointments made.  Patient asked for this RN to make an appointment with Dr. Loanne Drilling, RN stated she needed to contact his office to schedule an appointment with him.  Patient given phone number.   RN updated patient's PCP in EPIC, asked if it was all right to contact the PCP on patient's behalf; patient agreed.  RN contacted Dr. Leontine Locket office, made the nurse aware of the patient's situation.  She will contact the patient for follow up. Landis Gandy, RN

## 2015-04-24 NOTE — Telephone Encounter (Signed)
i am not the prescriber of these meds. However, pt is due for thyroid f/u.

## 2015-04-25 NOTE — Telephone Encounter (Signed)
Patient has been scheduled for her follow up with Dr. Loanne Drilling on 05/08/2015 at 1:45.

## 2015-04-27 ENCOUNTER — Ambulatory Visit: Payer: Medicaid Other

## 2015-05-08 ENCOUNTER — Other Ambulatory Visit: Payer: Self-pay | Admitting: Licensed Clinical Social Worker

## 2015-05-08 ENCOUNTER — Ambulatory Visit: Payer: Medicaid Other | Admitting: Endocrinology

## 2015-05-08 MED ORDER — GABAPENTIN 300 MG PO CAPS: 300.0000 mg | ORAL_CAPSULE | Freq: Three times a day (TID) | ORAL | Status: DC

## 2015-05-25 ENCOUNTER — Ambulatory Visit: Payer: Medicaid Other

## 2015-06-05 ENCOUNTER — Encounter (HOSPITAL_COMMUNITY): Payer: Self-pay | Admitting: Emergency Medicine

## 2015-06-05 ENCOUNTER — Emergency Department (HOSPITAL_COMMUNITY)
Admission: EM | Admit: 2015-06-05 | Discharge: 2015-06-05 | Disposition: A | Discharge status: Home / Self Care | Payer: Medicaid Other | Attending: Emergency Medicine | Admitting: Emergency Medicine

## 2015-06-05 DIAGNOSIS — N898 Other specified noninflammatory disorders of vagina: Secondary | ICD-10-CM | POA: Diagnosis present

## 2015-06-05 DIAGNOSIS — M1990 Unspecified osteoarthritis, unspecified site: Secondary | ICD-10-CM | POA: Diagnosis not present

## 2015-06-05 DIAGNOSIS — Z86018 Personal history of other benign neoplasm: Secondary | ICD-10-CM | POA: Insufficient documentation

## 2015-06-05 DIAGNOSIS — Z720 Tobacco use: Secondary | ICD-10-CM | POA: Diagnosis not present

## 2015-06-05 DIAGNOSIS — Z9889 Other specified postprocedural states: Secondary | ICD-10-CM | POA: Insufficient documentation

## 2015-06-05 DIAGNOSIS — F419 Anxiety disorder, unspecified: Secondary | ICD-10-CM | POA: Diagnosis not present

## 2015-06-05 DIAGNOSIS — Z79899 Other long term (current) drug therapy: Secondary | ICD-10-CM | POA: Insufficient documentation

## 2015-06-05 DIAGNOSIS — A599 Trichomoniasis, unspecified: Secondary | ICD-10-CM

## 2015-06-05 DIAGNOSIS — J449 Chronic obstructive pulmonary disease, unspecified: Secondary | ICD-10-CM | POA: Diagnosis not present

## 2015-06-05 DIAGNOSIS — Z21 Asymptomatic human immunodeficiency virus [HIV] infection status: Secondary | ICD-10-CM | POA: Diagnosis not present

## 2015-06-05 DIAGNOSIS — Z8701 Personal history of pneumonia (recurrent): Secondary | ICD-10-CM | POA: Insufficient documentation

## 2015-06-05 DIAGNOSIS — F329 Major depressive disorder, single episode, unspecified: Secondary | ICD-10-CM | POA: Insufficient documentation

## 2015-06-05 DIAGNOSIS — N390 Urinary tract infection, site not specified: Secondary | ICD-10-CM | POA: Insufficient documentation

## 2015-06-05 DIAGNOSIS — A5901 Trichomonal vulvovaginitis: Secondary | ICD-10-CM | POA: Insufficient documentation

## 2015-06-05 DIAGNOSIS — Z7951 Long term (current) use of inhaled steroids: Secondary | ICD-10-CM | POA: Insufficient documentation

## 2015-06-05 DIAGNOSIS — Z85850 Personal history of malignant neoplasm of thyroid: Secondary | ICD-10-CM | POA: Insufficient documentation

## 2015-06-05 LAB — URINALYSIS, ROUTINE W REFLEX MICROSCOPIC
Glucose, UA: 100 mg/dL — AB
Ketones, ur: 15 mg/dL — AB
NITRITE: NEGATIVE
Protein, ur: 100 mg/dL — AB
SPECIFIC GRAVITY, URINE: 1.028 (ref 1.005–1.030)
UROBILINOGEN UA: 1 mg/dL (ref 0.0–1.0)
pH: 5 (ref 5.0–8.0)

## 2015-06-05 LAB — URINE MICROSCOPIC-ADD ON

## 2015-06-05 LAB — WET PREP, GENITAL
CLUE CELLS WET PREP: NONE SEEN
Yeast Wet Prep HPF POC: NONE SEEN

## 2015-06-05 MED ORDER — AZITHROMYCIN 1 G PO PACK
1.0000 g | PACK | Freq: Once | ORAL | Status: AC
  Administered 2015-06-05: 1 g via ORAL
  Filled 2015-06-05: qty 1

## 2015-06-05 MED ORDER — CEFTRIAXONE SODIUM 250 MG IJ SOLR
250.0000 mg | Freq: Once | INTRAMUSCULAR | Status: AC
  Administered 2015-06-05: 250 mg via INTRAMUSCULAR
  Filled 2015-06-05: qty 250

## 2015-06-05 MED ORDER — CEPHALEXIN 500 MG PO CAPS: 500.0000 mg | ORAL_CAPSULE | Freq: Two times a day (BID) | ORAL | Status: DC

## 2015-06-05 MED ORDER — METRONIDAZOLE 500 MG PO TABS: 500.0000 mg | ORAL_TABLET | Freq: Two times a day (BID) | ORAL | Status: DC

## 2015-06-05 MED ORDER — LIDOCAINE HCL (PF) 1 % IJ SOLN
2.0000 mL | Freq: Once | INTRAMUSCULAR | Status: AC
  Administered 2015-06-05: 2 mL
  Filled 2015-06-05: qty 5

## 2015-06-05 NOTE — ED Notes (Signed)
Pt. Stated, Ive had vaginal discharge for 4 days

## 2015-06-05 NOTE — ED Provider Notes (Signed)
CSN: [id number]     Arrival date & time 06/05/15  0630 History   First MD Initiated Contact with Patient 06/05/15 1010     Chief Complaint  Patient presents with  . Vaginal Discharge     (Consider location/radiation/quality/duration/timing/severity/associated sxs/prior Treatment) HPI Comments: Pt comes in with the c/o vaginal discharge for 4 days she states that she tried monistat without relief. Does have urinary frequency. No abdominal pain or fever. She has had the symptoms before but yeast medication normally works  The history is provided by the patient. No language interpreter was used.    Past Medical History  Diagnosis Date  . Arthritis   . COPD (chronic obstructive pulmonary disease)   . Lipoma     right forearm  . Headache(784.0)   . Anxiety   . Depression   . HIV (human immunodeficiency virus infection)     "dx'd ~ 12/2011"  . Chronic bronchitis   . Pneumonia ~ 2000's    "once"  . Shortness of breath on exertion   . 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 Fibromyalgia     "I think I got that"  . Thyroid cancer s/p total thyroidectomy April2013 02/06/2012    Per patient report.  Recently diagnosed. Awaiting procedure to have thyroid removed.  Under treatment with Dr Janace Hoard .   Marland Kitchen Asthma    Past Surgical History  Procedure Laterality Date  . Thyroidectomy  03/11/12    bilaterally  . Anterior cervical decomp/discectomy fusion  05/2009    C3-4; C5-6  . Dilation and curettage of uterus    . Thyroidectomy  03/11/2012    Procedure: THYROIDECTOMY;  Surgeon: Melissa Montane, MD;  Location: [facility] OR;  Service: ENT;  Laterality: Bilateral;   Family History  Problem Relation Age of Onset  . Heart disease Maternal Uncle    History  Substance Use Topics  . Smoking status: Current Every Day Smoker -- 1.00 packs/day for 47 years    Types: Cigarettes  . Smokeless tobacco: Former Systems developer    Quit date: 12/01/1974     Comment: currently smokes   . Alcohol Use: No     Comment: 03/11/12  "no beer or wine ~ 01/2012"   OB History    No data available     Review of Systems  All other systems reviewed and are negative.     Allergies  Sulfa antibiotics  Home Medications   Prior to Admission medications   Medication Sig Start Date End Date Taking? Authorizing Provider  albuterol (PROVENTIL HFA;VENTOLIN HFA) 108 (90 BASE) MCG/ACT inhaler Inhale 2 puffs into the lungs every 4 (four) hours as needed for wheezing or shortness of breath. Patient not taking: Reported on 01/02/2015 08/10/14   Delice Bison Ward, DO  atazanavir (REYATAZ) 300 MG capsule Take 1 capsule (300 mg total) by mouth daily with breakfast. 05/15/14   Truman Hayward, MD  citalopram (CELEXA) 20 MG tablet Take 1 tablet (20 mg total) by mouth daily. 05/15/14 05/15/15  Truman Hayward, MD  emtricitabine-tenofovir (TRUVADA) 200-300 MG per tablet Take 1 tablet by mouth daily. 05/15/14   Truman Hayward, MD  ENSURE (ENSURE) Take 237 mLs by mouth 3 (three) times daily between meals. Patient not taking: Reported on 01/02/2015 07/17/14   Truman Hayward, MD  gabapentin (NEURONTIN) 300 MG capsule Take 1 capsule (300 mg total) by mouth 3 (three) times daily. 05/08/15   Truman Hayward, MD  hydrOXYzine (ATARAX/VISTARIL) 25  MG tablet Take 1 tablet (25 mg total) by mouth every 8 (eight) hours as needed for itching. 04/27/14   Truman Hayward, MD  ibuprofen (ADVIL,MOTRIN) 200 MG tablet Take 400 mg by mouth every 6 (six) hours as needed for mild pain or moderate pain.    Historical Provider, MD  levothyroxine (SYNTHROID, LEVOTHROID) 150 MCG tablet Take 1 tablet (150 mcg total) by mouth daily. 01/25/15   Renato Shin, MD  megestrol (MEGACE ES) 625 MG/5ML suspension Take 5 mLs (625 mg total) by mouth daily. Patient not taking: Reported on 01/02/2015 06/12/14   Truman Hayward, MD  ritonavir (NORVIR) 100 MG TABS tablet Take 1 tablet (100 mg total) by mouth daily. 11/14/14   Truman Hayward, MD   BP 90/62 mmHg  Pulse  110  Temp(Src) 97.5 F (36.4 C)  Resp 17  Ht 5' 8.5" (1.74 m)  Wt 139 lb 5 oz (63.192 kg)  BMI 20.87 kg/m2  SpO2 97% Physical Exam  Constitutional: She is oriented to person, place, and time. She appears well-developed and well-nourished.  Cardiovascular: Normal rate and regular rhythm.   Pulmonary/Chest: Effort normal and breath sounds normal.  Abdominal: Soft. Bowel sounds are normal. There is no tenderness.  Genitourinary:  Yellow discharge. No cmt  Musculoskeletal: Normal range of motion.  Neurological: She is alert and oriented to person, place, and time.  Skin: Skin is warm and dry.  Nursing note and vitals reviewed.   ED Course  Procedures (including critical care time) Labs Review Labs Reviewed  WET PREP, GENITAL - Abnormal; Notable for the following:    Trich, Wet Prep FEW (*)    WBC, Wet Prep HPF POC TOO NUMEROUS TO COUNT (*)    All other components within normal limits  URINALYSIS, ROUTINE W REFLEX MICROSCOPIC (NOT AT [facility]) - Abnormal; Notable for the following:    Color, Urine AMBER (*)    APPearance CLOUDY (*)    Glucose, UA 100 (*)    Hgb urine dipstick MODERATE (*)    Bilirubin Urine LARGE (*)    Ketones, ur 15 (*)    Protein, ur 100 (*)    Leukocytes, UA LARGE (*)    All other components within normal limits  URINE MICROSCOPIC-ADD ON - Abnormal; Notable for the following:    Squamous Epithelial / LPF MANY (*)    Bacteria, UA FEW (*)    Casts GRANULAR CAST (*)    Crystals CA OXALATE CRYSTALS (*)    All other components within normal limits  URINE CULTURE  HIV ANTIBODY (ROUTINE TESTING)  GC/CHLAMYDIA PROBE AMP ([place]) NOT AT [facility]    Imaging Review No results found.   EKG Interpretation None      MDM   Final diagnoses:  Trichomoniasis  UTI (lower urinary tract infection)   Urine culture sent. Has uti and trich. She was given rocephin and zithromax here and will send home with flagyl and keflex. Discussed return precautions with  pt    Glendell Docker, NP 06/05/15 1324  Davonna Belling, MD 06/07/15 [phone]

## 2015-06-05 NOTE — Discharge Instructions (Signed)
Trichomoniasis °Trichomoniasis is an infection caused by an organism called Trichomonas. The infection can affect both women and men. In women, the outer female genitalia and the vagina are affected. In men, the penis is mainly affected, but the prostate and other reproductive organs can also be involved. Trichomoniasis is a sexually transmitted infection (STI) and is most often passed to another person through sexual contact.  °RISK FACTORS °· Having unprotected sexual intercourse. °· Having sexual intercourse with an infected partner. °SIGNS AND SYMPTOMS  °Symptoms of trichomoniasis in women include: °· Abnormal gray-green frothy vaginal discharge. °· Itching and irritation of the vagina. °· Itching and irritation of the area outside the vagina. °Symptoms of trichomoniasis in men include:  °· Penile discharge with or without pain. °· Pain during urination. This results from inflammation of the urethra. °DIAGNOSIS  °Trichomoniasis may be found during a Pap test or physical exam. Your health care provider may use one of the following methods to help diagnose this infection: °· Examining vaginal discharge under a microscope. For men, urethral discharge would be examined. °· Testing the pH of the vagina with a test tape. °· Using a vaginal swab test that checks for the Trichomonas organism. A test is available that provides results within a few minutes. °· Doing a culture test for the organism. This is not usually needed. °TREATMENT  °· You may be given medicine to fight the infection. Women should inform their health care provider if they could be or are pregnant. Some medicines used to treat the infection should not be taken during pregnancy. °· Your health care provider may recommend over-the-counter medicines or creams to decrease itching or irritation. °· Your sexual partner will need to be treated if infected. °HOME CARE INSTRUCTIONS  °· Take medicines only as directed by your health care provider. °· Take  over-the-counter medicine for itching or irritation as directed by your health care provider. °· Do not have sexual intercourse while you have the infection. °· Women should not douche or wear tampons while they have the infection. °· Discuss your infection with your partner. Your partner may have gotten the infection from you, or you may have gotten it from your partner. °· Have your sex partner get examined and treated if necessary. °· Practice safe, informed, and protected sex. °· See your health care provider for other STI testing. °SEEK MEDICAL CARE IF:  °· You still have symptoms after you finish your medicine. °· You develop abdominal pain. °· You have pain when you urinate. °· You have bleeding after sexual intercourse. °· You develop a rash. °· Your medicine makes you sick or makes you throw up (vomit). °MAKE SURE YOU: °· Understand these instructions. °· Will watch your condition. °· Will get help right away if you are not doing well or get worse. °Document Released: 05/13/2001 Document Revised: 04/03/2014 Document Reviewed: 08/29/2013 °ExitCare® Patient Information ©2015 [employer]. This information is not intended to replace advice given to you by your health care provider. Make sure you discuss any questions you have with your health care provider. ° °Urinary Tract Infection °Urinary tract infections (UTIs) can develop anywhere along your urinary tract. Your urinary tract is your body's drainage system for removing wastes and extra water. Your urinary tract includes two kidneys, two ureters, a bladder, and a urethra. Your kidneys are a pair of bean-shaped organs. Each kidney is about the size of your fist. They are located below your ribs, one on each side of your spine. °CAUSES °Infections   are caused by microbes, which are microscopic organisms, including fungi, viruses, and bacteria. These organisms are so small that they can only be seen through a microscope. Bacteria are the microbes that most  commonly cause UTIs. °SYMPTOMS  °Symptoms of UTIs may vary by age and gender of the patient and by the location of the infection. Symptoms in young women typically include a frequent and intense urge to urinate and a painful, burning feeling in the bladder or urethra during urination. Older women and men are more likely to be tired, shaky, and weak and have muscle aches and abdominal pain. A fever may mean the infection is in your kidneys. Other symptoms of a kidney infection include pain in your back or sides below the ribs, nausea, and vomiting. °DIAGNOSIS °To diagnose a UTI, your caregiver will ask you about your symptoms. Your caregiver also will ask to provide a urine sample. The urine sample will be tested for bacteria and white blood cells. White blood cells are made by your body to help fight infection. °TREATMENT  °Typically, UTIs can be treated with medication. Because most UTIs are caused by a bacterial infection, they usually can be treated with the use of antibiotics. The choice of antibiotic and length of treatment depend on your symptoms and the type of bacteria causing your infection. °HOME CARE INSTRUCTIONS °· If you were prescribed antibiotics, take them exactly as your caregiver instructs you. Finish the medication even if you feel better after you have only taken some of the medication. °· Drink enough water and fluids to keep your urine clear or pale yellow. °· Avoid caffeine, tea, and carbonated beverages. They tend to irritate your bladder. °· Empty your bladder often. Avoid holding urine for long periods of time. °· Empty your bladder before and after sexual intercourse. °· After a bowel movement, women should cleanse from front to back. Use each tissue only once. °SEEK MEDICAL CARE IF:  °· You have back pain. °· You develop a fever. °· Your symptoms do not begin to resolve within 3 days. °SEEK IMMEDIATE MEDICAL CARE IF:  °· You have severe back pain or lower abdominal pain. °· You develop  chills. °· You have nausea or vomiting. °· You have continued burning or discomfort with urination. °MAKE SURE YOU:  °· Understand these instructions. °· Will watch your condition. °· Will get help right away if you are not doing well or get worse. °Document Released: 08/27/2005 Document Revised: 05/18/2012 Document Reviewed: 12/26/2011 °ExitCare® Patient Information ©2015 [employer]. This information is not intended to replace advice given to you by your health care provider. Make sure you discuss any questions you have with your health care provider. ° °

## 2015-06-05 NOTE — ED Notes (Signed)
Pt refusing to have blood drawn.

## 2015-06-06 ENCOUNTER — Ambulatory Visit: Payer: Medicaid Other | Admitting: Infectious Disease

## 2015-06-06 LAB — URINE CULTURE: Culture: NO GROWTH

## 2015-06-06 LAB — GC/CHLAMYDIA PROBE AMP (~~LOC~~) NOT AT ARMC
Chlamydia: NEGATIVE
Neisseria Gonorrhea: NEGATIVE

## 2015-06-07 LAB — HIV ANTIBODY (ROUTINE TESTING W REFLEX): HIV Screen 4th Generation wRfx: REACTIVE — AB

## 2015-06-07 LAB — HIV 1/2 AB DIFFERENTIATION
HIV 1 AB: POSITIVE — AB
HIV 2 AB: NEGATIVE

## 2015-06-08 ENCOUNTER — Ambulatory Visit: Payer: Medicaid Other | Admitting: Endocrinology

## 2015-06-08 ENCOUNTER — Telehealth: Payer: Self-pay | Admitting: Endocrinology

## 2015-06-08 NOTE — Telephone Encounter (Signed)
Patient no showed today's appt. Please advise on how to follow up. °A. No follow up necessary. °B. Follow up urgent. Contact patient immediately. °C. Follow up necessary. Contact patient and schedule visit in ___ days. °D. Follow up advised. Contact patient and schedule visit in ____weeks. ° °

## 2015-06-08 NOTE — Telephone Encounter (Signed)
Follow up advised. Contact patient and schedule visit in 3 months. 

## 2015-06-08 NOTE — Telephone Encounter (Signed)
Pt did make an appt

## 2015-06-11 ENCOUNTER — Other Ambulatory Visit: Payer: Self-pay | Admitting: *Deleted

## 2015-06-11 DIAGNOSIS — B20 Human immunodeficiency virus [HIV] disease: Secondary | ICD-10-CM

## 2015-06-11 MED ORDER — EMTRICITABINE-TENOFOVIR DF 200-300 MG PO TABS: 1.0000 | ORAL_TABLET | Freq: Every day | ORAL | Status: DC

## 2015-06-11 MED ORDER — RITONAVIR 100 MG PO TABS: 100.0000 mg | ORAL_TABLET | Freq: Every day | ORAL | Status: DC

## 2015-06-11 MED ORDER — ATAZANAVIR SULFATE 300 MG PO CAPS: 300.0000 mg | ORAL_CAPSULE | Freq: Every day | ORAL | Status: DC

## 2015-06-11 NOTE — Telephone Encounter (Signed)
No show letter mailed to the pt.  

## 2015-06-13 ENCOUNTER — Other Ambulatory Visit: Payer: Self-pay | Admitting: *Deleted

## 2015-06-13 DIAGNOSIS — B20 Human immunodeficiency virus [HIV] disease: Secondary | ICD-10-CM

## 2015-06-13 DIAGNOSIS — F329 Major depressive disorder, single episode, unspecified: Secondary | ICD-10-CM

## 2015-06-13 DIAGNOSIS — F330 Major depressive disorder, recurrent, mild: Secondary | ICD-10-CM

## 2015-06-13 DIAGNOSIS — F32A Depression, unspecified: Secondary | ICD-10-CM

## 2015-06-13 MED ORDER — CITALOPRAM HYDROBROMIDE 20 MG PO TABS: 20.0000 mg | ORAL_TABLET | Freq: Every day | ORAL | Status: DC

## 2015-06-13 MED ORDER — ATAZANAVIR SULFATE 300 MG PO CAPS: 300.0000 mg | ORAL_CAPSULE | Freq: Every day | ORAL | Status: DC

## 2015-06-13 MED ORDER — EMTRICITABINE-TENOFOVIR DF 200-300 MG PO TABS: 1.0000 | ORAL_TABLET | Freq: Every day | ORAL | Status: DC

## 2015-06-22 ENCOUNTER — Ambulatory Visit: Payer: Medicaid Other | Admitting: Endocrinology

## 2015-06-28 ENCOUNTER — Other Ambulatory Visit: Payer: Self-pay | Admitting: Licensed Clinical Social Worker

## 2015-06-28 DIAGNOSIS — B20 Human immunodeficiency virus [HIV] disease: Secondary | ICD-10-CM

## 2015-06-28 DIAGNOSIS — F32A Depression, unspecified: Secondary | ICD-10-CM

## 2015-06-28 DIAGNOSIS — F330 Major depressive disorder, recurrent, mild: Secondary | ICD-10-CM

## 2015-06-28 DIAGNOSIS — F329 Major depressive disorder, single episode, unspecified: Secondary | ICD-10-CM

## 2015-06-28 MED ORDER — CITALOPRAM HYDROBROMIDE 20 MG PO TABS: 20.0000 mg | ORAL_TABLET | Freq: Every day | ORAL | Status: DC

## 2015-06-28 MED ORDER — EMTRICITABINE-TENOFOVIR DF 200-300 MG PO TABS: 1.0000 | ORAL_TABLET | Freq: Every day | ORAL | Status: DC

## 2015-06-28 MED ORDER — ATAZANAVIR SULFATE 300 MG PO CAPS: 300.0000 mg | ORAL_CAPSULE | Freq: Every day | ORAL | Status: DC

## 2015-06-28 MED ORDER — RITONAVIR 100 MG PO TABS: 100.0000 mg | ORAL_TABLET | Freq: Every day | ORAL | Status: DC

## 2015-07-20 ENCOUNTER — Ambulatory Visit: Payer: Medicaid Other

## 2015-08-13 ENCOUNTER — Other Ambulatory Visit: Payer: Self-pay | Admitting: *Deleted

## 2015-08-13 DIAGNOSIS — B20 Human immunodeficiency virus [HIV] disease: Secondary | ICD-10-CM

## 2015-08-13 DIAGNOSIS — F32A Depression, unspecified: Secondary | ICD-10-CM

## 2015-08-13 DIAGNOSIS — F330 Major depressive disorder, recurrent, mild: Secondary | ICD-10-CM

## 2015-08-13 DIAGNOSIS — F329 Major depressive disorder, single episode, unspecified: Secondary | ICD-10-CM

## 2015-08-13 MED ORDER — ATAZANAVIR SULFATE 300 MG PO CAPS: 300.0000 mg | ORAL_CAPSULE | Freq: Every day | ORAL | Status: DC

## 2015-08-13 MED ORDER — CITALOPRAM HYDROBROMIDE 20 MG PO TABS: 20.0000 mg | ORAL_TABLET | Freq: Every day | ORAL | Status: DC

## 2015-08-13 MED ORDER — RITONAVIR 100 MG PO TABS: 100.0000 mg | ORAL_TABLET | Freq: Every day | ORAL | Status: DC

## 2015-08-13 MED ORDER — EMTRICITABINE-TENOFOVIR DF 200-300 MG PO TABS: 1.0000 | ORAL_TABLET | Freq: Every day | ORAL | Status: DC

## 2015-08-13 NOTE — Telephone Encounter (Signed)
Needs to make and keep appointment with Dr. Conley Canal for more refills ASAP.  Thank you.

## 2015-09-18 ENCOUNTER — Other Ambulatory Visit: Payer: Self-pay | Admitting: *Deleted

## 2015-09-18 DIAGNOSIS — B20 Human immunodeficiency virus [HIV] disease: Secondary | ICD-10-CM

## 2015-09-18 MED ORDER — EMTRICITABINE-TENOFOVIR DF 200-300 MG PO TABS: 1.0000 | ORAL_TABLET | Freq: Every day | ORAL | Status: DC

## 2015-09-18 NOTE — Progress Notes (Signed)
Pharmacy requesting refill for gabapentin and truvada.  Patient has 30 more days of norvir and reyataz, only needs truvada.  Patient is overdue for visit, has no-showed multiple visits.  Previous refills have noted that she must be seen for additional refills.  RN authorized 1 month of truvada to fulfill her regimen, but not gabapentin at this time with the understanding that she needs to be seen for refills. Pharmacy will relay message to patient. Landis Gandy, RN

## 2015-09-24 ENCOUNTER — Other Ambulatory Visit: Payer: Medicaid Other

## 2015-09-24 ENCOUNTER — Telehealth: Payer: Self-pay | Admitting: *Deleted

## 2015-09-24 DIAGNOSIS — B20 Human immunodeficiency virus [HIV] disease: Secondary | ICD-10-CM

## 2015-09-24 LAB — COMPLETE METABOLIC PANEL WITH GFR
ALT: 15 U/L (ref 6–29)
AST: 16 U/L (ref 10–35)
Albumin: 4 g/dL (ref 3.6–5.1)
Alkaline Phosphatase: 56 U/L (ref 33–130)
BILIRUBIN TOTAL: 0.8 mg/dL (ref 0.2–1.2)
BUN: 18 mg/dL (ref 7–25)
CALCIUM: 9.4 mg/dL (ref 8.6–10.4)
CHLORIDE: 107 mmol/L (ref 98–110)
CO2: 24 mmol/L (ref 20–31)
CREATININE: 1.28 mg/dL — AB (ref 0.50–0.99)
GFR, Est African American: 51 mL/min — ABNORMAL LOW (ref >=60)
GFR, Est Non African American: 45 mL/min — ABNORMAL LOW (ref >=60)
Glucose, Bld: 104 mg/dL — ABNORMAL HIGH (ref 65–99)
Potassium: 4 mmol/L (ref 3.5–5.3)
Sodium: 141 mmol/L (ref 135–146)
TOTAL PROTEIN: 6.7 g/dL (ref 6.1–8.1)

## 2015-09-24 LAB — CBC WITH DIFFERENTIAL/PLATELET
BASOS ABS: 0.1 10*3/uL (ref 0.0–0.1)
Basophils Relative: 1 % (ref 0–1)
EOS PCT: 1 % (ref 0–5)
Eosinophils Absolute: 0.1 10*3/uL (ref 0.0–0.7)
HEMATOCRIT: 40.4 % (ref 36.0–46.0)
Hemoglobin: 13.3 g/dL (ref 12.0–15.0)
LYMPHS ABS: 1.9 10*3/uL (ref 0.7–4.0)
LYMPHS PCT: 35 % (ref 12–46)
MCH: 32.3 pg (ref 26.0–34.0)
MCHC: 32.9 g/dL (ref 30.0–36.0)
MCV: 98.1 fL (ref 78.0–100.0)
MPV: 9.2 fL (ref 8.6–12.4)
Monocytes Absolute: 0.5 10*3/uL (ref 0.1–1.0)
Monocytes Relative: 9 % (ref 3–12)
NEUTROS ABS: 2.9 10*3/uL (ref 1.7–7.7)
NEUTROS PCT: 54 % (ref 43–77)
Platelets: 318 10*3/uL (ref 150–400)
RBC: 4.12 MIL/uL (ref 3.87–5.11)
RDW: 13.7 % (ref 11.5–15.5)
WBC: 5.4 10*3/uL (ref 4.0–10.5)

## 2015-09-24 LAB — LIPID PANEL
CHOLESTEROL: 161 mg/dL (ref 125–200)
HDL: 38 mg/dL — ABNORMAL LOW (ref >=46)
LDL CALC: 90 mg/dL (ref <130)
Total CHOL/HDL Ratio: 4.2 Ratio (ref <=5.0)
Triglycerides: 164 mg/dL — ABNORMAL HIGH (ref <150)
VLDL: 33 mg/dL — AB (ref <30)

## 2015-09-24 NOTE — Addendum Note (Signed)
Addended by: Dolan Amen D on: 09/24/2015 03:22 PM   Modules accepted: Orders

## 2015-09-24 NOTE — Telephone Encounter (Signed)
Walk-in to [place], vaginal discharge.  Pt has had multiple "No Show" appts.  Obtained appropriate labwork.  Pt given Dr. Lucianne Lei Dam's appointment dates to return for a "Walk-In" appointment for follow-up of labwork.  Pt complained of whitish, watery, vaginal discharge x 1 week.  Very little odor to the discharge, no pain.  Some mild mid-abdominal discomfort/pain.  Pt requesting treatment for vaginal discharge.  RN advised pt to return to Center when Dr. Tommy Medal is seeing patients for a "Walk-In" appointment.  Pt stated that she would arrange Medicaid transportation for the return to Center.

## 2015-09-24 NOTE — Telephone Encounter (Signed)
Ok very good.

## 2015-09-25 ENCOUNTER — Other Ambulatory Visit: Payer: Self-pay | Admitting: *Deleted

## 2015-09-25 LAB — RPR

## 2015-09-25 LAB — HIV-1 RNA QUANT-NO REFLEX-BLD

## 2015-09-25 MED ORDER — GABAPENTIN 300 MG PO CAPS: 300.0000 mg | ORAL_CAPSULE | Freq: Three times a day (TID) | ORAL | Status: DC

## 2015-09-27 LAB — T-HELPER CELL (CD4) - (RCID CLINIC ONLY)
CD4 % Helper T Cell: 45 % (ref 33–55)
CD4 T Cell Abs: 820 /uL (ref 400–2700)

## 2015-10-03 ENCOUNTER — Ambulatory Visit: Payer: Medicaid Other | Admitting: Endocrinology

## 2015-10-03 DIAGNOSIS — Z0289 Encounter for other administrative examinations: Secondary | ICD-10-CM

## 2015-10-17 ENCOUNTER — Other Ambulatory Visit: Payer: Self-pay | Admitting: *Deleted

## 2015-10-17 DIAGNOSIS — F32A Depression, unspecified: Secondary | ICD-10-CM

## 2015-10-17 DIAGNOSIS — F330 Major depressive disorder, recurrent, mild: Secondary | ICD-10-CM

## 2015-10-17 DIAGNOSIS — F329 Major depressive disorder, single episode, unspecified: Secondary | ICD-10-CM

## 2015-10-17 DIAGNOSIS — B20 Human immunodeficiency virus [HIV] disease: Secondary | ICD-10-CM

## 2015-10-17 MED ORDER — EMTRICITABINE-TENOFOVIR DF 200-300 MG PO TABS: 1.0000 | ORAL_TABLET | Freq: Every day | ORAL | Status: DC

## 2015-10-17 MED ORDER — CITALOPRAM HYDROBROMIDE 20 MG PO TABS: 20.0000 mg | ORAL_TABLET | Freq: Every day | ORAL | Status: DC

## 2015-10-17 MED ORDER — ATAZANAVIR SULFATE 300 MG PO CAPS: 300.0000 mg | ORAL_CAPSULE | Freq: Every day | ORAL | Status: DC

## 2015-10-17 MED ORDER — RITONAVIR 100 MG PO TABS: 100.0000 mg | ORAL_TABLET | Freq: Every day | ORAL | Status: DC

## 2015-10-24 ENCOUNTER — Other Ambulatory Visit: Payer: Self-pay | Admitting: *Deleted

## 2015-10-24 NOTE — Telephone Encounter (Signed)
Error refills sent 10/07/15

## 2015-10-31 ENCOUNTER — Telehealth: Payer: Self-pay | Admitting: Infectious Diseases

## 2015-10-31 NOTE — Telephone Encounter (Signed)
Contacted pharmacy, gave verbal for Truvada to match the Norvir and Reyataz in her regimen.  Patient has been told many times that she needs to be seen for refills.  Gabapentin denied, asked pharmacy to notify patient that we will refill it if she contacts [place]. Landis Gandy, RN

## 2015-10-31 NOTE — Telephone Encounter (Signed)
Nikki left a vm on the medication assistance line on Tuesday at 2:45 pm. Is requesting a refill on patient's gabapentin and truvada.

## 2015-11-20 ENCOUNTER — Telehealth: Payer: Self-pay | Admitting: Endocrinology

## 2015-11-20 MED ORDER — LEVOTHYROXINE SODIUM 150 MCG PO TABS: 150.0000 ug | ORAL_TABLET | Freq: Every day | ORAL | Status: DC

## 2015-11-20 NOTE — Telephone Encounter (Signed)
Requested a contacted back from the pt to discuss.

## 2015-11-20 NOTE — Telephone Encounter (Signed)
Please refill x 1 Ov is due  

## 2015-11-20 NOTE — Telephone Encounter (Signed)
Please advise if ok to refill rx. Last office visit was 08/16/2013. Thanks!

## 2015-11-20 NOTE — Telephone Encounter (Signed)
Pt needs thyroid med and muscle relaxer [facility]

## 2015-11-21 NOTE — Telephone Encounter (Signed)
I contacted the pt and advised we have refilled her thyroid medication for 30 days and that an appointment would needed for further refills. I also advised the pt would need to request the muscle relaxer from her pcp.

## 2015-12-18 ENCOUNTER — Emergency Department (HOSPITAL_COMMUNITY)
Admission: EM | Admit: 2015-12-18 | Discharge: 2015-12-18 | Discharge status: Against Medical Advice | Payer: Medicaid Other | Attending: Emergency Medicine | Admitting: Emergency Medicine

## 2015-12-18 ENCOUNTER — Telehealth: Payer: Self-pay | Admitting: *Deleted

## 2015-12-18 ENCOUNTER — Other Ambulatory Visit: Payer: Self-pay | Admitting: Infectious Disease

## 2015-12-18 ENCOUNTER — Encounter (HOSPITAL_COMMUNITY): Payer: Self-pay

## 2015-12-18 ENCOUNTER — Other Ambulatory Visit: Payer: Medicaid Other

## 2015-12-18 DIAGNOSIS — J449 Chronic obstructive pulmonary disease, unspecified: Secondary | ICD-10-CM | POA: Diagnosis not present

## 2015-12-18 DIAGNOSIS — F17210 Nicotine dependence, cigarettes, uncomplicated: Secondary | ICD-10-CM | POA: Diagnosis not present

## 2015-12-18 DIAGNOSIS — B20 Human immunodeficiency virus [HIV] disease: Secondary | ICD-10-CM | POA: Diagnosis not present

## 2015-12-18 DIAGNOSIS — M79602 Pain in left arm: Secondary | ICD-10-CM | POA: Diagnosis not present

## 2015-12-18 DIAGNOSIS — N898 Other specified noninflammatory disorders of vagina: Secondary | ICD-10-CM | POA: Diagnosis present

## 2015-12-18 DIAGNOSIS — Z113 Encounter for screening for infections with a predominantly sexual mode of transmission: Secondary | ICD-10-CM

## 2015-12-18 DIAGNOSIS — Z21 Asymptomatic human immunodeficiency virus [HIV] infection status: Secondary | ICD-10-CM

## 2015-12-18 NOTE — Telephone Encounter (Signed)
Patient walked into clinic requesting an appointment with Dr. Tommy Medal. She is c/o vaginal discharge, fatigue and tingling in her hands. Advised her he does not have anything available today, but can see her tomorrow at 1:30 pm. She said she may just go to the ED, but she is going to get her labs done today. She said she will come back to see Dr. Tommy Medal today and was given two bus passes so she will have a way to get here. Patient has had multiple no shows. Myrtis Hopping

## 2015-12-18 NOTE — ED Notes (Signed)
Pt states she is having vaginal discharge x 2 weeks, recurrent problem. Pt also reports her left arm is "burning" and has been hurting her for 2 weeks, "it feels like I stuck it on a stove" but denies actual injury. CP yesterday but none today.

## 2015-12-18 NOTE — ED Notes (Signed)
Pt upset about the wait time for the waiting room and states she is leaving and will follow up with her doctors tomorrow. Risks of leaving AMA discussed with patient and pt states she is still leaving. Pt observed leaving ED with steady gait, NAD.

## 2015-12-19 ENCOUNTER — Encounter: Payer: Self-pay | Admitting: Infectious Disease

## 2015-12-19 ENCOUNTER — Ambulatory Visit: Payer: Medicaid Other | Admitting: *Deleted

## 2015-12-19 ENCOUNTER — Ambulatory Visit (INDEPENDENT_AMBULATORY_CARE_PROVIDER_SITE_OTHER): Payer: Medicaid Other | Admitting: Infectious Disease

## 2015-12-19 ENCOUNTER — Other Ambulatory Visit: Payer: Self-pay | Admitting: *Deleted

## 2015-12-19 VITALS — BP 108/72 | HR 71 | Temp 98.4°F | Wt 137.0 lb

## 2015-12-19 DIAGNOSIS — F329 Major depressive disorder, single episode, unspecified: Secondary | ICD-10-CM | POA: Diagnosis not present

## 2015-12-19 DIAGNOSIS — E890 Postprocedural hypothyroidism: Secondary | ICD-10-CM | POA: Diagnosis not present

## 2015-12-19 DIAGNOSIS — Z21 Asymptomatic human immunodeficiency virus [HIV] infection status: Secondary | ICD-10-CM | POA: Diagnosis present

## 2015-12-19 DIAGNOSIS — N898 Other specified noninflammatory disorders of vagina: Secondary | ICD-10-CM

## 2015-12-19 DIAGNOSIS — B20 Human immunodeficiency virus [HIV] disease: Secondary | ICD-10-CM

## 2015-12-19 DIAGNOSIS — F411 Generalized anxiety disorder: Secondary | ICD-10-CM | POA: Diagnosis not present

## 2015-12-19 DIAGNOSIS — F330 Major depressive disorder, recurrent, mild: Secondary | ICD-10-CM

## 2015-12-19 DIAGNOSIS — Z113 Encounter for screening for infections with a predominantly sexual mode of transmission: Secondary | ICD-10-CM

## 2015-12-19 DIAGNOSIS — R630 Anorexia: Secondary | ICD-10-CM

## 2015-12-19 DIAGNOSIS — F32A Depression, unspecified: Secondary | ICD-10-CM

## 2015-12-19 DIAGNOSIS — G63 Polyneuropathy in diseases classified elsewhere: Secondary | ICD-10-CM

## 2015-12-19 DIAGNOSIS — Z23 Encounter for immunization: Secondary | ICD-10-CM

## 2015-12-19 HISTORY — DX: Other specified noninflammatory disorders of vagina: N89.8

## 2015-12-19 HISTORY — DX: Polyneuropathy in diseases classified elsewhere: B20

## 2015-12-19 LAB — CBC WITH DIFFERENTIAL/PLATELET
BASOS ABS: 0.1 10*3/uL (ref 0.0–0.1)
Basophils Relative: 1 % (ref 0–1)
Eosinophils Absolute: 0.1 10*3/uL (ref 0.0–0.7)
Eosinophils Relative: 2 % (ref 0–5)
HEMATOCRIT: 38.5 % (ref 36.0–46.0)
HEMOGLOBIN: 12.7 g/dL (ref 12.0–15.0)
LYMPHS ABS: 1.6 10*3/uL (ref 0.7–4.0)
LYMPHS PCT: 32 % (ref 12–46)
MCH: 32.1 pg (ref 26.0–34.0)
MCHC: 33 g/dL (ref 30.0–36.0)
MCV: 97.2 fL (ref 78.0–100.0)
MPV: 9.1 fL (ref 8.6–12.4)
Monocytes Absolute: 0.3 10*3/uL (ref 0.1–1.0)
Monocytes Relative: 6 % (ref 3–12)
NEUTROS ABS: 3 10*3/uL (ref 1.7–7.7)
NEUTROS PCT: 59 % (ref 43–77)
Platelets: 251 10*3/uL (ref 150–400)
RBC: 3.96 MIL/uL (ref 3.87–5.11)
RDW: 13.8 % (ref 11.5–15.5)
WBC: 5.1 10*3/uL (ref 4.0–10.5)

## 2015-12-19 LAB — COMPREHENSIVE METABOLIC PANEL
ALBUMIN: 3.8 g/dL (ref 3.6–5.1)
ALT: 20 U/L (ref 6–29)
AST: 23 U/L (ref 10–35)
Alkaline Phosphatase: 47 U/L (ref 33–130)
BUN: 16 mg/dL (ref 7–25)
CALCIUM: 9 mg/dL (ref 8.6–10.4)
CHLORIDE: 110 mmol/L (ref 98–110)
CO2: 27 mmol/L (ref 20–31)
CREATININE: 1.52 mg/dL — AB (ref 0.50–0.99)
Glucose, Bld: 74 mg/dL (ref 65–99)
POTASSIUM: 4.1 mmol/L (ref 3.5–5.3)
SODIUM: 142 mmol/L (ref 135–146)
Total Bilirubin: 0.8 mg/dL (ref 0.2–1.2)
Total Protein: 6.3 g/dL (ref 6.1–8.1)

## 2015-12-19 MED ORDER — CITALOPRAM HYDROBROMIDE 20 MG PO TABS: 20.0000 mg | ORAL_TABLET | Freq: Every day | ORAL | Status: DC

## 2015-12-19 MED ORDER — ATAZANAVIR SULFATE 300 MG PO CAPS: 300.0000 mg | ORAL_CAPSULE | Freq: Every day | ORAL | Status: DC

## 2015-12-19 MED ORDER — GABAPENTIN 300 MG PO CAPS: 300.0000 mg | ORAL_CAPSULE | Freq: Three times a day (TID) | ORAL | Status: DC

## 2015-12-19 MED ORDER — MEGESTROL ACETATE 625 MG/5ML PO SUSP: 625.0000 mg | Freq: Every day | ORAL | Status: DC

## 2015-12-19 MED ORDER — ENSURE PO LIQD: 237.0000 mL | Freq: Three times a day (TID) | ORAL | Status: DC

## 2015-12-19 MED ORDER — LEVOTHYROXINE SODIUM 150 MCG PO TABS: 150.0000 ug | ORAL_TABLET | Freq: Every day | ORAL | Status: DC

## 2015-12-19 MED ORDER — METRONIDAZOLE 500 MG PO TABS: 500.0000 mg | ORAL_TABLET | Freq: Two times a day (BID) | ORAL | Status: DC

## 2015-12-19 MED ORDER — RITONAVIR 100 MG PO TABS: 100.0000 mg | ORAL_TABLET | Freq: Every day | ORAL | Status: DC

## 2015-12-19 MED ORDER — EMTRICITABINE-TENOFOVIR DF 200-300 MG PO TABS: 1.0000 | ORAL_TABLET | Freq: Every day | ORAL | Status: DC

## 2015-12-19 NOTE — Progress Notes (Signed)
Subjective:    chief complaints: foul-smelling vaginal discharge, painful neuropathic pain in her hands and feet and depression with insomnia and anhedonia   Patient ID: [name], female    DOB: 03-13-1951, 65 y.o.   MRN: [id number]  HPI   [name] is a 65 year old African-American lady with HIV disease who has been prescribed Reyataz NORVIR and Truvada not seen in clinic for some time.  She came in acutely with complaints of vaginal discharge also worsening painful neuropathic pains in her hands and her feet. She has significant anhedonia and depression as well.   she is currently sexually active with "the man who gave me HIV who is also married."   She states they have not been using condoms with intercourse but that this partner has been taking his medications for his HIV.    she is poorly distressed by the continued persistent pain in her hands and her feet and also by lack of interest in things and feeling fatigue and without much energy and feeling down.  Past Medical History  Diagnosis Date  . Arthritis   . COPD (chronic obstructive pulmonary disease) ([place])   . Lipoma     right forearm  . Headache(784.0)   . Anxiety   . Depression   . HIV (human immunodeficiency virus infection) ([place])     "dx'd ~ 12/2011"  . Chronic bronchitis   . Pneumonia ~ 2000's    "once"  . Shortness of breath on exertion   . 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 Fibromyalgia     "I think I got that"  . Thyroid cancer s/p total thyroidectomy April2013 02/06/2012    Per patient report.  Recently diagnosed. Awaiting procedure to have thyroid removed.  Under treatment with Dr Janace Hoard .   Marland Kitchen Asthma     Past Surgical History  Procedure Laterality Date  . Thyroidectomy  03/11/12    bilaterally  . Anterior cervical decomp/discectomy fusion  05/2009    C3-4; C5-6  . Dilation and curettage of uterus    . Thyroidectomy  03/11/2012    Procedure: THYROIDECTOMY;  Surgeon: Melissa Montane, MD;   Location: [facility] OR;  Service: ENT;  Laterality: Bilateral;    Family History  Problem Relation Age of Onset  . Heart disease Maternal Uncle       Social History   Social History  . Marital Status: Divorced    Spouse Name: N/A  . Number of Children: N/A  . Years of Education: 51   Social History Main Topics  . Smoking status: Current Every Day Smoker -- 1.00 packs/day for 47 years    Types: Cigarettes  . Smokeless tobacco: Former Systems developer    Quit date: 12/01/1974     Comment: currently smokes   . Alcohol Use: No     Comment: 03/11/12 "no beer or wine ~ 01/2012"  . Drug Use: No     Comment: 03/11/12 "last drug use early /2013"  . Sexual Activity:    Partners: Male    Patent examiner Protection: None, Post-menopausal   Other Topics Concern  . Not on file   Social History Narrative   Regular exercise-yes (walking)   Caffeine Use-yes          Allergies  Allergen Reactions  . Sulfa Antibiotics Hives and Other (See Comments)    "in my shoulders; legs; feet; made me burn all over"     Current outpatient prescriptions:  .  albuterol (PROVENTIL HFA;VENTOLIN HFA)  108 (90 BASE) MCG/ACT inhaler, Inhale 2 puffs into the lungs every 4 (four) hours as needed for wheezing or shortness of breath. (Patient not taking: Reported on 01/02/2015), Disp: 1 Inhaler, Rfl: 0 .  atazanavir (REYATAZ) 300 MG capsule, Take 1 capsule (300 mg total) by mouth daily with breakfast., Disp: 30 capsule, Rfl: 2 .  cephALEXin (KEFLEX) 500 MG capsule, Take 1 capsule (500 mg total) by mouth 2 (two) times daily., Disp: 14 capsule, Rfl: 0 .  citalopram (CELEXA) 20 MG tablet, Take 1 tablet (20 mg total) by mouth daily., Disp: 30 tablet, Rfl: 2 .  emtricitabine-tenofovir (TRUVADA) 200-300 MG tablet, Take 1 tablet by mouth daily., Disp: 30 tablet, Rfl: 2 .  ENSURE (ENSURE), Take 237 mLs by mouth 3 (three) times daily between meals., Disp: 237 mL, Rfl: 6 .  gabapentin (NEURONTIN) 300 MG capsule, Take 1 capsule (300 mg  total) by mouth 3 (three) times daily., Disp: 90 capsule, Rfl: 2 .  hydrOXYzine (ATARAX/VISTARIL) 25 MG tablet, Take 1 tablet (25 mg total) by mouth every 8 (eight) hours as needed for itching., Disp: 30 tablet, Rfl: 1 .  ibuprofen (ADVIL,MOTRIN) 200 MG tablet, Take 400 mg by mouth every 6 (six) hours as needed for mild pain or moderate pain., Disp: , Rfl:  .  levothyroxine (SYNTHROID, LEVOTHROID) 150 MCG tablet, Take 1 tablet (150 mcg total) by mouth daily., Disp: 30 tablet, Rfl: 2 .  megestrol (MEGACE ES) 625 MG/5ML suspension, Take 5 mLs (625 mg total) by mouth daily., Disp: 150 mL, Rfl: 3 .  metroNIDAZOLE (FLAGYL) 500 MG tablet, Take 1 tablet (500 mg total) by mouth 2 (two) times daily., Disp: 14 tablet, Rfl: 0 .  ritonavir (NORVIR) 100 MG TABS tablet, Take 1 tablet (100 mg total) by mouth daily., Disp: 30 tablet, Rfl: 2   Review of Systems  Constitutional: Negative for fever, chills, diaphoresis, activity change, appetite change, fatigue and unexpected weight change.  HENT: Negative for congestion, rhinorrhea, sinus pressure, sneezing, sore throat and trouble swallowing.   Eyes: Negative for photophobia and visual disturbance.  Respiratory: Negative for cough, chest tightness, shortness of breath, wheezing and stridor.   Cardiovascular: Negative for chest pain, palpitations and leg swelling.  Gastrointestinal: Negative for nausea, vomiting, abdominal pain, diarrhea, constipation, blood in stool, abdominal distention and anal bleeding.  Genitourinary: Positive for vaginal discharge. Negative for dysuria, hematuria, flank pain and difficulty urinating.  Musculoskeletal: Positive for myalgias and arthralgias. Negative for back pain, joint swelling and gait problem.  Skin: Negative for color change, pallor, rash and wound.  Neurological: Positive for numbness. Negative for dizziness, tremors, weakness and light-headedness.  Hematological: Negative for adenopathy. Does not bruise/bleed easily.    Psychiatric/Behavioral: Positive for sleep disturbance, dysphoric mood and decreased concentration. Negative for behavioral problems, confusion and agitation. The patient is nervous/anxious.        Objective:   Physical Exam  Constitutional: She is oriented to person, place, and time. She appears well-developed and well-nourished. No distress.  HENT:  Head: Normocephalic and atraumatic.  Mouth/Throat: No oropharyngeal exudate.  Eyes: Conjunctivae and EOM are normal. No scleral icterus.  Neck: Normal range of motion. Neck supple.  Cardiovascular: Normal rate and regular rhythm.   Pulmonary/Chest: Effort normal. No respiratory distress. She has no wheezes.  Abdominal: She exhibits no distension.  Musculoskeletal: She exhibits no edema or tenderness.  Neurological: She is alert and oriented to person, place, and time. She exhibits normal muscle tone. Coordination normal.  Skin: Skin is warm and  dry. No rash noted. She is not diaphoretic. No erythema. No pallor.  Psychiatric: Judgment and thought content normal. She exhibits a depressed mood.  Vitals reviewed.         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   #5  Post-thyroidectomy hypothyroidism: Not sure she has had thyroid function tests checked recently at all  I will have her come back the beginning of February to review labs and see how she is doing.  I spent greater than 25 minutes with the patient including greater than  50% of time in face to face counsel of the patient and  Regarding her HIV her depressive symptoms her vaginal discharge and her HIV neuropathy and in coordination of her care.

## 2015-12-19 NOTE — BH Specialist Note (Deleted)
Counselor met with client in exam room due to reported depression by Dr. Van Dam. Client was oriented time four with sad affect and but good appearance. Client was standoffish at first but throughout meeting client began to open up. Client shared that she is concerned about her health as she is loosing weight against her wishes. Client stated that he thoughts are negative but denies any suicidal ideation. Client shared that she thinks of her son who died in a car wreck several years ago. Counselor talked with client about negative self talk. Counselor shared with client that her stinking thinking was not helping her already difficult situation. At first client communicated she wanted no counseling because she thought it would make her more depressed and she did not want to cry more. Counselor reassured client and educated her that learning better co-ping skills can always benefit our mood despite what is happening in our lives. Client agreed that she would like to meet once or twice to vent some of her frustrations about life, her diagnosis and her job issues. Counselor encouraged client to make an appointment as soon as she could that was convenient for her.   Darin Arndt, MA [facility]        

## 2015-12-19 NOTE — BH Specialist Note (Signed)
Counselor met with client in exam room due to reported depression by Dr. Tommy Medal. Client was oriented time four with sad affect and but good appearance. Client was standoffish at first but throughout meeting client began to open up. Client shared that she is concerned about her health as she is loosing weight against her wishes. Client stated that he thoughts are negative but denies any suicidal ideation. Client shared that she thinks of her son who died in a car wreck several years ago. Counselor talked with client about negative self talk. Counselor shared with client that her stinking thinking was not helping her already difficult situation. At first client communicated she wanted no counseling because she thought it would make her more depressed and she did not want to cry more. Counselor reassured client and educated her that learning better co-ping skills can always benefit our mood despite what is happening in our lives. Client agreed that she would like to meet once or twice to vent some of her frustrations about life, her diagnosis and her job issues. Counselor encouraged client to make an appointment as soon as she could that was convenient for her.   Rolena Infante, MA [facility]

## 2015-12-20 LAB — T-HELPER CELL (CD4) - (RCID CLINIC ONLY)
CD4 T CELL ABS: 910 /uL (ref 400–2700)
CD4 T CELL HELPER: 57 % — AB (ref 33–55)

## 2015-12-21 ENCOUNTER — Ambulatory Visit (INDEPENDENT_AMBULATORY_CARE_PROVIDER_SITE_OTHER): Payer: Medicaid Other | Admitting: *Deleted

## 2015-12-21 ENCOUNTER — Other Ambulatory Visit (HOSPITAL_COMMUNITY)
Admission: RE | Admit: 2015-12-21 | Discharge: 2015-12-21 | Disposition: A | Discharge status: Home / Self Care | Payer: Medicaid Other | Source: Ambulatory Visit | Attending: Infectious Disease | Admitting: Infectious Disease

## 2015-12-21 ENCOUNTER — Ambulatory Visit: Payer: Medicaid Other | Admitting: *Deleted

## 2015-12-21 DIAGNOSIS — Z01411 Encounter for gynecological examination (general) (routine) with abnormal findings: Secondary | ICD-10-CM | POA: Insufficient documentation

## 2015-12-21 DIAGNOSIS — Z23 Encounter for immunization: Secondary | ICD-10-CM

## 2015-12-21 DIAGNOSIS — Z113 Encounter for screening for infections with a predominantly sexual mode of transmission: Secondary | ICD-10-CM | POA: Insufficient documentation

## 2015-12-21 DIAGNOSIS — N760 Acute vaginitis: Secondary | ICD-10-CM | POA: Diagnosis present

## 2015-12-21 DIAGNOSIS — Z124 Encounter for screening for malignant neoplasm of cervix: Secondary | ICD-10-CM | POA: Diagnosis not present

## 2015-12-21 LAB — HIV-1 RNA QUANT-NO REFLEX-BLD
HIV 1 RNA QUANT: 61 {copies}/mL — AB (ref <20)
HIV-1 RNA Quant, Log: 1.79 Log copies/mL — ABNORMAL HIGH (ref <1.30)

## 2015-12-21 NOTE — BH Specialist Note (Signed)
[name] walked in today and met with counselor.  Client was oriented times four with good affect and dress.  Client was in much better spirits today compared to her frame of mind when meeting with her earlier in the week.  Client shared that she wants to meet with counselor on a regular basis and feels she needs to vent about what is happening in her life. Counselor reassured client and communicated that she would truly benefit from coming to counseling where she can process indeed what's happening in her life that is difficult as well as learn better coping skills.  Counselor educated client on obtaining a better support network and self care.  Counselor shared with client about the [facility] support group that meets every other Friday at [place].  Client stated that she was interested and planned on coming to it. Client made another appointment with counselor for a week out.   Rolena Infante, MA [facility]

## 2015-12-21 NOTE — Progress Notes (Addendum)
  Subjective:     [name] is a 65 y.o. woman who comes in today for a  pap smear only.  Previous abnormal Pap smears: no. Contraception: condoms.   Objective:  Menopausal   Pelvic Exam: . Pap smear obtained.   Assessment:    Screening pap smear.   Plan:    Follow up in one year, or as indicated by Pap results.   Pt given educational materials re: HIV and women, self-esteem, BSE, nutrition and diet management, PAP smears and partner safety. Pt given condoms.

## 2015-12-21 NOTE — Patient Instructions (Signed)
  Your results for the vaginal discharge will be back by Monday.  Dr. Tommy Medal will contact you if you need treatment.  Your pap smear results will take about a week.  I will mail you your results.  Thank you for coming to the Center for your care.  Langley Gauss, RN

## 2015-12-24 ENCOUNTER — Telehealth: Payer: Self-pay | Admitting: *Deleted

## 2015-12-24 LAB — CYTOLOGY - PAP

## 2015-12-24 LAB — CERVICOVAGINAL ANCILLARY ONLY
Chlamydia: NEGATIVE
Neisseria Gonorrhea: NEGATIVE
TRICH (WINDOWPATH): POSITIVE — AB

## 2015-12-24 NOTE — Telephone Encounter (Signed)
Called patient and left a message for her to call the clinic. [name]

## 2015-12-24 NOTE — Telephone Encounter (Signed)
-----   Message from Truman Hayward, MD sent at 12/24/2015  3:55 PM EST ----- Patient with trichomonas which the flagyl should take care of. Her partner should be tested and treated and they should use protection until they have both been successfully treated

## 2015-12-24 NOTE — Addendum Note (Signed)
Addended by: Dolan Amen D on: 12/24/2015 09:24 AM   Modules accepted: Orders

## 2015-12-25 NOTE — Telephone Encounter (Signed)
Another voice mail was left today for patient to call the clinic. [name]

## 2015-12-26 ENCOUNTER — Encounter: Payer: Self-pay | Admitting: *Deleted

## 2015-12-26 ENCOUNTER — Ambulatory Visit: Payer: Medicaid Other | Admitting: Endocrinology

## 2016-01-03 ENCOUNTER — Ambulatory Visit: Payer: Medicaid Other | Admitting: Infectious Disease

## 2016-01-03 ENCOUNTER — Telehealth: Payer: Self-pay | Admitting: *Deleted

## 2016-01-03 NOTE — Addendum Note (Signed)
Addended by: Lorne Skeens D on: 01/03/2016 03:20 PM   Modules accepted: Orders

## 2016-01-03 NOTE — Telephone Encounter (Signed)
[name] would benefit from seeing [name] frequently. She has SIGNIFICANT psychiatric issues

## 2016-01-03 NOTE — Telephone Encounter (Signed)
Pt's appt was resceduled and she did not know, upset pt.  Pt complained of pain in her left groin, tingling in her hands, pain in knees and difficulty walking.  Now has a seated, rolling walker to use while ambulating.  Pt dissatisfied with her PCP.  Wanting to change.  RN was able to make the pt an appointment at [facility] for New Pt on Thurs., March 10 at 0930.  Pt given this information and directions to their location.  Pt also instructed to change her [place] [employer] to this office.  Pt has a return visit with Dr. Tommy Medal for Monday, Feb 6 at 1515.

## 2016-01-04 ENCOUNTER — Telehealth: Payer: Self-pay | Admitting: *Deleted

## 2016-01-04 NOTE — Telephone Encounter (Signed)
PA needed for Megace ES, completed and faxed as URGENT to Medicaid.

## 2016-01-07 ENCOUNTER — Ambulatory Visit: Payer: Medicaid Other | Admitting: Infectious Disease

## 2016-02-11 ENCOUNTER — Other Ambulatory Visit: Payer: Self-pay | Admitting: *Deleted

## 2016-02-11 DIAGNOSIS — F330 Major depressive disorder, recurrent, mild: Secondary | ICD-10-CM

## 2016-02-11 DIAGNOSIS — B20 Human immunodeficiency virus [HIV] disease: Secondary | ICD-10-CM

## 2016-02-11 DIAGNOSIS — F32A Depression, unspecified: Secondary | ICD-10-CM

## 2016-02-11 DIAGNOSIS — F329 Major depressive disorder, single episode, unspecified: Secondary | ICD-10-CM

## 2016-02-11 MED ORDER — CITALOPRAM HYDROBROMIDE 20 MG PO TABS: 20.0000 mg | ORAL_TABLET | Freq: Every day | ORAL | Status: DC

## 2016-02-11 MED ORDER — ATAZANAVIR SULFATE 300 MG PO CAPS: 300.0000 mg | ORAL_CAPSULE | Freq: Every day | ORAL | Status: DC

## 2016-02-11 MED ORDER — EMTRICITABINE-TENOFOVIR DF 200-300 MG PO TABS: 1.0000 | ORAL_TABLET | Freq: Every day | ORAL | Status: DC

## 2016-02-11 MED ORDER — RITONAVIR 100 MG PO TABS: 100.0000 mg | ORAL_TABLET | Freq: Every day | ORAL | Status: DC

## 2016-02-14 ENCOUNTER — Ambulatory Visit: Payer: Medicaid Other | Admitting: Family Medicine

## 2016-03-02 NOTE — Progress Notes (Signed)
   Subjective:    Patient ID: [name], female    DOB: 1951-03-25, 65 y.o.   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  2014: I-131 rx, 79 mCi  2014: post-therapy scan: remnant thyroid tissue.  Focal uptake inferior to the right thyroid bed. 2015: CT of C-spine makes no mention of the thyroid (2015-2016) did not return for f/u.      Review of Systems     Objective:   Physical Exam        Assessment & Plan:   This encounter was created in error - please disregard.

## 2016-03-03 ENCOUNTER — Encounter: Payer: Medicaid Other | Admitting: Endocrinology

## 2016-03-03 DIAGNOSIS — Z0289 Encounter for other administrative examinations: Secondary | ICD-10-CM

## 2016-03-19 ENCOUNTER — Ambulatory Visit: Payer: Medicaid Other | Admitting: Infectious Disease

## 2016-04-03 ENCOUNTER — Other Ambulatory Visit: Payer: Self-pay | Admitting: Infectious Disease

## 2016-04-03 DIAGNOSIS — G629 Polyneuropathy, unspecified: Secondary | ICD-10-CM

## 2016-05-02 ENCOUNTER — Other Ambulatory Visit: Payer: Self-pay | Admitting: Infectious Disease

## 2016-05-02 DIAGNOSIS — C73 Malignant neoplasm of thyroid gland: Secondary | ICD-10-CM

## 2016-05-27 ENCOUNTER — Other Ambulatory Visit: Payer: Self-pay | Admitting: Infectious Disease

## 2016-07-17 ENCOUNTER — Telehealth: Payer: Self-pay | Admitting: *Deleted

## 2016-07-17 NOTE — Telephone Encounter (Signed)
Patient called and advised she has been having hair loss, weight loss, and sleeplessness. She wants to be seen and get on some Seroquel. Advised her can make the appts but she will have to speak with the doctor about the medication. Appointments made for labs and office visit.

## 2016-07-24 ENCOUNTER — Other Ambulatory Visit: Payer: Medicaid Other

## 2016-07-31 ENCOUNTER — Other Ambulatory Visit: Payer: Self-pay | Admitting: Infectious Disease

## 2016-07-31 DIAGNOSIS — G629 Polyneuropathy, unspecified: Secondary | ICD-10-CM

## 2016-08-06 ENCOUNTER — Other Ambulatory Visit: Payer: Medicaid Other

## 2016-08-13 ENCOUNTER — Ambulatory Visit: Payer: Medicaid Other | Admitting: Infectious Disease

## 2016-08-22 ENCOUNTER — Ambulatory Visit: Payer: Medicaid Other

## 2016-08-22 ENCOUNTER — Encounter: Payer: Self-pay | Admitting: *Deleted

## 2016-09-09 ENCOUNTER — Other Ambulatory Visit: Payer: Self-pay | Admitting: Infectious Disease

## 2016-09-09 DIAGNOSIS — F32A Depression, unspecified: Secondary | ICD-10-CM

## 2016-09-09 DIAGNOSIS — B20 Human immunodeficiency virus [HIV] disease: Secondary | ICD-10-CM

## 2016-09-09 DIAGNOSIS — F330 Major depressive disorder, recurrent, mild: Secondary | ICD-10-CM

## 2016-09-09 DIAGNOSIS — G629 Polyneuropathy, unspecified: Secondary | ICD-10-CM

## 2016-09-09 DIAGNOSIS — F329 Major depressive disorder, single episode, unspecified: Secondary | ICD-10-CM

## 2016-11-12 ENCOUNTER — Encounter (HOSPITAL_COMMUNITY): Payer: Self-pay | Admitting: *Deleted

## 2016-11-12 ENCOUNTER — Emergency Department (HOSPITAL_COMMUNITY)
Admission: EM | Admit: 2016-11-12 | Discharge: 2016-11-13 | Disposition: A | Discharge status: Home / Self Care | Payer: Medicaid Other | Attending: Emergency Medicine | Admitting: Emergency Medicine

## 2016-11-12 DIAGNOSIS — F17210 Nicotine dependence, cigarettes, uncomplicated: Secondary | ICD-10-CM | POA: Insufficient documentation

## 2016-11-12 DIAGNOSIS — Z79899 Other long term (current) drug therapy: Secondary | ICD-10-CM | POA: Diagnosis not present

## 2016-11-12 DIAGNOSIS — J449 Chronic obstructive pulmonary disease, unspecified: Secondary | ICD-10-CM | POA: Diagnosis not present

## 2016-11-12 DIAGNOSIS — F1494 Cocaine use, unspecified with cocaine-induced mood disorder: Secondary | ICD-10-CM | POA: Diagnosis not present

## 2016-11-12 DIAGNOSIS — Z85850 Personal history of malignant neoplasm of thyroid: Secondary | ICD-10-CM | POA: Insufficient documentation

## 2016-11-12 DIAGNOSIS — M79652 Pain in left thigh: Secondary | ICD-10-CM | POA: Insufficient documentation

## 2016-11-12 DIAGNOSIS — F329 Major depressive disorder, single episode, unspecified: Secondary | ICD-10-CM | POA: Insufficient documentation

## 2016-11-12 DIAGNOSIS — R45851 Suicidal ideations: Secondary | ICD-10-CM

## 2016-11-12 DIAGNOSIS — F1994 Other psychoactive substance use, unspecified with psychoactive substance-induced mood disorder: Secondary | ICD-10-CM

## 2016-11-12 DIAGNOSIS — A5901 Trichomonal vulvovaginitis: Secondary | ICD-10-CM | POA: Diagnosis not present

## 2016-11-12 DIAGNOSIS — F0630 Mood disorder due to known physiological condition, unspecified: Secondary | ICD-10-CM

## 2016-11-12 DIAGNOSIS — M79651 Pain in right thigh: Secondary | ICD-10-CM | POA: Insufficient documentation

## 2016-11-12 DIAGNOSIS — Z9889 Other specified postprocedural states: Secondary | ICD-10-CM | POA: Diagnosis not present

## 2016-11-12 DIAGNOSIS — E039 Hypothyroidism, unspecified: Secondary | ICD-10-CM | POA: Insufficient documentation

## 2016-11-12 DIAGNOSIS — C73 Malignant neoplasm of thyroid gland: Secondary | ICD-10-CM

## 2016-11-12 DIAGNOSIS — Z8249 Family history of ischemic heart disease and other diseases of the circulatory system: Secondary | ICD-10-CM | POA: Diagnosis not present

## 2016-11-12 LAB — CBC
HCT: 34 % — ABNORMAL LOW (ref 36.0–46.0)
Hemoglobin: 11.2 g/dL — ABNORMAL LOW (ref 12.0–15.0)
MCH: 31 pg (ref 26.0–34.0)
MCHC: 32.9 g/dL (ref 30.0–36.0)
MCV: 94.2 fL (ref 78.0–100.0)
PLATELETS: 329 10*3/uL (ref 150–400)
RBC: 3.61 MIL/uL — AB (ref 3.87–5.11)
RDW: 14.1 % (ref 11.5–15.5)
WBC: 4.7 10*3/uL (ref 4.0–10.5)

## 2016-11-12 LAB — COMPREHENSIVE METABOLIC PANEL
ALT: 12 U/L — ABNORMAL LOW (ref 14–54)
ANION GAP: 13 (ref 5–15)
AST: 25 U/L (ref 15–41)
Albumin: 3.6 g/dL (ref 3.5–5.0)
Alkaline Phosphatase: 56 U/L (ref 38–126)
BUN: 14 mg/dL (ref 6–20)
CHLORIDE: 101 mmol/L (ref 101–111)
CO2: 23 mmol/L (ref 22–32)
Calcium: 9.2 mg/dL (ref 8.9–10.3)
Creatinine, Ser: 1.86 mg/dL — ABNORMAL HIGH (ref 0.44–1.00)
GFR, EST AFRICAN AMERICAN: 32 mL/min — AB (ref >60)
GFR, EST NON AFRICAN AMERICAN: 28 mL/min — AB (ref >60)
Glucose, Bld: 89 mg/dL (ref 65–99)
POTASSIUM: 3.6 mmol/L (ref 3.5–5.1)
Sodium: 137 mmol/L (ref 135–145)
TOTAL PROTEIN: 7.3 g/dL (ref 6.5–8.1)
Total Bilirubin: 0.3 mg/dL (ref 0.3–1.2)

## 2016-11-12 LAB — SALICYLATE LEVEL

## 2016-11-12 LAB — WET PREP, GENITAL
Clue Cells Wet Prep HPF POC: NONE SEEN
Sperm: NONE SEEN
YEAST WET PREP: NONE SEEN

## 2016-11-12 LAB — ACETAMINOPHEN LEVEL

## 2016-11-12 LAB — ETHANOL

## 2016-11-12 LAB — TSH: TSH: 89.849 u[IU]/mL — AB (ref 0.350–4.500)

## 2016-11-12 LAB — T4, FREE: Free T4: 0.31 ng/dL — ABNORMAL LOW (ref 0.61–1.12)

## 2016-11-12 MED ORDER — AZITHROMYCIN 250 MG PO TABS
1000.0000 mg | ORAL_TABLET | Freq: Once | ORAL | Status: AC
  Administered 2016-11-12: 1000 mg via ORAL
  Filled 2016-11-12: qty 4

## 2016-11-12 MED ORDER — LIDOCAINE HCL (PF) 1 % IJ SOLN
0.9000 mL | Freq: Once | INTRAMUSCULAR | Status: AC
  Administered 2016-11-12: 0.9 mL
  Filled 2016-11-12: qty 5

## 2016-11-12 MED ORDER — HYDROXYZINE HCL 25 MG PO TABS: 25.0000 mg | ORAL_TABLET | Freq: Three times a day (TID) | ORAL | Status: DC | PRN

## 2016-11-12 MED ORDER — ALBUTEROL SULFATE HFA 108 (90 BASE) MCG/ACT IN AERS: 2.0000 | INHALATION_SPRAY | RESPIRATORY_TRACT | Status: DC | PRN

## 2016-11-12 MED ORDER — CITALOPRAM HYDROBROMIDE 10 MG PO TABS
20.0000 mg | ORAL_TABLET | Freq: Every day | ORAL | Status: DC
  Administered 2016-11-13: 20 mg via ORAL
  Filled 2016-11-12 (×2): qty 2

## 2016-11-12 MED ORDER — GABAPENTIN 300 MG PO CAPS
300.0000 mg | ORAL_CAPSULE | Freq: Three times a day (TID) | ORAL | Status: DC
  Administered 2016-11-12 – 2016-11-13 (×3): 300 mg via ORAL
  Filled 2016-11-12 (×3): qty 1

## 2016-11-12 MED ORDER — LEVOTHYROXINE SODIUM 150 MCG PO TABS
150.0000 ug | ORAL_TABLET | Freq: Every day | ORAL | Status: DC
Start: 2016-11-12 — End: 2016-11-13
  Filled 2016-11-12: qty 1

## 2016-11-12 MED ORDER — CEFTRIAXONE SODIUM 250 MG IJ SOLR
250.0000 mg | Freq: Once | INTRAMUSCULAR | Status: AC
  Administered 2016-11-12: 250 mg via INTRAMUSCULAR
  Filled 2016-11-12: qty 250

## 2016-11-12 MED ORDER — ATAZANAVIR SULFATE 150 MG PO CAPS
300.0000 mg | ORAL_CAPSULE | Freq: Every day | ORAL | Status: DC
  Filled 2016-11-12: qty 2

## 2016-11-12 MED ORDER — RITONAVIR 100 MG PO TABS
100.0000 mg | ORAL_TABLET | Freq: Every day | ORAL | Status: DC
  Filled 2016-11-12: qty 1

## 2016-11-12 MED ORDER — METRONIDAZOLE 500 MG PO TABS
2000.0000 mg | ORAL_TABLET | Freq: Once | ORAL | Status: AC
  Administered 2016-11-12: 2000 mg via ORAL
  Filled 2016-11-12: qty 4

## 2016-11-12 MED ORDER — EMTRICITABINE-TENOFOVIR AF 200-25 MG PO TABS
1.0000 | ORAL_TABLET | Freq: Every day | ORAL | Status: DC
  Administered 2016-11-12 – 2016-11-13 (×2): 1 via ORAL
  Filled 2016-11-12 (×2): qty 1

## 2016-11-12 NOTE — ED Notes (Signed)
Patient aware of need for urine sample.  Has gone to bathroom multiple times and states "I'm not peeing in a cup for you".

## 2016-11-12 NOTE — ED Notes (Signed)
Unable to inventory patient's belongings.  All belongings collected in bags from triage, double bagged and labeled without removing any items.  Patient also requested RNs not to "touch my stuff".

## 2016-11-12 NOTE — ED Notes (Signed)
Dr Leonette Monarch at triage to eval pt. Pt is scrubs, wanded by security and sitter requested.

## 2016-11-12 NOTE — ED Provider Notes (Signed)
[facility] DEPT Provider Note   CSN: [id number] Arrival date & time: 11/12/16  1324     History   Chief Complaint Chief Complaint  Patient presents with  . Vaginal Pain  . Suicidal    HPI [name] is a 65 y.o. female.  The history is provided by the patient.  Mental Health Problem  Presenting symptoms: depression, suicidal thoughts and suicide attempt (burning sulfur in the house. was going to take all my pills, but did not take them)   Presenting symptoms: no hallucinations and no homicidal ideas   Associated symptoms: anhedonia and fatigue   Associated symptoms: no abdominal pain, no chest pain and no headaches   Groin Pain  This is a chronic problem. Episode onset: years. The problem occurs constantly. Pertinent negatives include no chest pain, no abdominal pain, no headaches and no shortness of breath. Nothing relieves the symptoms. Treatments tried: "some pills my doctor gives me"  pain is located on inner thighs. She does complain for vaginal discharge. Endorses unprotected sex.  Past Medical History:  Diagnosis Date  . Anxiety   . Arthritis   . Asthma   . Chronic bronchitis   . COPD (chronic obstructive pulmonary disease) ([place])   . Depression   . Fibromyalgia    "I think I got that"  . Headache(784.0)   . HIV (human immunodeficiency virus infection) ([place])    "dx'd ~ 12/2011"  . Lipoma    right forearm  . 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 Pneumonia ~ 2000's   "once"  . Shortness of breath on exertion   . Thyroid cancer s/p total thyroidectomy April2013 02/06/2012   Per patient report.  Recently diagnosed. Awaiting procedure to have thyroid removed.  Under treatment with Dr Janace Hoard .   Marland Kitchen Vaginal discharge 12/19/2015    Patient Active Problem List   Diagnosis Date Noted  . Vaginal discharge 12/19/2015  . 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 Pain in joint, shoulder region 08/08/2013  .  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 Rib fractures 07/07/2012  . Weight loss 07/07/2012  . Cervical lymphadenopathy 07/07/2012  . Lymphadenopathy 07/07/2012  . Syncope 05/09/2012  . Neck pain, musculoskeletal 05/09/2012  . Pain in joint of right elbow, possible small fracture 05/09/2012  . Tobacco abuse 05/09/2012  . 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 HIV (human immunodeficiency virus infection) ([place]) 02/06/2012  . LOOSE BODY IN KNEE 10/01/2007  . FRACTURE, TOE, RIGHT 10/01/2007  . ANKLE SPRAIN, RIGHT 10/01/2007  . OSTEOCHONDRITIS DISSECANS 06/09/2007  . KNEE PAIN 05/24/2007  . LIPOMA 01/28/2007  . Major depressive disorder, recurrent episode ([place]) 01/28/2007  . Anxiety state 01/28/2007  . TENSION HEADACHE 01/28/2007  . BRONCHITIS, CHRONIC 01/28/2007  . MENOPAUSAL SYNDROME 01/28/2007  . Enthesopathy of hip region 01/28/2007  . PARESTHESIA NOS 01/28/2007    Past Surgical History:  Procedure Laterality Date  . ANTERIOR CERVICAL DECOMP/DISCECTOMY FUSION  05/2009   C3-4; C5-6  . DILATION AND CURETTAGE OF UTERUS    . THYROIDECTOMY  03/11/12   bilaterally  . THYROIDECTOMY  03/11/2012   Procedure: THYROIDECTOMY;  Surgeon: Melissa Montane, MD;  Location: [facility];  Service: ENT;  Laterality: Bilateral;    OB History    No data available       Home Medications    Prior to Admission medications   Medication Sig Start Date End Date Taking? Authorizing Provider  albuterol (PROVENTIL  HFA;VENTOLIN HFA) 108 (90 BASE) MCG/ACT inhaler Inhale 2 puffs into the lungs every 4 (four) hours as needed for wheezing or shortness of breath. 08/10/14  Yes Kristen N Ward, DO  atazanavir (REYATAZ) 300 MG capsule Take 1 capsule (300 mg total) by mouth daily with breakfast. 02/11/16  Yes Truman Hayward, MD  citalopram (CELEXA) 20 MG tablet Take 1 tablet (20 mg total) by mouth daily. 02/11/16 02/10/17 Yes Truman Hayward, MD  emtricitabine-tenofovir (TRUVADA) 200-300 MG  tablet Take 1 tablet by mouth daily. 02/11/16  Yes Truman Hayward, MD  ENSURE (ENSURE) Take 237 mLs by mouth 3 (three) times daily between meals. 12/19/15  Yes Truman Hayward, MD  gabapentin (NEURONTIN) 300 MG capsule TAKE 1 CAPSULE BY MOUTH 3 TIMES DAILY. 07/31/16  Yes Truman Hayward, MD  hydrOXYzine (ATARAX/VISTARIL) 25 MG tablet Take 1 tablet (25 mg total) by mouth every 8 (eight) hours as needed for itching. 04/27/14  Yes Truman Hayward, MD  ibuprofen (ADVIL,MOTRIN) 200 MG tablet Take 400 mg by mouth every 6 (six) hours as needed for mild pain or moderate pain.   Yes Historical Provider, MD  levothyroxine (SYNTHROID, LEVOTHROID) 150 MCG tablet TAKE 1 TABLET BY MOUTH DAILY. 05/02/16  Yes Truman Hayward, MD  megestrol (MEGACE ES) 625 MG/5ML suspension Take 5 mLs (625 mg total) by mouth daily. 12/19/15  Yes Truman Hayward, MD  ritonavir (NORVIR) 100 MG TABS tablet Take 1 tablet (100 mg total) by mouth daily. 02/11/16  Yes Truman Hayward, MD  cephALEXin (KEFLEX) 500 MG capsule Take 1 capsule (500 mg total) by mouth 2 (two) times daily. Patient not taking: Reported on 11/12/2016 06/05/15   Glendell Docker, NP  metroNIDAZOLE (FLAGYL) 500 MG tablet Take 1 tablet (500 mg total) by mouth 2 (two) times daily. Patient not taking: Reported on 11/12/2016 12/19/15   Truman Hayward, MD    Family History Family History  Problem Relation Age of Onset  . Heart disease Maternal Uncle     Social History Social History  Substance Use Topics  . Smoking status: Current Every Day Smoker    Packs/day: 1.00    Years: 47.00    Types: Cigarettes  . Smokeless tobacco: Former Systems developer    Quit date: 12/01/1974     Comment: currently smokes   . Alcohol use No     Comment: 03/11/12 "no beer or wine ~ 01/2012"     Allergies   Sulfa antibiotics   Review of Systems Review of Systems  Constitutional: Positive for fatigue.  Respiratory: Negative for shortness of breath.     Cardiovascular: Negative for chest pain.  Gastrointestinal: Negative for abdominal pain.  Genitourinary: Positive for vaginal discharge. Negative for vaginal bleeding and vaginal pain.  Neurological: Negative for headaches.  Psychiatric/Behavioral: Positive for suicidal ideas. Negative for hallucinations and homicidal ideas.  Ten systems are reviewed and are negative for acute change except as noted in the HPI    Physical Exam Updated Vital Signs BP 103/71 (BP Location: Right Arm)   Pulse 62   Temp 98.5 F (36.9 C) (Oral)   Resp 16   Ht 5' 8.5" (1.74 m)   Wt 118 lb 8 oz (53.8 kg)   SpO2 100%   BMI 17.76 kg/m   Physical Exam  Constitutional: She is oriented to person, place, and time. She appears well-developed and well-nourished. No distress.  HENT:  Head: Normocephalic and atraumatic.  Nose: Nose normal.  Eyes: Conjunctivae and EOM are normal. Pupils are equal, round, and reactive to light. Right eye exhibits no discharge. Left eye exhibits no discharge. No scleral icterus.  Neck: Normal range of motion. Neck supple.  Cardiovascular: Normal rate and regular rhythm.  Exam reveals no gallop and no friction rub.   No murmur heard. Pulmonary/Chest: Effort normal and breath sounds normal. No stridor. No respiratory distress. She has no rales.  Abdominal: Soft. She exhibits no distension. There is no tenderness.  Genitourinary: Pelvic exam was performed with patient supine. Uterus is not tender. Cervix exhibits no motion tenderness, no discharge and no friability. Right adnexum displays no mass and no tenderness. Left adnexum displays no mass and no tenderness. There is erythema (strawberry cervix) in the vagina. Vaginal discharge found.  Genitourinary Comments: Chaperone present during pelvic exam.   Musculoskeletal: She exhibits no edema or tenderness.       Right hip: She exhibits no tenderness.       Left hip: She exhibits no tenderness.  Neurological: She is alert and  oriented to person, place, and time.  Skin: Skin is warm and dry. No rash noted. She is not diaphoretic. No erythema.  Psychiatric: She has a normal mood and affect.  Vitals reviewed.    ED Treatments / Results  Labs (all labs ordered are listed, but only abnormal results are displayed) Labs Reviewed  WET PREP, GENITAL - Abnormal; Notable for the following:       Result Value   Trich, Wet Prep PRESENT (*)    WBC, Wet Prep HPF POC MANY (*)    All other components within normal limits  COMPREHENSIVE METABOLIC PANEL - Abnormal; Notable for the following:    Creatinine, Ser 1.86 (*)    ALT 12 (*)    GFR calc non Af Amer 28 (*)    GFR calc Af Amer 32 (*)    All other components within normal limits  ACETAMINOPHEN LEVEL - Abnormal; Notable for the following:    Acetaminophen (Tylenol), Serum <10 (*)    All other components within normal limits  CBC - Abnormal; Notable for the following:    RBC 3.61 (*)    Hemoglobin 11.2 (*)    HCT 34.0 (*)    All other components within normal limits  TSH - Abnormal; Notable for the following:    TSH 89.849 (*)    All other components within normal limits  T4, FREE - Abnormal; Notable for the following:    Free T4 0.31 (*)    All other components within normal limits  ETHANOL  SALICYLATE LEVEL  RAPID URINE DRUG SCREEN, HOSP PERFORMED  HIV 1 RNA QUANT-NO REFLEX-BLD  T-HELPER CELLS (CD4) COUNT (NOT AT [facility])  GC/CHLAMYDIA PROBE AMP ([place]) NOT AT [facility]    EKG  EKG Interpretation None       Radiology No results found.  Procedures Procedures (including critical care time)  Medications Ordered in ED Medications  albuterol (PROVENTIL HFA;VENTOLIN HFA) 108 (90 Base) MCG/ACT inhaler 2 puff (not administered)  citalopram (CELEXA) tablet 20 mg (not administered)  emtricitabine-tenofovir AF (DESCOVY) 200-25 MG per tablet 1 tablet (not administered)  gabapentin (NEURONTIN) capsule 300 mg (not administered)  hydrOXYzine  (ATARAX/VISTARIL) tablet 25 mg (not administered)  levothyroxine (SYNTHROID, LEVOTHROID) tablet 150 mcg (not administered)  ritonavir (NORVIR) tablet 100 mg (not administered)  atazanavir (REYATAZ) capsule 300 mg (not administered)  cefTRIAXone (ROCEPHIN) injection 250 mg (not administered)  azithromycin (ZITHROMAX) tablet 1,000  mg (not administered)  metroNIDAZOLE (FLAGYL) tablet 2,000 mg (2,000 mg Oral Given 11/12/16 2048)     Initial Impression / Assessment and Plan / ED Course  I have reviewed the triage vital signs and the nursing notes.  Pertinent labs & imaging results that were available during my care of the patient were reviewed by me and considered in my medical decision making (see chart for details).  Clinical Course     1. Groin pain. Chronic in nature. No recent trauma. Pain is located on medial aspect of bilateral thighs. No tenderness to palpation. No indication for imaging at this time. No LE edema concerning for DVT.  2. Vaginal discharge.  Pelvic exam consistent with vaginatis. Wet prep with +Trich. Will treat with 2g of Flagyl and will treat for possible GC/[place] with CTX and Azithro given the lack of PID on exam.  3. SI Medically cleared for [facility] evaluation and management.  Started on her home meds  4. Chronic disorders - H/o HIV no recent quant or CD4. Labs sent. Started on HAART meds - H/o hypothyroidism. Started on synthroid   Final Clinical Impressions(s) / ED Diagnoses   Final diagnoses:  Trichomonal vaginitis  Pain in both thighs  Suicidal ideation      Fatima Blank, MD 11/12/16 2122

## 2016-11-12 NOTE — ED Notes (Addendum)
Pt at desk yelling at staff.  Patient's sister in room.  Patient upset about her care.  Stating we have not done anything for her.  Patient also wanted named of RNs and Doctors claiming she never told anyone she had beg bugs, and does not appreciate [place] stating so.  This RN apologize, attempted to redirect patient and asked patient to return to room.  Pt still verbally agitated, but returned to room

## 2016-11-12 NOTE — BH Assessment (Signed)
Therapist attempted to complete assessment. However, pt refused to participate in assessment.  Pt had head covered with sheets and yelled "I don't need any help and I do not want to talk to anybody so get that camera out of my face."   TTS will attempt assessment again later in the evening or in the morning per EDP request.   Murtis Sink, LPC, [place],  LCAS-A Therapeutic Triage Specialist  11/12/2016 9:53 PM

## 2016-11-12 NOTE — ED Notes (Signed)
Snack at bedside.

## 2016-11-12 NOTE — ED Notes (Signed)
Food tray at bedside.

## 2016-11-12 NOTE — ED Triage Notes (Signed)
Pt reports severe pain to vaginal area for extended amount of time. Reports chronic pain and depression, having thoughts of suicide today but denies any attempts.

## 2016-11-12 NOTE — ED Notes (Signed)
Sister - Rushie Ludolph [phone]  Pt's sister states patient has been smoking crack, has not been sleeping and shared suicidal thoughts with her which prompted her to bring her to the hospital today.

## 2016-11-12 NOTE — ED Triage Notes (Signed)
PT refuse a shower because she took a shower at home 3 hrs ago. Pt tearful on arrival to room.  Pt reported why did my sister leave me hear.

## 2016-11-12 NOTE — ED Notes (Signed)
ED Provider at bedside. 

## 2016-11-13 DIAGNOSIS — Z8249 Family history of ischemic heart disease and other diseases of the circulatory system: Secondary | ICD-10-CM

## 2016-11-13 DIAGNOSIS — Z9889 Other specified postprocedural states: Secondary | ICD-10-CM | POA: Diagnosis not present

## 2016-11-13 DIAGNOSIS — F0630 Mood disorder due to known physiological condition, unspecified: Secondary | ICD-10-CM | POA: Diagnosis not present

## 2016-11-13 DIAGNOSIS — F1494 Cocaine use, unspecified with cocaine-induced mood disorder: Secondary | ICD-10-CM

## 2016-11-13 DIAGNOSIS — F1994 Other psychoactive substance use, unspecified with psychoactive substance-induced mood disorder: Secondary | ICD-10-CM

## 2016-11-13 DIAGNOSIS — Z882 Allergy status to sulfonamides status: Secondary | ICD-10-CM

## 2016-11-13 DIAGNOSIS — Z79899 Other long term (current) drug therapy: Secondary | ICD-10-CM

## 2016-11-13 DIAGNOSIS — F17210 Nicotine dependence, cigarettes, uncomplicated: Secondary | ICD-10-CM

## 2016-11-13 LAB — RAPID URINE DRUG SCREEN, HOSP PERFORMED
AMPHETAMINES: NOT DETECTED
BENZODIAZEPINES: NOT DETECTED
Barbiturates: NOT DETECTED
Cocaine: POSITIVE — AB
Opiates: NOT DETECTED
TETRAHYDROCANNABINOL: NOT DETECTED

## 2016-11-13 LAB — BASIC METABOLIC PANEL
ANION GAP: 10 (ref 5–15)
BUN: 12 mg/dL (ref 6–20)
CO2: 24 mmol/L (ref 22–32)
Calcium: 8.9 mg/dL (ref 8.9–10.3)
Chloride: 105 mmol/L (ref 101–111)
Creatinine, Ser: 1.45 mg/dL — ABNORMAL HIGH (ref 0.44–1.00)
GFR, EST AFRICAN AMERICAN: 43 mL/min — AB (ref >60)
GFR, EST NON AFRICAN AMERICAN: 37 mL/min — AB (ref >60)
GLUCOSE: 107 mg/dL — AB (ref 65–99)
POTASSIUM: 3.7 mmol/L (ref 3.5–5.1)
Sodium: 139 mmol/L (ref 135–145)

## 2016-11-13 LAB — HIV-1 RNA QUANT-NO REFLEX-BLD
HIV 1 RNA Quant: <20 copies/mL
LOG10 HIV-1 RNA: UNDETERMINED {Log_copies}/mL

## 2016-11-13 LAB — GC/CHLAMYDIA PROBE AMP (~~LOC~~) NOT AT ARMC
Chlamydia: NEGATIVE
NEISSERIA GONORRHEA: NEGATIVE

## 2016-11-13 LAB — T-HELPER CELLS (CD4) COUNT (NOT AT ARMC)
CD4 % Helper T Cell: 53 % (ref 33–55)
CD4 T Cell Abs: 880 /uL (ref 400–2700)

## 2016-11-13 MED ORDER — LEVOTHYROXINE SODIUM 150 MCG PO TABS: 150.0000 ug | ORAL_TABLET | Freq: Every day | ORAL | 0 refills | Status: DC

## 2016-11-13 MED ORDER — LEVOTHYROXINE SODIUM 150 MCG PO TABS
150.0000 ug | ORAL_TABLET | Freq: Every day | ORAL | Status: DC
  Filled 2016-11-13: qty 1

## 2016-11-13 NOTE — ED Notes (Signed)
Patient was given a snack and drink, and a Regular Diet was ordered for Lunch. 

## 2016-11-13 NOTE — ED Notes (Signed)
Patient ambulated to the restroom to get dressed. Rn attempted to to contact Caren Griffins (patient sister) per patient . RN left and voicemail.

## 2016-11-13 NOTE — ED Notes (Signed)
Patient sister at bedside. pat

## 2016-11-13 NOTE — ED Provider Notes (Signed)
Notified by [facility] that pt is ready for discharge. Pt now denies SI, HI, and is referred for outpt substance abuse counseling. Of note, labs, imaging reviewed. Pt has markedly elevated TSH and low free T4, c/w hypothyroidism. Patient has history of hypothyroidism with chronic nonadherence and history of similarly elevated, although not as severe, TSH values. She is mentating well and her vital signs are at her baseline. She has no significant hyperthermia or bradycardia. Her blood pressure is in the 90 systolic which is her baseline per review of previous visits. Her sodium level was normal and lab work is otherwise at baseline. Will represcribed her usual dose of Synthroid and advised strict adherence. She will follow up with her doctor in 1 week for repeat check.   Duffy Bruce, MD 11/13/16 [phone]

## 2016-11-13 NOTE — Consult Note (Signed)
[place] [facility] Consult   Reason for Consult:  Substance abuse and non cooperative with TTS Referring Physician:  Dr. Dwyane Dee Patient Identification: [name] MRN:  [id number] Principal Diagnosis: Psychoactive substance-induced mood disorder [facility]) Diagnosis:   Patient Active Problem List   Diagnosis Date Noted  . Psychoactive substance-induced mood disorder ([place]) [N02.72, F06.30] 11/13/2016  . Vaginal discharge [N89.8] 12/19/2015  . 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 Pain in joint, shoulder region [M25.519] 08/08/2013  . 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 Rib fractures [S22.39XA] 07/07/2012  . Weight loss [R63.4] 07/07/2012  . Cervical lymphadenopathy [R59.0] 07/07/2012  . Lymphadenopathy [R59.1] 07/07/2012  . Syncope [R55] 05/09/2012  . Neck pain, musculoskeletal [M54.2] 05/09/2012  . Pain in joint of right elbow, possible small fracture [M25.521] 05/09/2012  . Tobacco abuse [Z72.0] 05/09/2012  . Thyroid cancer s/p total thyroidectomy April2013 [C73] 02/06/2012  . HIV (human immunodeficiency virus infection) ([place]) [B20] 02/06/2012  . LOOSE BODY IN KNEE [M23.40] 10/01/2007  . FRACTURE, TOE, RIGHT [S92.919A] 10/01/2007  . ANKLE SPRAIN, RIGHT [S93.409A] 10/01/2007  . OSTEOCHONDRITIS DISSECANS [M93.20] 06/09/2007  . KNEE PAIN [M25.569] 05/24/2007  . LIPOMA [D17.9] 01/28/2007  . Major depressive disorder, recurrent episode ([place]) [F33.9] 01/28/2007  . Anxiety state [F41.1] 01/28/2007  . TENSION HEADACHE [G44.209] 01/28/2007  . BRONCHITIS, CHRONIC [J41.0] 01/28/2007  . MENOPAUSAL SYNDROME [N95.1] 01/28/2007  . Enthesopathy of hip region [facility] 01/28/2007  . PARESTHESIA NOS [R20.9] 01/28/2007    Total Time spent with patient: 1 hour  Subjective:   [name] is a 65 y.o. female patient admitted with substance abuse and non cooperative in ER.  HPI:  [name] is an 65 y.o. female, Seen, chart reviewed for this  face-to-face psychiatric consultation and reportedly she has been noncompliant with TTS counselor's and staff nurse. 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 Patient reported that she has been having pains in her legs and hands and asking for her medication. Patient denied current symptoms of depression, anxiety, auditory/visual hallucinations, delusions or paranoia. Patient denied drinking alcohol but endorses smoking  Pt would and tobacco and asking permission to go out to smoke. Patient urine drug screen is positive for cocaine only. Patient denies active suicidal/homicidal ideation, intention or plans. Patient has no evidence of active psychosis or withdrawal symptoms.  Past Psychiatric History: History of polysubstane abuse and non compliant with psych out patient treatment at [facility] over years as per family.   Risk to Self: Suicidal Ideation: No Suicidal Intent: No Is patient at risk for suicide?: No Suicidal Plan?: No Access to Means: No What has been your use of drugs/alcohol within the last 12 months?: crack cocaine How many times?: 0 Other Self Harm Risks: SA Triggers for Past Attempts: None known Intentional Self Injurious Behavior: None Risk to Others: Homicidal Ideation: No Thoughts of Harm to Others: No Current Homicidal Intent: No Current Homicidal Plan: No Access to Homicidal Means: No Identified Victim: NA History of harm to others?: No Assessment of Violence: None Noted Violent Behavior Description: NA Does patient have access to weapons?: No Criminal Charges Pending?: No Does patient have a court date: No Prior Inpatient Therapy: Prior Inpatient Therapy: Yes Prior Therapy Dates: NA Prior Therapy Facilty/Provider(s): NA Reason for Treatment: NA Prior Outpatient Therapy: Prior Outpatient Therapy: Yes Prior Therapy Dates: UTA Prior Therapy Facilty/Provider(s): [facility] Reason for Treatment:  Schizophrenia Does patient have an ACCT team?: No Does patient have  Intensive In-House Services?  : No Does patient have [facility] services? : No Does patient have P4CC services?: No  Past Medical History:  Past Medical History:  Diagnosis Date  . Anxiety   . Arthritis   . Asthma   . Chronic bronchitis   . COPD (chronic obstructive pulmonary disease) ([place])   . Depression   . Fibromyalgia    "I think I got that"  . Headache(784.0)   . HIV (human immunodeficiency virus infection) ([place])    "dx'd ~ 12/2011"  . Lipoma    right forearm  . 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 Papillary carcinoma of thyroid ([place]) 03/11/12  . Pneumonia ~ 2000's   "once"  . Shortness of breath on exertion   . Thyroid cancer s/p total thyroidectomy April2013 02/06/2012   Per patient report.  Recently diagnosed. Awaiting procedure to have thyroid removed.  Under treatment with Dr Janace Hoard .   Marland Kitchen Vaginal discharge 12/19/2015    Past Surgical History:  Procedure Laterality Date  . ANTERIOR CERVICAL DECOMP/DISCECTOMY FUSION  05/2009   C3-4; C5-6  . DILATION AND CURETTAGE OF UTERUS    . THYROIDECTOMY  03/11/12   bilaterally  . THYROIDECTOMY  03/11/2012   Procedure: THYROIDECTOMY;  Surgeon: Melissa Montane, MD;  Location: [facility] OR;  Service: ENT;  Laterality: Bilateral;   Family History:  Family History  Problem Relation Age of Onset  . Heart disease Maternal Uncle    Family Psychiatric  History: Patient reported she has no family history of mental illness and she has a sister who is supportive to her. Social History:  History  Alcohol Use No    Comment: 03/11/12 "no beer or wine ~ 01/2012"     History  Drug Use No    Comment: 03/11/12 "last drug use early /2013"    Social History   Social History  . Marital status: Divorced    Spouse name: N/A  . Number of children: N/A  . Years of education: 29   Social History Main Topics  . Smoking status: Current Every Day Smoker    Packs/day: 1.00    Years: 47.00     Types: Cigarettes  . Smokeless tobacco: Former Systems developer    Quit date: 12/01/1974     Comment: currently smokes   . Alcohol use No     Comment: 03/11/12 "no beer or wine ~ 01/2012"  . Drug use: No     Comment: 03/11/12 "last drug use early /2013"  . Sexual activity: Not Currently    Partners: Male    Birth control/ protection: None, Post-menopausal   Other Topics Concern  . None   Social History Narrative   Regular exercise-yes (walking)   Caffeine Use-yes         Additional Social History:    Allergies:   Allergies  Allergen Reactions  . Sulfa Antibiotics Hives and Other (See Comments)    "in my shoulders; legs; feet; made me burn all over"    Labs:  Results for orders placed or performed during the hospital encounter of 11/12/16 (from the past 48 hour(s))  Comprehensive metabolic panel     Status: Abnormal   Collection Time: 11/12/16  1:59 PM  Result Value Ref Range   Sodium 137 135 - 145 mmol/L   Potassium 3.6 3.5 - 5.1 mmol/L   Chloride 101 101 - 111 mmol/L   CO2 23 22 - 32 mmol/L   Glucose, Bld 89 65 - 99 mg/dL   BUN  14 6 - 20 mg/dL   Creatinine, Ser 1.86 (H) 0.44 - 1.00 mg/dL   Calcium 9.2 8.9 - 10.3 mg/dL   Total Protein 7.3 6.5 - 8.1 g/dL   Albumin 3.6 3.5 - 5.0 g/dL   AST 25 15 - 41 U/L   ALT 12 (L) 14 - 54 U/L   Alkaline Phosphatase 56 38 - 126 U/L   Total Bilirubin 0.3 0.3 - 1.2 mg/dL   GFR calc non Af Amer 28 (L) >60 mL/min   GFR calc Af Amer 32 (L) >60 mL/min    Comment: (NOTE) The eGFR has been calculated using the CKD EPI equation. This calculation has not been validated in all clinical situations. eGFR's persistently <60 mL/min signify possible Chronic Kidney Disease.    Anion gap 13 5 - 15  Ethanol     Status: None   Collection Time: 11/12/16  1:59 PM  Result Value Ref Range   Alcohol, Ethyl (B) <5 <5 mg/dL    Comment:        LOWEST DETECTABLE LIMIT FOR SERUM ALCOHOL IS 5 mg/dL FOR MEDICAL PURPOSES ONLY   Salicylate level     Status: None    Collection Time: 11/12/16  1:59 PM  Result Value Ref Range   Salicylate Lvl <7.6 2.8 - 30.0 mg/dL  Acetaminophen level     Status: Abnormal   Collection Time: 11/12/16  1:59 PM  Result Value Ref Range   Acetaminophen (Tylenol), Serum <10 (L) 10 - 30 ug/mL    Comment:        THERAPEUTIC CONCENTRATIONS VARY SIGNIFICANTLY. A RANGE OF 10-30 ug/mL MAY BE AN EFFECTIVE CONCENTRATION FOR MANY PATIENTS. HOWEVER, SOME ARE BEST TREATED AT CONCENTRATIONS OUTSIDE THIS RANGE. ACETAMINOPHEN CONCENTRATIONS >150 ug/mL AT 4 HOURS AFTER INGESTION AND >50 ug/mL AT 12 HOURS AFTER INGESTION ARE OFTEN ASSOCIATED WITH TOXIC REACTIONS.   cbc     Status: Abnormal   Collection Time: 11/12/16  1:59 PM  Result Value Ref Range   WBC 4.7 4.0 - 10.5 K/uL   RBC 3.61 (L) 3.87 - 5.11 MIL/uL   Hemoglobin 11.2 (L) 12.0 - 15.0 g/dL   HCT 34.0 (L) 36.0 - 46.0 %   MCV 94.2 78.0 - 100.0 fL   MCH 31.0 26.0 - 34.0 pg   MCHC 32.9 30.0 - 36.0 g/dL   RDW 14.1 11.5 - 15.5 %   Platelets 329 150 - 400 K/uL  Wet prep, genital     Status: Abnormal   Collection Time: 11/12/16  6:15 PM  Result Value Ref Range   Yeast Wet Prep HPF POC NONE SEEN NONE SEEN   Trich, Wet Prep PRESENT (A) NONE SEEN   Clue Cells Wet Prep HPF POC NONE SEEN NONE SEEN   WBC, Wet Prep HPF POC MANY (A) NONE SEEN   Sperm NONE SEEN   TSH     Status: Abnormal   Collection Time: 11/12/16  6:38 PM  Result Value Ref Range   TSH 89.849 (H) 0.350 - 4.500 uIU/mL    Comment: Performed by a 3rd Generation assay with a functional sensitivity of <=0.01 uIU/mL. RESULT CONFIRMED BY AUTOMATED DILUTION   T4, free     Status: Abnormal   Collection Time: 11/12/16  6:38 PM  Result Value Ref Range   Free T4 0.31 (L) 0.61 - 1.12 ng/dL    Comment: (NOTE) Biotin ingestion may interfere with free T4 tests. If the results are inconsistent with the TSH level, previous test results, or the  clinical presentation, then consider biotin interference. If needed, order  repeat testing after stopping biotin.   Rapid urine drug screen (hospital performed)     Status: Abnormal   Collection Time: 11/13/16  4:04 AM  Result Value Ref Range   Opiates NONE DETECTED NONE DETECTED   Cocaine POSITIVE (A) NONE DETECTED   Benzodiazepines NONE DETECTED NONE DETECTED   Amphetamines NONE DETECTED NONE DETECTED   Tetrahydrocannabinol NONE DETECTED NONE DETECTED   Barbiturates NONE DETECTED NONE DETECTED    Comment:        DRUG SCREEN FOR MEDICAL PURPOSES ONLY.  IF CONFIRMATION IS NEEDED FOR ANY PURPOSE, NOTIFY LAB WITHIN 5 DAYS.        LOWEST DETECTABLE LIMITS FOR URINE DRUG SCREEN Drug Class       Cutoff (ng/mL) Amphetamine      1000 Barbiturate      200 Benzodiazepine   782 Tricyclics       956 Opiates          300 Cocaine          300 THC              50     Current Facility-Administered Medications  Medication Dose Route Frequency Provider Last Rate Last Dose  . albuterol (PROVENTIL HFA;VENTOLIN HFA) 108 (90 Base) MCG/ACT inhaler 2 puff  2 puff Inhalation Q4H PRN Fatima Blank, MD      . atazanavir (REYATAZ) capsule 300 mg  300 mg Oral Q breakfast Fatima Blank, MD      . citalopram (CELEXA) tablet 20 mg  20 mg Oral Daily Fatima Blank, MD   20 mg at 11/13/16 0954  . emtricitabine-tenofovir AF (DESCOVY) 200-25 MG per tablet 1 tablet  1 tablet Oral Daily Fatima Blank, MD   1 tablet at 11/13/16 [phone]  . gabapentin (NEURONTIN) capsule 300 mg  300 mg Oral TID Fatima Blank, MD   300 mg at 11/13/16 0954  . hydrOXYzine (ATARAX/VISTARIL) tablet 25 mg  25 mg Oral Q8H PRN Fatima Blank, MD      . levothyroxine (SYNTHROID, LEVOTHROID) tablet 150 mcg  150 mcg Oral QAC breakfast Haze Boyden, [facility]      . ritonavir (NORVIR) tablet 100 mg  100 mg Oral Q breakfast Fatima Blank, MD       Current Outpatient Prescriptions  Medication Sig Dispense Refill  . albuterol (PROVENTIL HFA;VENTOLIN HFA) 108 (90 BASE) MCG/ACT  inhaler Inhale 2 puffs into the lungs every 4 (four) hours as needed for wheezing or shortness of breath. 1 Inhaler 0  . atazanavir (REYATAZ) 300 MG capsule Take 1 capsule (300 mg total) by mouth daily with breakfast. 30 capsule 5  . citalopram (CELEXA) 20 MG tablet Take 1 tablet (20 mg total) by mouth daily. 30 tablet 5  . emtricitabine-tenofovir (TRUVADA) 200-300 MG tablet Take 1 tablet by mouth daily. 30 tablet 5  . ENSURE (ENSURE) Take 237 mLs by mouth 3 (three) times daily between meals. 237 mL 6  . gabapentin (NEURONTIN) 300 MG capsule TAKE 1 CAPSULE BY MOUTH 3 TIMES DAILY. 90 capsule 0  . hydrOXYzine (ATARAX/VISTARIL) 25 MG tablet Take 1 tablet (25 mg total) by mouth every 8 (eight) hours as needed for itching. 30 tablet 1  . ibuprofen (ADVIL,MOTRIN) 200 MG tablet Take 400 mg by mouth every 6 (six) hours as needed for mild pain or moderate pain.    Marland Kitchen levothyroxine (SYNTHROID, LEVOTHROID) 150 MCG tablet TAKE 1  TABLET BY MOUTH DAILY. 30 tablet 0  . megestrol (MEGACE ES) 625 MG/5ML suspension Take 5 mLs (625 mg total) by mouth daily. 150 mL 3  . ritonavir (NORVIR) 100 MG TABS tablet Take 1 tablet (100 mg total) by mouth daily. 30 tablet 5  . cephALEXin (KEFLEX) 500 MG capsule Take 1 capsule (500 mg total) by mouth 2 (two) times daily. (Patient not taking: Reported on 11/12/2016) 14 capsule 0  . metroNIDAZOLE (FLAGYL) 500 MG tablet Take 1 tablet (500 mg total) by mouth 2 (two) times daily. (Patient not taking: Reported on 11/12/2016) 14 tablet 0    Musculoskeletal: Strength & Muscle Tone: within normal limits Gait & Station: normal Patient leans: N/A  Psychiatric Specialty Exam: Physical Exam Full physical performed in Emergency Department. I have reviewed this assessment and concur with its findings.   ROS  Complaining about leg pain, hand pain and neuropathy's and also craving for tobacco. Denied nausea, vomiting, abdomen pain, shortness of breath and chest pain. No Fever-chills, No  Headache, No changes with Vision or hearing, reports vertigo No problems swallowing food or Liquids, No Chest pain, Cough or Shortness of Breath, No Abdominal pain, No Nausea or Vommitting, Bowel movements are regular, No Blood in stool or Urine, No dysuria, No new skin rashes or bruises, No new joints pains-aches,  No new weakness, tingling, numbness in any extremity, No recent weight gain or loss, No polyuria, polydypsia or polyphagia,   A full 10 point Review of Systems was done, except as stated above, all other Review of Systems were negative.  Blood pressure 90/68, pulse 67, temperature 98.5 F (36.9 C), temperature source Oral, resp. rate 18, height 5' 8.5" (1.74 m), weight 53.8 kg (118 lb 8 oz), SpO2 100 %.Body mass index is 17.76 kg/m.  General Appearance: Guarded  Eye Contact:  Good  Speech:  Clear and Coherent  Volume:  Decreased  Mood:  Irritable  Affect:  Appropriate and Congruent  Thought Process:  Coherent and Goal Directed  Orientation:  Full (Time, Place, and Person)  Thought Content:  WDL  Suicidal Thoughts:  No  Homicidal Thoughts:  No  Memory:  Immediate;   Good Recent;   Fair Remote;   Good  Judgement:  Impaired  Insight:  Good  Psychomotor Activity:  Decreased  Concentration:  Concentration: Good and Attention Span: Good  Recall:  Good  Fund of Knowledge:  Good  Language:  Good  Akathisia:  Negative  Handed:  Right  AIMS (if indicated):     Assets:  Communication Skills Desire for Improvement Financial Resources/Insurance Housing Leisure Time Resilience Social Support  ADL's:  Intact  Cognition:  WNL  Sleep:        Treatment Plan Summary: 65 years old female with multiple medical problems including chronic HIV and HIV neuropathy presented with intoxication with cocaine and substance abuse mood disorder and reportedly suicidal ideation.   Patient is sober from intoxication or drug which is cocaine Patient has no safety concerns at this  time Patient's craving for tobacco smoking Patient is asking medication for neuropathy No psychiatric medication recommended at this time Patient is psychiatrically cleared for the outpatient substance abuse treatment   Disposition: Please provide outpatient substance abuse referrals for the patient at the time of discharge. No evidence of imminent risk to self or others at present.   Supportive therapy provided about ongoing stressors.  Ambrose Finland, MD 11/13/2016 11:19 AM

## 2016-11-13 NOTE — ED Notes (Addendum)
Per Ria Comment, [facility], Dr Louretta Shorten to see pt in person today.

## 2016-11-13 NOTE — ED Notes (Signed)
Patient Denies SI/ HI however patient refuses to answer any other questions. Patient states "I want to smoke a Cigarette" RN offered Nicotine match patient refused.

## 2016-11-13 NOTE — BH Assessment (Signed)
Tele Assessment Note   [name] is an 65 y.o. female. [name] would not allow the tele-psych machine to be placed in her room. [name] refused to talk with this Probation officer on the phone.  Collateral information gathered from Rachael niece [name]: Per [name] has been diagnosed with Schizophrenia but has not had her medication in "years." [name] was previously a [name] at [employer] but has not been in many years. [name] is not compliant with mental medication or HIV medication. [name] lives alone. [name] contacted the [name]'s mother ([name]) to inform her that she was suicidal. [name] transported her to the hospital. [name] informed RN and EDP that she was suicidal as well.  [name] will be seen by Dr. Louretta Shorten for final disposition.  Diagnosis: F20.9 Schizophrenia   Past Medical History:  Past Medical History:  Diagnosis Date  . Anxiety   . Arthritis   . Asthma   . Chronic bronchitis   . COPD (chronic obstructive pulmonary disease) ([place])   . Depression   . Fibromyalgia    "I think I got that"  . Headache(784.0)   . HIV (human immunodeficiency virus infection) ([place])    "dx'd ~ 12/2011"  . Lipoma    right forearm  . 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 Pneumonia ~ 2000's   "once"  . Shortness of breath on exertion   . Thyroid cancer s/p total thyroidectomy April2013 02/06/2012   Per patient report.  Recently diagnosed. Awaiting procedure to have thyroid removed.  Under treatment with Dr Janace Hoard .   Marland Kitchen Vaginal discharge 12/19/2015    Past Surgical History:  Procedure Laterality Date  . ANTERIOR CERVICAL DECOMP/DISCECTOMY FUSION  05/2009   C3-4; C5-6  . DILATION AND CURETTAGE OF UTERUS    . THYROIDECTOMY  03/11/12   bilaterally  . THYROIDECTOMY  03/11/2012   Procedure: THYROIDECTOMY;  Surgeon: Melissa Montane, MD;  Location: [facility] OR;  Service: ENT;  Laterality: Bilateral;    Family History:  Family History  Problem Relation Age of Onset  . Heart  disease Maternal Uncle     Social History:  reports that she has been smoking Cigarettes.  She has a 47.00 pack-year smoking history. She quit smokeless tobacco use about 41 years ago. She reports that she does not drink alcohol or use drugs.  Additional Social History:  Alcohol / Drug Use Pain Medications: UTA Prescriptions: UTA Over the Counter: UTA History of alcohol / drug use?: Yes Longest period of sobriety (when/how long): UTA Substance #1 Name of Substance 1: crack cocaine 1 - Age of First Use: UTA 1 - Amount (size/oz): UTA 1 - Frequency: UTA 1 - Duration: UTA 1 - Last Use / Amount: UTA  CIWA: CIWA-Ar BP: 90/68 Pulse Rate: 67 COWS:    PATIENT STRENGTHS: (choose at least two) Communication skills Supportive family/friends  Allergies:  Allergies  Allergen Reactions  . Sulfa Antibiotics Hives and Other (See Comments)    "in my shoulders; legs; feet; made me burn all over"    Home Medications:  (Not in a hospital admission)  OB/GYN Status:  No LMP recorded. Patient is postmenopausal.  General Assessment Data Location of Assessment: [facility] ED TTS Assessment: In system Is this a Tele or Face-to-Face Assessment?: Tele Assessment ([name] would not allow tele-assessment machine in room) Is this an Initial Assessment or a Re-assessment for this encounter?: Initial Assessment Marital status: Other (comment) (UTA) Maiden name:  (UTA) Is  patient pregnant?: No Pregnancy Status: No Living Arrangements: Alone Can [name] return to current living arrangement?: Yes Admission Status: Voluntary Is patient capable of signing voluntary admission?: Yes Referral Source: Self/Family/Friend Insurance type: Medicaid     Crisis Care Plan Living Arrangements: Alone Legal Guardian: Other: (self) Name of Psychiatrist: NA Name of Therapist: NA  Education Status Is patient currently in school?: No Current Grade: UTA Highest grade of school patient has completed: [employer] Name of school:  NA Contact person: nA  Risk to self with the past 6 months Suicidal Ideation: Yes-Currently Present Has patient been a risk to self within the past 6 months prior to admission? : Other (comment) (UTA) Suicidal Intent: Yes-Currently Present Has patient had any suicidal intent within the past 6 months prior to admission? : Other (comment) (UTA) Is patient at risk for suicide?: Yes Suicidal Plan?: No Has patient had any suicidal plan within the past 6 months prior to admission? : Other (comment) (UTA) Access to Means: No What has been your use of drugs/alcohol within the last 12 months?: crack cocaine Previous Attempts/Gestures: No How many times?: 0 Other Self Harm Risks: SA Triggers for Past Attempts: None known Intentional Self Injurious Behavior: None Family Suicide History: No Recent stressful life event(s): Other (Comment) (UTA) Persecutory voices/beliefs?: No Depression: No Depression Symptoms:  (UTA) Substance abuse history and/or treatment for substance abuse?: Yes Suicide prevention information given to non-admitted patients: Not applicable  Risk to Others within the past 6 months Homicidal Ideation: No Does patient have any lifetime risk of violence toward others beyond the six months prior to admission? : Unknown Thoughts of Harm to Others: No Current Homicidal Intent: No Current Homicidal Plan: No Access to Homicidal Means: No Identified Victim: NA History of harm to others?: No Assessment of Violence: None Noted Violent Behavior Description: NA Does patient have access to weapons?: No Criminal Charges Pending?: No Does patient have a court date: No Is patient on probation?: Unknown  Psychosis Hallucinations:  (UTA) Delusions:  (UTA)  Mental Status Report Appearance/Hygiene: Unable to Assess Eye Contact: Unable to Assess Motor Activity: Unable to assess Speech: Unable to assess Level of Consciousness: Unable to assess Mood: Angry Affect: Unable to  Assess Anxiety Level: None Thought Processes: Unable to Assess Judgement: Unable to Assess Obsessive Compulsive Thoughts/Behaviors: Unable to Assess  Cognitive Functioning Memory: Unable to Assess IQ: Average Insight: Unable to Assess Impulse Control: Unable to Assess Appetite: Fair Weight Loss: 0 Weight Gain: 0 Sleep: Unable to Assess Total Hours of Sleep: 0 Vegetative Symptoms: Unable to Assess  ADLScreening [facility] Assessment Services) Patient's cognitive ability adequate to safely complete daily activities?: No Patient able to express need for assistance with ADLs?:  (UTA) Independently performs ADLs?: Yes (appropriate for developmental age)  Prior Inpatient Therapy Prior Inpatient Therapy: Yes Prior Therapy Dates: NA Prior Therapy Facilty/Provider(s): NA Reason for Treatment: NA  Prior Outpatient Therapy Prior Outpatient Therapy: Yes Prior Therapy Dates: UTA Prior Therapy Facilty/Provider(s): [facility] Reason for Treatment: Schizophrenia Does patient have an ACCT team?: No Does patient have Intensive In-House Services?  : No Does patient have [facility] services? : No Does patient have P4CC services?: No  ADL Screening (condition at time of admission) Patient's cognitive ability adequate to safely complete daily activities?: No Is the patient deaf or have difficulty hearing?:  (UTA) Does the patient have difficulty seeing, even when wearing glasses/contacts?:  (UTA) Does the patient have difficulty concentrating, remembering, or making decisions?: Yes Patient able to express need for assistance with ADLs?:  (  UTA) Does the patient have difficulty dressing or bathing?:  (UTA) Independently performs ADLs?: Yes (appropriate for developmental age) Does the patient have difficulty walking or climbing stairs?:  (UTA)       Abuse/Neglect Assessment (Assessment to be complete while patient is alone) Physical Abuse:  (UTA) Verbal Abuse:  (UTA) Sexual Abuse:   (UTA) Exploitation of patient/patient's resources:  Special educational needs teacher) Self-Neglect:  (UTA)     Advance Directives (For Healthcare) Does Patient Have a Medical Advance Directive?: No    Additional Information 1:1 In Past 12 Months?: No CIRT Risk: No Elopement Risk: No Does patient have medical clearance?: Yes     Disposition:  Disposition Initial Assessment Completed for this Encounter: Yes Disposition of Patient: Other dispositions (Recommends Dr. Lenna Sciara see the [name] for psych consult)  Rogenia Werntz D 11/13/2016 8:14 AM

## 2016-11-13 NOTE — ED Notes (Signed)
RN placed snack at bedside,

## 2016-11-13 NOTE — ED Notes (Signed)
Pt refusing tele assessment or an over the phone assessment, stating "I'm not going to talk to no psychiatrist, or anyone. I'm not going to [facility]."  [employer] with [facility].

## 2016-11-13 NOTE — ED Notes (Signed)
TTS was taken to patient room, Pt. Stated she was not going to talk to TTS, to remove it from her room. The Nurse was informed.

## 2016-11-13 NOTE — ED Notes (Signed)
Spoke with intake at [facility] advised Plan to have MD see patient in person tomorrow for assessment.

## 2016-12-09 ENCOUNTER — Ambulatory Visit: Payer: Medicaid Other | Admitting: Infectious Disease

## 2017-04-09 ENCOUNTER — Encounter (HOSPITAL_COMMUNITY): Payer: Self-pay | Admitting: Emergency Medicine

## 2017-04-09 ENCOUNTER — Ambulatory Visit: Payer: Medicaid Other

## 2017-04-09 ENCOUNTER — Telehealth: Payer: Self-pay | Admitting: Endocrinology

## 2017-04-09 ENCOUNTER — Ambulatory Visit (INDEPENDENT_AMBULATORY_CARE_PROVIDER_SITE_OTHER): Payer: Medicaid Other

## 2017-04-09 ENCOUNTER — Ambulatory Visit (HOSPITAL_COMMUNITY)
Admission: EM | Admit: 2017-04-09 | Discharge: 2017-04-09 | Disposition: A | Discharge status: Home / Self Care | Payer: Medicaid Other | Attending: Internal Medicine | Admitting: Internal Medicine

## 2017-04-09 DIAGNOSIS — N898 Other specified noninflammatory disorders of vagina: Secondary | ICD-10-CM | POA: Insufficient documentation

## 2017-04-09 DIAGNOSIS — S42124A Nondisplaced fracture of acromial process, right shoulder, initial encounter for closed fracture: Secondary | ICD-10-CM

## 2017-04-09 LAB — POCT URINALYSIS DIP (DEVICE)
Glucose, UA: 100 mg/dL — AB
Ketones, ur: NEGATIVE mg/dL
NITRITE: NEGATIVE
Protein, ur: >=300 mg/dL — AB
Specific Gravity, Urine: 1.025 (ref 1.005–1.030)
Urobilinogen, UA: 4 mg/dL — ABNORMAL HIGH (ref 0.0–1.0)
pH: 5.5 (ref 5.0–8.0)

## 2017-04-09 MED ORDER — ONDANSETRON 4 MG PO TBDP
ORAL_TABLET | ORAL | Status: AC
  Filled 2017-04-09: qty 2

## 2017-04-09 MED ORDER — AZITHROMYCIN 250 MG PO TABS
1000.0000 mg | ORAL_TABLET | Freq: Once | ORAL | Status: AC
  Administered 2017-04-09: 1000 mg via ORAL

## 2017-04-09 MED ORDER — FAMOTIDINE 20 MG PO TABS
20.0000 mg | ORAL_TABLET | Freq: Once | ORAL | Status: AC
  Administered 2017-04-09: 20 mg via ORAL

## 2017-04-09 MED ORDER — CEFTRIAXONE SODIUM 250 MG IJ SOLR
250.0000 mg | Freq: Once | INTRAMUSCULAR | Status: AC
  Administered 2017-04-09: 250 mg via INTRAMUSCULAR

## 2017-04-09 MED ORDER — METRONIDAZOLE 500 MG PO TABS
ORAL_TABLET | ORAL | Status: AC
  Filled 2017-04-09: qty 1

## 2017-04-09 MED ORDER — AZITHROMYCIN 250 MG PO TABS
ORAL_TABLET | ORAL | Status: AC
  Filled 2017-04-09: qty 4

## 2017-04-09 MED ORDER — METRONIDAZOLE 500 MG PO TABS
2000.0000 mg | ORAL_TABLET | Freq: Once | ORAL | Status: AC
  Administered 2017-04-09: 2000 mg via ORAL

## 2017-04-09 MED ORDER — ONDANSETRON 4 MG PO TBDP
8.0000 mg | ORAL_TABLET | Freq: Once | ORAL | Status: AC
  Administered 2017-04-09: 8 mg via ORAL

## 2017-04-09 MED ORDER — METRONIDAZOLE 500 MG PO TABS
ORAL_TABLET | ORAL | Status: AC
  Filled 2017-04-09: qty 4

## 2017-04-09 MED ORDER — CEFTRIAXONE SODIUM 250 MG IJ SOLR
INTRAMUSCULAR | Status: AC
  Filled 2017-04-09: qty 250

## 2017-04-09 NOTE — Progress Notes (Signed)
Pt walked into clinic stating that she was in pain and needed pain medication. Upon going up front and speaking to Pt she then told me that she's been having a vaginal discharge onset about 2 weeks ago and she feels like that's the cause of her abdominal and vaginal pain. I informed Pt that today we don't have any providers in clinic and she should seek medical care at an urgent clinic, she stated that she didn't want to go because she felt like the wait would be too long and it's hot outside. I told her the ED is located behind the clinic and if she felt like that would be a better option than she could go there and she then stated that she didn't feel like waiting there either and she wants to walk in and be seen right away. Pt stated that she's HIV positive and needs to have blood work done. I asked the Pt had she been taking her medications and directed and she stated yes. I walked Pt to the front desk to make an appointment to see provider as well as pharmacy and during our discussion she then told me that she hasn't been taking any medications at all. Pt hasn't been seen in clinic in over a year and has 5 no shows in the system. Per office policy Pt would need to come in for an appointment with pharmacy to restart her medication for her HIV until she could get in to see a provider and needs an appointment with a provider to follow up on her health. Pt was given an appointment to come in to see pharmacy next week and given bus passes to get her to and from appointment. 10 to 15 later Pts sister walked into the lobby to check in on her and began to state her grievances at that time. I made sure it was ok to continue the conversation and Pt stated I could in full detail and to explain to her what was going on. Pt's sister began to disclose that Pt hasn't been taking any medication and has been exhibiting risky behavior by smoking crack cocaine, having sex with "crackheads", not eating nor sleeping properly and  not taking care of her self overall. Sister stated that she was worried about Pt and began to cry. I  Explained the importance or returning to care to Pt and the importance of support to her sister. But stated they'd comply and stated they were on the way to urgent care. An appointment printout was given to Pt as she left the office.

## 2017-04-09 NOTE — Discharge Instructions (Signed)
For your vaginal discharge, you've been given metronidazole, azithromycin, ceftriaxone. I also been given Zofran, and Pepcid as well, and I recommend you eat something as soon as possible. You'll be notified of the results of your tests in 3-5 business days.  You have a fracture in one of the bones of your right shoulder, which placed your arm in a sling immobilizer, have provided the contact information for the orthopedist on call, contact his office in the morning to schedule for follow-up care. He may take over-the-counter Tylenol, ibuprofen, or Aleve as needed for pain management.

## 2017-04-09 NOTE — Telephone Encounter (Signed)
See message.

## 2017-04-09 NOTE — Medical Student Note (Signed)
[facility] Statistician Note For educational purposes for Medical, PA and NP students only and not part of the legal medical record.   CSN: [id number] Arrival date & time: 04/09/17  1458     History   Chief Complaint Chief Complaint  Patient presents with  . Groin Pain  . Shoulder Pain    HPI [name] is a 66 y.o. female.  HPI  Past Medical History:  Diagnosis Date  . Anxiety   . Arthritis   . Asthma   . Chronic bronchitis   . COPD (chronic obstructive pulmonary disease) ([place])   . Depression   . Fibromyalgia    "I think I got that"  . Headache(784.0)   . HIV (human immunodeficiency virus infection) ([place])    "dx'd ~ 12/2011"  . Lipoma    right forearm  . 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 Pneumonia ~ 2000's   "once"  . Shortness of breath on exertion   . Thyroid cancer s/p total thyroidectomy April2013 02/06/2012   Per patient report.  Recently diagnosed. Awaiting procedure to have thyroid removed.  Under treatment with Dr Janace Hoard .   Marland Kitchen Vaginal discharge 12/19/2015    Patient Active Problem List   Diagnosis Date Noted  . Psychoactive substance-induced mood disorder ([place]) 11/13/2016  . Vaginal discharge 12/19/2015  . 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 Pain in joint, shoulder region 08/08/2013  . 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 Rib fractures 07/07/2012  . Weight loss 07/07/2012  . Cervical lymphadenopathy 07/07/2012  . Lymphadenopathy 07/07/2012  . Syncope 05/09/2012  . Neck pain, musculoskeletal 05/09/2012  . Pain in joint of right elbow, possible small fracture 05/09/2012  . Tobacco abuse 05/09/2012  . 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 HIV (human immunodeficiency virus infection) ([place]) 02/06/2012  . LOOSE BODY IN KNEE 10/01/2007  . FRACTURE, TOE, RIGHT 10/01/2007  . ANKLE SPRAIN, RIGHT 10/01/2007  . OSTEOCHONDRITIS DISSECANS  06/09/2007  . KNEE PAIN 05/24/2007  . LIPOMA 01/28/2007  . Major depressive disorder, recurrent episode ([place]) 01/28/2007  . Anxiety state 01/28/2007  . TENSION HEADACHE 01/28/2007  . BRONCHITIS, CHRONIC 01/28/2007  . MENOPAUSAL SYNDROME 01/28/2007  . Enthesopathy of hip region 01/28/2007  . PARESTHESIA NOS 01/28/2007    Past Surgical History:  Procedure Laterality Date  . ANTERIOR CERVICAL DECOMP/DISCECTOMY FUSION  05/2009   C3-4; C5-6  . DILATION AND CURETTAGE OF UTERUS    . THYROIDECTOMY  03/11/12   bilaterally  . THYROIDECTOMY  03/11/2012   Procedure: THYROIDECTOMY;  Surgeon: Melissa Montane, MD;  Location: [facility];  Service: ENT;  Laterality: Bilateral;    OB History    No data available       Home Medications    Prior to Admission medications   Medication Sig Start Date End Date Taking? Authorizing Provider  atazanavir (REYATAZ) 300 MG capsule Take 1 capsule (300 mg total) by mouth daily with breakfast. 02/11/16  Yes Tommy Medal, Lavell Islam, MD  emtricitabine-tenofovir (TRUVADA) 200-300 MG tablet Take 1 tablet by mouth daily. 02/11/16  Yes Tommy Medal, Lavell Islam, MD  levothyroxine (SYNTHROID, LEVOTHROID) 150 MCG tablet Take 1 tablet (150 mcg total) by mouth daily. 11/13/16  Yes Duffy Bruce, MD  ritonavir (NORVIR) 100 MG TABS tablet Take 1 tablet (100 mg total) by mouth daily. 02/11/16  Yes Tommy Medal, Lavell Islam, MD  albuterol (PROVENTIL HFA;VENTOLIN HFA) 108 (90 BASE) MCG/ACT  inhaler Inhale 2 puffs into the lungs every 4 (four) hours as needed for wheezing or shortness of breath. 08/10/14   Ward, Delice Bison, DO  cephALEXin (KEFLEX) 500 MG capsule Take 1 capsule (500 mg total) by mouth 2 (two) times daily. Patient not taking: Reported on 11/12/2016 06/05/15   Glendell Docker, NP  citalopram (CELEXA) 20 MG tablet Take 1 tablet (20 mg total) by mouth daily. 02/11/16 02/10/17  Truman Hayward, MD  ENSURE (ENSURE) Take 237 mLs by mouth 3 (three) times daily between meals. 12/19/15   Truman Hayward, MD  gabapentin (NEURONTIN) 300 MG capsule TAKE 1 CAPSULE BY MOUTH 3 TIMES DAILY. 07/31/16   Truman Hayward, MD  hydrOXYzine (ATARAX/VISTARIL) 25 MG tablet Take 1 tablet (25 mg total) by mouth every 8 (eight) hours as needed for itching. 04/27/14   Tommy Medal, Lavell Islam, MD  ibuprofen (ADVIL,MOTRIN) 200 MG tablet Take 400 mg by mouth every 6 (six) hours as needed for mild pain or moderate pain.    [provider]  megestrol (MEGACE ES) 625 MG/5ML suspension Take 5 mLs (625 mg total) by mouth daily. 12/19/15   Truman Hayward, MD  metroNIDAZOLE (FLAGYL) 500 MG tablet Take 1 tablet (500 mg total) by mouth 2 (two) times daily. Patient not taking: Reported on 11/12/2016 12/19/15   Tommy Medal, Lavell Islam, MD    Family History Family History  Problem Relation Age of Onset  . Heart disease Maternal Uncle     Social History Social History  Substance Use Topics  . Smoking status: Current Every Day Smoker    Packs/day: 1.00    Years: 47.00    Types: Cigarettes  . Smokeless tobacco: Former Systems developer    Quit date: 12/01/1974     Comment: currently smokes   . Alcohol use No     Comment: 03/11/12 "no beer or wine ~ 01/2012"     Allergies   Sulfa antibiotics   Review of Systems Review of Systems   Physical Exam Updated Vital Signs BP 90/67 (BP Location: Left Arm)   Pulse 69   Temp 98.4 F (36.9 C) (Oral)   Resp 16   SpO2 99%   Physical Exam  Musculoskeletal: She exhibits edema and tenderness.       Right shoulder: She exhibits decreased range of motion, tenderness, bony tenderness and swelling.       Arms:    ED Treatments / Results  Labs (all labs ordered are listed, but only abnormal results are displayed) Labs Reviewed  POCT URINALYSIS DIP (DEVICE) - Abnormal; Notable for the following:       Result Value   Glucose, UA 100 (*)    Bilirubin Urine SMALL (*)    Hgb urine dipstick SMALL (*)    Protein, ur >=300 (*)    Urobilinogen, UA 4.0 (*)     Leukocytes, UA TRACE (*)    All other components within normal limits  CERVICOVAGINAL ANCILLARY ONLY    EKG  EKG Interpretation None       Radiology No results found.  Procedures Procedures (including critical care time)  Medications Ordered in ED Medications - No data to display   Initial Impression / Assessment and Plan / ED Course  I have reviewed the triage vital signs and the nursing notes.  Pertinent labs & imaging results that were available during my care of the patient were reviewed by me and considered in my medical decision making (see  chart for details).       Final Clinical Impressions(s) / ED Diagnoses   Final diagnoses:  None    New Prescriptions New Prescriptions   No medications on file
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
## 2017-04-09 NOTE — Telephone Encounter (Signed)
This patient came by, extremely disoriented requesting to be seen. I informed her that she has not been seen here since 2014 and that we would have to get the OK to see her.  She kept telling me that she "is going to die if she doesn't get her medication."  She said she has to have her medication sent to her pharmacy and that she has to be seen because it was against the law for [place] not to see her and that she has been "blanking out" a lot and cannot gain any weight.  She informed me that she does not have any type of contact phone number where we can reach her.  I told her that I would just have to send a message back to Dr. Loanne Drilling due to him being off this afternoon.   Please advise on how to proceed.
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
## 2017-04-09 NOTE — Telephone Encounter (Signed)
Go to ER now
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
## 2017-04-10 LAB — CERVICOVAGINAL ANCILLARY ONLY
Bacterial vaginitis: POSITIVE — AB
Candida vaginitis: NEGATIVE
Chlamydia: NEGATIVE
NEISSERIA GONORRHEA: NEGATIVE
Trichomonas: POSITIVE — AB

## 2017-04-10 NOTE — Telephone Encounter (Signed)
Left a vm letting patient know to go to the ER ASAP

## 2017-04-11 LAB — URINE CULTURE

## 2017-04-13 ENCOUNTER — Ambulatory Visit (INDEPENDENT_AMBULATORY_CARE_PROVIDER_SITE_OTHER): Payer: Medicaid Other | Admitting: Pharmacist Clinician (PhC)/ Clinical Pharmacy Specialist

## 2017-04-13 ENCOUNTER — Ambulatory Visit (INDEPENDENT_AMBULATORY_CARE_PROVIDER_SITE_OTHER): Payer: Medicaid Other | Admitting: Licensed Clinical Social Worker

## 2017-04-13 DIAGNOSIS — F325 Major depressive disorder, single episode, in full remission: Secondary | ICD-10-CM

## 2017-04-13 DIAGNOSIS — B20 Human immunodeficiency virus [HIV] disease: Secondary | ICD-10-CM

## 2017-04-13 DIAGNOSIS — L299 Pruritus, unspecified: Secondary | ICD-10-CM

## 2017-04-13 DIAGNOSIS — C73 Malignant neoplasm of thyroid gland: Secondary | ICD-10-CM | POA: Diagnosis not present

## 2017-04-13 DIAGNOSIS — F321 Major depressive disorder, single episode, moderate: Secondary | ICD-10-CM

## 2017-04-13 DIAGNOSIS — F330 Major depressive disorder, recurrent, mild: Secondary | ICD-10-CM | POA: Diagnosis not present

## 2017-04-13 LAB — TSH: TSH: 145.09 mIU/L — ABNORMAL HIGH

## 2017-04-13 MED ORDER — MEGESTROL ACETATE 20 MG PO TABS
20.0000 mg | ORAL_TABLET | Freq: Every day | ORAL | 2 refills | Status: DC
Start: 2017-04-13 — End: 2017-05-14

## 2017-04-13 MED ORDER — CITALOPRAM HYDROBROMIDE 20 MG PO TABS: 20.0000 mg | ORAL_TABLET | Freq: Every day | ORAL | 5 refills | Status: DC

## 2017-04-13 MED ORDER — ENSURE PO LIQD: 237.0000 mL | Freq: Two times a day (BID) | ORAL | 6 refills | Status: DC

## 2017-04-13 MED ORDER — GABAPENTIN 300 MG PO CAPS: 300.0000 mg | ORAL_CAPSULE | Freq: Three times a day (TID) | ORAL | 2 refills | Status: DC

## 2017-04-13 MED ORDER — LEVOTHYROXINE SODIUM 150 MCG PO TABS: 150.0000 ug | ORAL_TABLET | Freq: Every day | ORAL | 2 refills | Status: DC

## 2017-04-13 MED ORDER — HYDROXYZINE HCL 25 MG PO TABS: 25.0000 mg | ORAL_TABLET | Freq: Three times a day (TID) | ORAL | 2 refills | Status: DC | PRN
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
## 2017-04-14 ENCOUNTER — Other Ambulatory Visit: Payer: Self-pay | Admitting: Infectious Disease

## 2017-04-14 ENCOUNTER — Telehealth: Payer: Self-pay | Admitting: Infectious Disease

## 2017-04-14 LAB — COMPLETE METABOLIC PANEL WITH GFR
ALT: 16 U/L (ref 6–29)
AST: 34 U/L (ref 10–35)
Albumin: 4.3 g/dL (ref 3.6–5.1)
Alkaline Phosphatase: 46 U/L (ref 33–130)
BILIRUBIN TOTAL: 1.5 mg/dL — AB (ref 0.2–1.2)
BUN: 22 mg/dL (ref 7–25)
CALCIUM: 9.2 mg/dL (ref 8.6–10.4)
CHLORIDE: 107 mmol/L (ref 98–110)
CO2: 20 mmol/L (ref 20–31)
Creat: 1.73 mg/dL — ABNORMAL HIGH (ref 0.50–0.99)
GFR, EST AFRICAN AMERICAN: 35 mL/min — AB (ref >=60)
GFR, Est Non African American: 31 mL/min — ABNORMAL LOW (ref >=60)
Glucose, Bld: 88 mg/dL (ref 65–99)
Potassium: 3.8 mmol/L (ref 3.5–5.3)
Sodium: 142 mmol/L (ref 135–146)
TOTAL PROTEIN: 7.7 g/dL (ref 6.1–8.1)

## 2017-04-14 LAB — LIPID PANEL
CHOL/HDL RATIO: 5.1 ratio — AB (ref <5.0)
Cholesterol: 236 mg/dL — ABNORMAL HIGH (ref <200)
HDL: 46 mg/dL — AB (ref >50)
LDL CALC: 143 mg/dL — AB (ref <100)
Triglycerides: 233 mg/dL — ABNORMAL HIGH (ref <150)
VLDL: 47 mg/dL — ABNORMAL HIGH (ref <30)

## 2017-04-14 LAB — T-HELPER CELL (CD4) - (RCID CLINIC ONLY)
CD4 % Helper T Cell: 50 % (ref 33–55)
CD4 T Cell Abs: 1010 /uL (ref 400–2700)

## 2017-04-14 LAB — RPR

## 2017-04-14 NOTE — Telephone Encounter (Signed)
x

## 2017-04-16 LAB — HIV-1 RNA,QN PCR W/REFLEX GENOTYPE
HIV-1 RNA, QN PCR: 1.32 {Log_copies}/mL — AB
HIV-1 RNA, QN PCR: 21 {copies}/mL — AB

## 2017-04-22 ENCOUNTER — Telehealth (HOSPITAL_COMMUNITY): Payer: Self-pay | Admitting: Emergency Medicine

## 2017-04-22 NOTE — Telephone Encounter (Signed)
Patient and friend on the phone.  Patient reports someone called to give results.  Reviewed medical record.  Dr Lucianne Lei dam called patient per medical record, but no note in system.  Gave patient and friend the number for dr Lucianne Lei dam location at [facility] [place] location

## 2017-04-24 ENCOUNTER — Telehealth (HOSPITAL_COMMUNITY): Payer: Self-pay | Admitting: *Deleted

## 2017-04-24 ENCOUNTER — Emergency Department (HOSPITAL_COMMUNITY)
Admission: EM | Admit: 2017-04-24 | Discharge: 2017-04-24 | Disposition: A | Discharge status: Home / Self Care | Payer: Medicaid Other | Attending: Emergency Medicine | Admitting: Emergency Medicine

## 2017-04-24 ENCOUNTER — Encounter (HOSPITAL_COMMUNITY): Payer: Self-pay | Admitting: Emergency Medicine

## 2017-04-24 DIAGNOSIS — Y939 Activity, unspecified: Secondary | ICD-10-CM | POA: Insufficient documentation

## 2017-04-24 DIAGNOSIS — E890 Postprocedural hypothyroidism: Secondary | ICD-10-CM | POA: Diagnosis not present

## 2017-04-24 DIAGNOSIS — Z760 Encounter for issue of repeat prescription: Secondary | ICD-10-CM | POA: Diagnosis not present

## 2017-04-24 DIAGNOSIS — M25511 Pain in right shoulder: Secondary | ICD-10-CM | POA: Diagnosis present

## 2017-04-24 DIAGNOSIS — F17210 Nicotine dependence, cigarettes, uncomplicated: Secondary | ICD-10-CM | POA: Diagnosis not present

## 2017-04-24 DIAGNOSIS — S42124D Nondisplaced fracture of acromial process, right shoulder, subsequent encounter for fracture with routine healing: Secondary | ICD-10-CM | POA: Diagnosis not present

## 2017-04-24 DIAGNOSIS — Z85850 Personal history of malignant neoplasm of thyroid: Secondary | ICD-10-CM | POA: Insufficient documentation

## 2017-04-24 DIAGNOSIS — J45909 Unspecified asthma, uncomplicated: Secondary | ICD-10-CM | POA: Diagnosis not present

## 2017-04-24 DIAGNOSIS — Y929 Unspecified place or not applicable: Secondary | ICD-10-CM | POA: Insufficient documentation

## 2017-04-24 DIAGNOSIS — B20 Human immunodeficiency virus [HIV] disease: Secondary | ICD-10-CM

## 2017-04-24 DIAGNOSIS — Y999 Unspecified external cause status: Secondary | ICD-10-CM | POA: Insufficient documentation

## 2017-04-24 DIAGNOSIS — J449 Chronic obstructive pulmonary disease, unspecified: Secondary | ICD-10-CM | POA: Insufficient documentation

## 2017-04-24 DIAGNOSIS — Z79899 Other long term (current) drug therapy: Secondary | ICD-10-CM | POA: Insufficient documentation

## 2017-04-24 LAB — COMPREHENSIVE METABOLIC PANEL
ALK PHOS: 56 U/L (ref 38–126)
ALT: 28 U/L (ref 14–54)
ANION GAP: 8 (ref 5–15)
AST: 41 U/L (ref 15–41)
Albumin: 3.6 g/dL (ref 3.5–5.0)
BILIRUBIN TOTAL: 1.2 mg/dL (ref 0.3–1.2)
BUN: 15 mg/dL (ref 6–20)
CALCIUM: 8.5 mg/dL — AB (ref 8.9–10.3)
CO2: 22 mmol/L (ref 22–32)
Chloride: 110 mmol/L (ref 101–111)
Creatinine, Ser: 1.68 mg/dL — ABNORMAL HIGH (ref 0.44–1.00)
GFR calc Af Amer: 36 mL/min — ABNORMAL LOW (ref >60)
GFR calc non Af Amer: 31 mL/min — ABNORMAL LOW (ref >60)
Glucose, Bld: 89 mg/dL (ref 65–99)
Potassium: 3.7 mmol/L (ref 3.5–5.1)
SODIUM: 140 mmol/L (ref 135–145)
Total Protein: 6.9 g/dL (ref 6.5–8.1)

## 2017-04-24 LAB — LIPASE, BLOOD: Lipase: 58 U/L — ABNORMAL HIGH (ref 11–51)

## 2017-04-24 LAB — CBC
HCT: 29.5 % — ABNORMAL LOW (ref 36.0–46.0)
Hemoglobin: 9.6 g/dL — ABNORMAL LOW (ref 12.0–15.0)
MCH: 31.3 pg (ref 26.0–34.0)
MCHC: 32.5 g/dL (ref 30.0–36.0)
MCV: 96.1 fL (ref 78.0–100.0)
PLATELETS: 272 10*3/uL (ref 150–400)
RBC: 3.07 MIL/uL — AB (ref 3.87–5.11)
RDW: 15.4 % (ref 11.5–15.5)
WBC: 5.5 10*3/uL (ref 4.0–10.5)

## 2017-04-24 MED ORDER — METHOCARBAMOL 500 MG PO TABS: 500.0000 mg | ORAL_TABLET | Freq: Two times a day (BID) | ORAL | 0 refills | Status: AC

## 2017-04-24 MED ORDER — ALBUTEROL SULFATE HFA 108 (90 BASE) MCG/ACT IN AERS: 2.0000 | INHALATION_SPRAY | RESPIRATORY_TRACT | 0 refills | Status: DC | PRN

## 2017-04-24 MED ORDER — ACETAMINOPHEN 500 MG PO TABS: 1000.0000 mg | ORAL_TABLET | Freq: Once | ORAL | Status: DC

## 2017-04-24 NOTE — Discharge Instructions (Signed)
Please follow-up with your primary care provider regarding your prescription medicine and refills.
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
## 2017-04-24 NOTE — ED Notes (Signed)
Pt approached desk at shift change demanding discharge paperwork; pt  Was asked to wait in room for a few minutes while other pt information was being discussed and on coming RN would be in to discharge; Pt states she can no longer wait and was seen leaving the Pod towards exit; Pt was asked several times to allow a few minutes to receive paperwork and order pain med; Pt did not want to wait
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
## 2017-04-24 NOTE — Telephone Encounter (Signed)
patient came to urgent care this afternoon, unsure what the patient needed or wanted after talking with her for several minutes. she stated that she didn't want to check in and was asking why other clinics knew she had been here. i explained our computer system. Patient stated she needed pain medication for her shoulder. Patient with sling but using crutch with the arm that is injured. Patient very hard to understand. Urgent care attempted to assist patient with her needs.

## 2017-04-28 ENCOUNTER — Ambulatory Visit: Payer: Self-pay | Admitting: Licensed Clinical Social Worker

## 2017-04-28 ENCOUNTER — Ambulatory Visit: Payer: Self-pay

## 2017-04-29 ENCOUNTER — Telehealth: Payer: Self-pay | Admitting: Licensed Clinical Social Worker

## 2017-04-29 NOTE — Telephone Encounter (Signed)
Patient was scheduled to meet with [facility] after Rx appointment on Tuesday 29th and patient did not present to appt.  Called and left message asking for patient to call office to reschedule appointments.

## 2017-05-12 NOTE — Progress Notes (Signed)
  Name: [name]  MRN: [id number]   Date: 04/13/17  Time started: 10:15 am    Time ended: 10:25 am  Behavior: Friendly and cooperative Mood: Indifferent Affect: Sad Thought Process: Flight of ideas Thought Content: Logical Thought Disorder and Perceptual Anomalies: None observed or self-reported S/I and H/I: Denied current suicidal ideations, plan, and intent   Referral Source: Orland Mustard, RN  Content of Session: Met with patient briefly based on referral, due to patient's substance use and possible mood disorder symptoms.  Intervention Provided: Attempted to perform assessment of current symptoms but was limited by patient.  Performed suicide assessment.  Provided patient with bus passes to attend next appointment.  Response: Patient reiterated that she cannot stay for full assessment due to her transportation waiting for her in the lobby, but met briefly with the [facility].  Patient presented with flight of ideas and rapidly switched topics.  Patient stated, "I was feeling like giving up" but denied current suicidal ideations or plan because she is going to be a great-grandmother.  Patient stated that she desired a hot cooked meal but denied lack of access to food.  Patient stated that she has food at home and has been eating, but desires other types of food.  She explained that she applied for Meals on Wheels but was never contacted.  Plan: Patient has Pharmacy appointment scheduled for May 29 and was willing to also meet with [facility] at that time.  Patient was provided with two bus passes for transportation to and from the appointment.  [facility] will perform mental health, substance use, and suicide assessment at May 29th appointment.  Sande Rives, [facility]

## 2017-05-14 ENCOUNTER — Other Ambulatory Visit: Payer: Self-pay | Admitting: Pharmacist Clinician (PhC)/ Clinical Pharmacy Specialist

## 2017-05-14 ENCOUNTER — Ambulatory Visit (INDEPENDENT_AMBULATORY_CARE_PROVIDER_SITE_OTHER): Payer: Medicaid Other | Admitting: Pharmacist Clinician (PhC)/ Clinical Pharmacy Specialist

## 2017-05-14 DIAGNOSIS — B20 Human immunodeficiency virus [HIV] disease: Secondary | ICD-10-CM | POA: Diagnosis not present

## 2017-05-14 MED ORDER — EMTRICITABINE-TENOFOVIR AF 200-25 MG PO TABS: 1.0000 | ORAL_TABLET | Freq: Every day | ORAL | 5 refills | Status: DC

## 2017-05-14 MED ORDER — GABAPENTIN 300 MG PO CAPS: 300.0000 mg | ORAL_CAPSULE | Freq: Three times a day (TID) | ORAL | 2 refills | Status: DC

## 2017-05-14 MED ORDER — MEGESTROL ACETATE 20 MG PO TABS: 20.0000 mg | ORAL_TABLET | Freq: Every day | ORAL | 2 refills | Status: DC

## 2017-05-14 MED ORDER — ATAZANAVIR-COBICISTAT 300-150 MG PO TABS: 1.0000 | ORAL_TABLET | Freq: Every day | ORAL | 5 refills | Status: DC
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
## 2017-05-26 ENCOUNTER — Emergency Department (HOSPITAL_COMMUNITY)
Admission: EM | Admit: 2017-05-26 | Discharge: 2017-05-27 | Disposition: A | Discharge status: Home / Self Care | Payer: Medicaid Other | Attending: Emergency Medicine | Admitting: Emergency Medicine

## 2017-05-26 ENCOUNTER — Encounter (HOSPITAL_COMMUNITY): Payer: Self-pay | Admitting: Pharmacy Technician

## 2017-05-26 DIAGNOSIS — M79662 Pain in left lower leg: Secondary | ICD-10-CM | POA: Diagnosis not present

## 2017-05-26 DIAGNOSIS — J449 Chronic obstructive pulmonary disease, unspecified: Secondary | ICD-10-CM | POA: Diagnosis not present

## 2017-05-26 DIAGNOSIS — M79604 Pain in right leg: Secondary | ICD-10-CM

## 2017-05-26 DIAGNOSIS — E039 Hypothyroidism, unspecified: Secondary | ICD-10-CM | POA: Diagnosis not present

## 2017-05-26 DIAGNOSIS — F17210 Nicotine dependence, cigarettes, uncomplicated: Secondary | ICD-10-CM | POA: Diagnosis not present

## 2017-05-26 DIAGNOSIS — Z85850 Personal history of malignant neoplasm of thyroid: Secondary | ICD-10-CM | POA: Diagnosis not present

## 2017-05-26 DIAGNOSIS — M79661 Pain in right lower leg: Secondary | ICD-10-CM | POA: Insufficient documentation

## 2017-05-26 DIAGNOSIS — M79605 Pain in left leg: Secondary | ICD-10-CM

## 2017-05-26 LAB — URINALYSIS, ROUTINE W REFLEX MICROSCOPIC
Bilirubin Urine: NEGATIVE
GLUCOSE, UA: NEGATIVE mg/dL
HGB URINE DIPSTICK: NEGATIVE
KETONES UR: NEGATIVE mg/dL
LEUKOCYTES UA: NEGATIVE
Nitrite: NEGATIVE
PROTEIN: NEGATIVE mg/dL
Specific Gravity, Urine: 1.014 (ref 1.005–1.030)
pH: 5 (ref 5.0–8.0)

## 2017-05-26 LAB — I-STAT CHEM 8, ED
BUN: 20 mg/dL (ref 6–20)
CREATININE: 1.7 mg/dL — AB (ref 0.44–1.00)
Calcium, Ion: 1.15 mmol/L (ref 1.15–1.40)
Chloride: 104 mmol/L (ref 101–111)
Glucose, Bld: 83 mg/dL (ref 65–99)
HEMATOCRIT: 31 % — AB (ref 36.0–46.0)
Hemoglobin: 10.5 g/dL — ABNORMAL LOW (ref 12.0–15.0)
POTASSIUM: 4.1 mmol/L (ref 3.5–5.1)
Sodium: 138 mmol/L (ref 135–145)
TCO2: 25 mmol/L (ref 0–100)

## 2017-05-26 LAB — COMPREHENSIVE METABOLIC PANEL
ALBUMIN: 3.8 g/dL (ref 3.5–5.0)
ALK PHOS: 55 U/L (ref 38–126)
ALT: 16 U/L (ref 14–54)
ANION GAP: 7 (ref 5–15)
AST: 26 U/L (ref 15–41)
BILIRUBIN TOTAL: 0.6 mg/dL (ref 0.3–1.2)
BUN: 17 mg/dL (ref 6–20)
CALCIUM: 9.3 mg/dL (ref 8.9–10.3)
CO2: 25 mmol/L (ref 22–32)
Chloride: 104 mmol/L (ref 101–111)
Creatinine, Ser: 1.64 mg/dL — ABNORMAL HIGH (ref 0.44–1.00)
GFR calc non Af Amer: 32 mL/min — ABNORMAL LOW (ref >60)
GFR, EST AFRICAN AMERICAN: 37 mL/min — AB (ref >60)
GLUCOSE: 94 mg/dL (ref 65–99)
POTASSIUM: 4.1 mmol/L (ref 3.5–5.1)
Sodium: 136 mmol/L (ref 135–145)
TOTAL PROTEIN: 7.7 g/dL (ref 6.5–8.1)

## 2017-05-26 LAB — CBC WITH DIFFERENTIAL/PLATELET
BASOS PCT: 1 %
Basophils Absolute: 0.1 10*3/uL (ref 0.0–0.1)
Eosinophils Absolute: 0.1 10*3/uL (ref 0.0–0.7)
Eosinophils Relative: 2 %
HEMATOCRIT: 33 % — AB (ref 36.0–46.0)
HEMOGLOBIN: 10.5 g/dL — AB (ref 12.0–15.0)
LYMPHS ABS: 1.4 10*3/uL (ref 0.7–4.0)
LYMPHS PCT: 34 %
MCH: 30.9 pg (ref 26.0–34.0)
MCHC: 31.8 g/dL (ref 30.0–36.0)
MCV: 97.1 fL (ref 78.0–100.0)
MONO ABS: 0.2 10*3/uL (ref 0.1–1.0)
MONOS PCT: 5 %
NEUTROS ABS: 2.3 10*3/uL (ref 1.7–7.7)
NEUTROS PCT: 58 %
Platelets: 371 10*3/uL (ref 150–400)
RBC: 3.4 MIL/uL — ABNORMAL LOW (ref 3.87–5.11)
RDW: 14.8 % (ref 11.5–15.5)
WBC: 4 10*3/uL (ref 4.0–10.5)

## 2017-05-26 LAB — CK: CK TOTAL: 511 U/L — AB (ref 38–234)

## 2017-05-26 MED ORDER — SODIUM CHLORIDE 0.9 % IV BOLUS (SEPSIS)
1000.0000 mL | Freq: Once | INTRAVENOUS | Status: AC
  Administered 2017-05-26: 1000 mL via INTRAVENOUS

## 2017-05-26 NOTE — ED Notes (Signed)
Pt's sister took home meds and purse

## 2017-05-26 NOTE — ED Provider Notes (Signed)
[facility] DEPT Provider Note   CSN: [id number] Arrival date & time: 05/26/17  1930     History   Chief Complaint Chief Complaint  Patient presents with  . Foot Pain  . Back Pain    HPI [name] is a 66 y.o. female.  66 yo F with a significant past medical history of HIV comes in with a chief complaint of diffuse body aches. Going on for the past year or so. Normally needs assistance with ambulation with crutch that she has From the prior visit. Now feels that she is unable to ambulate at all. She is a poor historian difficult to verbalize her symptoms. States that she has bone pain all over. Denies injury. Complaining of pain all over but worse in her bilateral lower extremities and hamstrings. She denies tick exposure. Denies fevers or chills.   The history is provided by the patient.  Foot Pain  Pertinent negatives include no chest pain, no headaches and no shortness of breath.  Back Pain   Pertinent negatives include no chest pain, no fever, no headaches and no dysuria.  Illness  This is a new problem. The current episode started more than 1 week ago. The problem occurs constantly. The problem has been gradually worsening. Pertinent negatives include no chest pain, no headaches and no shortness of breath. Nothing aggravates the symptoms. Nothing relieves the symptoms. She has tried nothing for the symptoms. The treatment provided no relief.    Past Medical History:  Diagnosis Date  . Anxiety   . Arthritis   . Asthma   . Chronic bronchitis   . COPD (chronic obstructive pulmonary disease) ([place])   . Depression   . Fibromyalgia    "I think I got that"  . Headache(784.0)   . HIV (human immunodeficiency virus infection) ([place])    "dx'd ~ 12/2011"  . Lipoma    right forearm  . 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 Pneumonia ~ 2000's   "once"  . Shortness of breath on exertion   . Thyroid cancer s/p total  thyroidectomy April2013 02/06/2012   Per patient report.  Recently diagnosed. Awaiting procedure to have thyroid removed.  Under treatment with Dr Janace Hoard .   Marland Kitchen Vaginal discharge 12/19/2015    Patient Active Problem List   Diagnosis Date Noted  . Psychoactive substance-induced mood disorder ([place]) 11/13/2016  . Vaginal discharge 12/19/2015  . 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 Pain in joint, shoulder region 08/08/2013  . 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 Rib fractures 07/07/2012  . Weight loss 07/07/2012  . Cervical lymphadenopathy 07/07/2012  . Lymphadenopathy 07/07/2012  . Syncope 05/09/2012  . Neck pain, musculoskeletal 05/09/2012  . Pain in joint of right elbow, possible small fracture 05/09/2012  . Tobacco abuse 05/09/2012  . 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 HIV (human immunodeficiency virus infection) ([place]) 02/06/2012  . LOOSE BODY IN KNEE 10/01/2007  . FRACTURE, TOE, RIGHT 10/01/2007  . ANKLE SPRAIN, RIGHT 10/01/2007  . OSTEOCHONDRITIS DISSECANS 06/09/2007  . KNEE PAIN 05/24/2007  . LIPOMA 01/28/2007  . Major depressive disorder, recurrent episode ([place]) 01/28/2007  . Anxiety state 01/28/2007  . TENSION HEADACHE 01/28/2007  . BRONCHITIS, CHRONIC 01/28/2007  . MENOPAUSAL SYNDROME 01/28/2007  . Enthesopathy of hip region 01/28/2007  . PARESTHESIA NOS 01/28/2007    Past Surgical History:  Procedure Laterality Date  . ANTERIOR CERVICAL DECOMP/DISCECTOMY FUSION  05/2009   C3-4;  C5-6  . DILATION AND CURETTAGE OF UTERUS    . THYROIDECTOMY  03/11/12   bilaterally  . THYROIDECTOMY  03/11/2012   Procedure: THYROIDECTOMY;  Surgeon: Melissa Montane, MD;  Location: [facility];  Service: ENT;  Laterality: Bilateral;    OB History    No data available       Home Medications    Prior to Admission medications   Medication Sig Start Date End Date Taking? Authorizing Provider  albuterol (PROVENTIL HFA;VENTOLIN HFA) 108 (90 Base) MCG/ACT inhaler  Inhale 2 puffs into the lungs every 4 (four) hours as needed for wheezing or shortness of breath. 04/24/17  Yes Cardama, Grayce Sessions, MD  atazanavir-cobicistat (EVOTAZ) 300-150 MG tablet Take 1 tablet by mouth daily with breakfast. Swallow whole. Do NOT crush, cut or chew tablet. Take with food. 05/14/17  Yes Tommy Medal, Lavell Islam, MD  citalopram (CELEXA) 20 MG tablet Take 1 tablet (20 mg total) by mouth daily. 04/13/17 04/13/18 Yes Truman Hayward, MD  emtricitabine-tenofovir AF (DESCOVY) 200-25 MG tablet Take 1 tablet by mouth daily. 05/14/17  Yes Tommy Medal, Lavell Islam, MD  ENSURE (ENSURE) Take 237 mLs by mouth 2 (two) times daily between meals. 04/13/17  Yes Pham, Minh Q, RPH  gabapentin (NEURONTIN) 300 MG capsule Take 1 capsule (300 mg total) by mouth 3 (three) times daily. 05/14/17  Yes Tommy Medal, Lavell Islam, MD  hydrOXYzine (ATARAX/VISTARIL) 25 MG tablet Take 1 tablet (25 mg total) by mouth every 8 (eight) hours as needed for itching. 04/13/17  Yes Tommy Medal, Lavell Islam, MD  ibuprofen (ADVIL,MOTRIN) 200 MG tablet Take 400 mg by mouth every 6 (six) hours as needed for mild pain or moderate pain.   Yes [provider]  levothyroxine (SYNTHROID, LEVOTHROID) 150 MCG tablet Take 1 tablet (150 mcg total) by mouth daily. 04/13/17  Yes Tommy Medal, Lavell Islam, MD  megestrol (MEGACE) 20 MG tablet Take 1 tablet (20 mg total) by mouth daily. 05/14/17  Yes Tommy Medal, Lavell Islam, MD    Family History Family History  Problem Relation Age of Onset  . Heart disease Maternal Uncle     Social History Social History  Substance Use Topics  . Smoking status: Current Every Day Smoker    Packs/day: 1.00    Years: 47.00    Types: Cigarettes  . Smokeless tobacco: Former Systems developer    Quit date: 12/01/1974     Comment: currently smokes   . Alcohol use No     Comment: 03/11/12 "no beer or wine ~ 01/2012"     Allergies   Sulfa antibiotics   Review of Systems Review of Systems  Constitutional: Negative for  chills and fever.  HENT: Negative for congestion and rhinorrhea.   Eyes: Negative for redness and visual disturbance.  Respiratory: Negative for shortness of breath and wheezing.   Cardiovascular: Negative for chest pain and palpitations.  Gastrointestinal: Negative for nausea and vomiting.  Genitourinary: Negative for dysuria and urgency.  Musculoskeletal: Positive for arthralgias, back pain and myalgias.  Skin: Negative for pallor and wound.  Neurological: Negative for dizziness and headaches.     Physical Exam Updated Vital Signs BP 111/82   Pulse 73   Temp 98.8 F (37.1 C) (Oral)   Resp 18   SpO2 95%   Physical Exam  Constitutional: She is oriented to person, place, and time. She appears cachectic. No distress.  HENT:  Head: Normocephalic and atraumatic.  Eyes: EOM are normal. Pupils are equal, round, and  reactive to light.  Neck: Normal range of motion. Neck supple.  Cardiovascular: Normal rate and regular rhythm.  Exam reveals no gallop and no friction rub.   No murmur heard. Pulmonary/Chest: Effort normal. She has no wheezes. She has no rales.  Abdominal: Soft. She exhibits no distension and no mass. There is no tenderness. There is no guarding.  Genitourinary:  Genitourinary Comments: Patient declined pelvic exam  Musculoskeletal: She exhibits edema (bilateral lower extremity edema right greater than left.) and tenderness.  Complains of pain on palpation anywhere on her body.  Neurological: She is alert and oriented to person, place, and time.  Muscle strength 4+ and equal upper and lower.  No clonus, 2+ reflexes  Skin: Skin is warm and dry. She is not diaphoretic.  Psychiatric: She has a normal mood and affect. Her behavior is normal.  Nursing note and vitals reviewed.    ED Treatments / Results  Labs (all labs ordered are listed, but only abnormal results are displayed) Labs Reviewed  CBC WITH DIFFERENTIAL/PLATELET - Abnormal; Notable for the following:        Result Value   RBC 3.40 (*)    Hemoglobin 10.5 (*)    HCT 33.0 (*)    All other components within normal limits  COMPREHENSIVE METABOLIC PANEL - Abnormal; Notable for the following:    Creatinine, Ser 1.64 (*)    GFR calc non Af Amer 32 (*)    GFR calc Af Amer 37 (*)    All other components within normal limits  CK - Abnormal; Notable for the following:    Total CK 511 (*)    All other components within normal limits  URINALYSIS, ROUTINE W REFLEX MICROSCOPIC - Abnormal; Notable for the following:    APPearance HAZY (*)    All other components within normal limits  I-STAT CHEM 8, ED - Abnormal; Notable for the following:    Creatinine, Ser 1.70 (*)    Hemoglobin 10.5 (*)    HCT 31.0 (*)    All other components within normal limits  RPR  GC/CHLAMYDIA PROBE AMP ([place]) NOT AT [facility]    EKG  EKG Interpretation None       Radiology No results found.  Procedures Procedures (including critical care time)  Medications Ordered in ED Medications  sodium chloride 0.9 % bolus 1,000 mL (0 mLs Intravenous Stopped 05/26/17 2345)     Initial Impression / Assessment and Plan / ED Course  I have reviewed the triage vital signs and the nursing notes.  Pertinent labs & imaging results that were available during my care of the patient were reviewed by me and considered in my medical decision making (see chart for details).     66 yo F With a chief complaint of diffuse body aches. Going on for years, symptoms worsening today to where she feels she cannot walk. She is unable to describe the symptoms much further. Bilateral lower extremity edema R > L.  Hx of same.    RLE with some erythema and warmth.  Unchanged from prior per patient.  Will obtain lab workup.    Labs and urine are at her baseline. Patient continues to complain of bilateral foot pain.As she has said this has been going on for years I do not see an indication for admission   The patient had a family member, the  room and she became very angry that was not admitting her for a diagnostic workup. I try to discuss at length  that this is not a normal practice. I recommended follow-up with her infectious disease doctor. At some point she took a phrase out of context and repeating it to me stating that she was going to file a complaint.  Case management was consulted for home health.     I have discussed the diagnosis/risks/treatment options with the patient and family and believe the pt to be eligible for discharge home to follow-up with ID. We also discussed returning to the ED immediately if new or worsening sx occur. We discussed the sx which are most concerning (e.g., sudden worsening pain, fever, inability to tolerate by mouth) that necessitate immediate return. Medications administered to the patient during their visit and any new prescriptions provided to the patient are listed below.  Medications given during this visit Medications  sodium chloride 0.9 % bolus 1,000 mL (0 mLs Intravenous Stopped 05/26/17 2345)     The patient appears reasonably screen and/or stabilized for discharge and I doubt any other medical condition or other [facility] requiring further screening, evaluation, or treatment in the ED at this time prior to discharge.     Final Clinical Impressions(s) / ED Diagnoses   Final diagnoses:  Bilateral leg pain    New Prescriptions Discharge Medication List as of 05/27/2017 12:06 AM       Deno Etienne, DO 05/27/17 1504

## 2017-05-26 NOTE — ED Triage Notes (Signed)
Pt arrives via ems with reports of bil feet legs and back pain with worsening edema in bil feet. Pt poor historian but states the pain started today. Per ems pt able to ambulate at baseline but today was unable to get up. VSS with EMS.

## 2017-05-26 NOTE — ED Notes (Signed)
Pt's sister Tyleah Loh [phone] please call for updates

## 2017-05-27 LAB — RPR: RPR: NONREACTIVE

## 2017-05-27 NOTE — ED Notes (Signed)
Case manager at bedside 

## 2017-05-27 NOTE — ED Notes (Signed)
This RN approached by patient's sister who was concerned because MD said there was nothing he could do for the patient and she would be discharged home. Sister explained patient couldn't walk and that there was "something wrong" because patient was incontinent of urine.  I spoke with MD; he had placed order for consult for care management. I went to explained to sister but could not find her; not in room, not in [place].  I explained to patient about consult and that this would most likely take place in the morning, meaning that she would be in ED until then. Patient acknowledged understanding.

## 2017-05-27 NOTE — Discharge Planning (Signed)
I have recommended that this patient have [name] but she declines at this time. I have discussed the risks and benefits of this service with her. CM informed patient that if she changed her mind, her primary care physician can make arrangements from her office.The patient verbalizes understanding.

## 2017-05-27 NOTE — ED Notes (Signed)
Pt provided with turkey sandwich and graham crackers 

## 2017-05-27 NOTE — ED Notes (Signed)
Pt requesting to "wash up" pt given warm water, hygeine items, mesh underwear & feminine napkin, pt has lunch tray

## 2017-05-27 NOTE — ED Notes (Signed)
Pt sts her sister has her house key and will off work at [employer]; plan to disposition pt at that time

## 2017-05-27 NOTE — ED Notes (Signed)
Sister brought back pt belongings.

## 2017-06-02 ENCOUNTER — Ambulatory Visit: Payer: Self-pay | Admitting: Infectious Disease

## 2017-06-29 ENCOUNTER — Other Ambulatory Visit: Payer: Self-pay | Admitting: Pharmacist Clinician (PhC)/ Clinical Pharmacy Specialist

## 2017-06-29 DIAGNOSIS — B20 Human immunodeficiency virus [HIV] disease: Secondary | ICD-10-CM

## 2017-06-29 NOTE — Telephone Encounter (Signed)
Amy from [facility] told us to reach out to her because the pharmacy can get in touch with her to ship meds. Talked to her sister today. Caren Griffins said that even she is having issue getting in touch with her. She doesn't think Lorree is taking meds either. She is having a lot of issue with pain, movement, getting around. I'm going to reach out to Ambre to see if she can do an evaluation.

## 2017-06-29 NOTE — Telephone Encounter (Signed)
Would love to connect with her! Adding a referral so I can attempt to contact the patient

## 2017-06-30 NOTE — Telephone Encounter (Signed)
I added the referral so we are good to go

## 2017-06-30 NOTE — Telephone Encounter (Signed)
Thanks Ambre. I think we can get her in a better place and viral load suppressed. It was NOT high last time checkec before all of this

## 2017-06-30 NOTE — Addendum Note (Signed)
Addended by: Kinnie Scales L on: 06/30/2017 10:03 AM   Modules accepted: Orders

## 2017-07-01 ENCOUNTER — Telehealth: Payer: Self-pay | Admitting: *Deleted

## 2017-07-01 ENCOUNTER — Ambulatory Visit: Payer: Self-pay | Admitting: *Deleted

## 2017-07-01 DIAGNOSIS — B20 Human immunodeficiency virus [HIV] disease: Secondary | ICD-10-CM

## 2017-07-01 NOTE — Telephone Encounter (Signed)
Referral received from Dr VanDam/Minh/Amy Ayesha Rumpf. Order is for RN begin attempts to engage with the patient and offer services. Focus should be on addressing the patient's barrier to care and medication adherence. Patient may be having trouble adhering to her medications. Previously Ms [name] has maintatined an undetectable status since 2016

## 2017-07-01 NOTE — Telephone Encounter (Signed)
Patient's sister Audreena called back stating her sister really needs some help. She is currently in [place] and stated her sister is in a unsafe environment with people gathering on her porch doing illegal things and vandalizing the house. Police have been called and the intruders have been arrested but they return because the patient is not strong enough to come out side and demand that the people leave. The home is also infected with bedbugs. RN advised that I will ride by the home today but if I feel that my safety will be in jeopardy I will not be able to stop. Sister verbalized understanding. Drive by visit planned for today

## 2017-07-20 ENCOUNTER — Ambulatory Visit: Payer: Self-pay | Admitting: *Deleted

## 2017-07-20 DIAGNOSIS — B20 Human immunodeficiency virus [HIV] disease: Secondary | ICD-10-CM

## 2017-07-21 NOTE — Progress Notes (Signed)
Prior to visit RN spoke with the patient's sister about the need for assistance. Ms. [name] stated ms [name] really could [place] some help. [name] stated she has unwanted visitors at the home that tear up the home. Police have been called to keep people aware from loitering at the home.  On arrival a note was on the home stating "get off my porch and don't knock"  RN still knocked on the door to offer assistance. After waiting for a while RN left the home after knocking several times and waiting to allow adequate time for the patient to reach the door.

## 2017-08-17 ENCOUNTER — Ambulatory Visit: Payer: Self-pay | Admitting: Infectious Disease

## 2017-09-10 ENCOUNTER — Other Ambulatory Visit: Payer: Medicare Other

## 2017-09-10 ENCOUNTER — Other Ambulatory Visit (HOSPITAL_COMMUNITY)
Admission: RE | Admit: 2017-09-10 | Discharge: 2017-09-10 | Disposition: A | Discharge status: Home / Self Care | Payer: Medicare Other | Source: Ambulatory Visit | Attending: Infectious Disease | Admitting: Infectious Disease

## 2017-09-10 DIAGNOSIS — B20 Human immunodeficiency virus [HIV] disease: Secondary | ICD-10-CM

## 2017-09-10 DIAGNOSIS — R350 Frequency of micturition: Secondary | ICD-10-CM | POA: Diagnosis present

## 2017-09-10 DIAGNOSIS — Z113 Encounter for screening for infections with a predominantly sexual mode of transmission: Secondary | ICD-10-CM

## 2017-09-11 LAB — URINALYSIS, ROUTINE W REFLEX MICROSCOPIC
BILIRUBIN URINE: NEGATIVE
GLUCOSE, UA: NEGATIVE
Hgb urine dipstick: NEGATIVE
NITRITE: POSITIVE — AB
RBC / HPF: NONE SEEN /HPF (ref 0–2)
SPECIFIC GRAVITY, URINE: 1.037 — AB (ref 1.001–1.03)
WBC, UA: >=60 /HPF — AB (ref 0–5)
pH: <=5 (ref 5.0–8.0)

## 2017-09-11 LAB — CBC WITH DIFFERENTIAL/PLATELET
BASOS ABS: 52 {cells}/uL (ref 0–200)
Basophils Relative: 0.9 %
EOS ABS: 70 {cells}/uL (ref 15–500)
EOS PCT: 1.2 %
HCT: 32.2 % — ABNORMAL LOW (ref 35.0–45.0)
HEMOGLOBIN: 10.9 g/dL — AB (ref 11.7–15.5)
Lymphs Abs: 2477 cells/uL (ref 850–3900)
MCH: 32 pg (ref 27.0–33.0)
MCHC: 33.9 g/dL (ref 32.0–36.0)
MCV: 94.4 fL (ref 80.0–100.0)
MONOS PCT: 4.8 %
MPV: 10.6 fL (ref 7.5–12.5)
NEUTROS PCT: 50.4 %
Neutro Abs: 2923 cells/uL (ref 1500–7800)
Platelets: 284 10*3/uL (ref 140–400)
RBC: 3.41 10*6/uL — ABNORMAL LOW (ref 3.80–5.10)
RDW: 13.4 % (ref 11.0–15.0)
Total Lymphocyte: 42.7 %
WBC mixed population: 278 cells/uL (ref 200–950)
WBC: 5.8 10*3/uL (ref 3.8–10.8)

## 2017-09-11 LAB — MICROALBUMIN / CREATININE URINE RATIO
Creatinine, Urine: 378 mg/dL — ABNORMAL HIGH (ref 20–275)
MICROALB/CREAT RATIO: 12 ug/mg{creat} (ref <30)
Microalb, Ur: 4.7 mg/dL

## 2017-09-11 LAB — COMPREHENSIVE METABOLIC PANEL
AG RATIO: 1.1 (calc) (ref 1.0–2.5)
ALT: 5 U/L — AB (ref 6–29)
AST: 13 U/L (ref 10–35)
Albumin: 4 g/dL (ref 3.6–5.1)
Alkaline phosphatase (APISO): 65 U/L (ref 33–130)
BILIRUBIN TOTAL: 0.5 mg/dL (ref 0.2–1.2)
BUN/Creatinine Ratio: 12 (calc) (ref 6–22)
BUN: 20 mg/dL (ref 7–25)
CALCIUM: 9.6 mg/dL (ref 8.6–10.4)
CO2: 25 mmol/L (ref 20–32)
Chloride: 107 mmol/L (ref 98–110)
Creat: 1.62 mg/dL — ABNORMAL HIGH (ref 0.50–0.99)
GLUCOSE: 112 mg/dL — AB (ref 65–99)
Globulin: 3.6 g/dL (calc) (ref 1.9–3.7)
Potassium: 4.1 mmol/L (ref 3.5–5.3)
SODIUM: 141 mmol/L (ref 135–146)
TOTAL PROTEIN: 7.6 g/dL (ref 6.1–8.1)

## 2017-09-11 LAB — RPR: RPR: NONREACTIVE

## 2017-09-11 LAB — T-HELPER CELL (CD4) - (RCID CLINIC ONLY)
CD4 T CELL ABS: 1120 /uL (ref 400–2700)
CD4 T CELL HELPER: 46 % (ref 33–55)

## 2017-09-14 LAB — HIV-1 RNA QUANT-NO REFLEX-BLD
HIV 1 RNA Quant: <20 copies/mL
HIV-1 RNA Quant, Log: <1.3 Log copies/mL

## 2017-09-14 LAB — URINE CYTOLOGY ANCILLARY ONLY
Chlamydia: NEGATIVE
NEISSERIA GONORRHEA: NEGATIVE

## 2017-09-14 NOTE — Progress Notes (Signed)
RN made a home visit with [name]. After describing my role wit the clinic and community Ayan declined stating she does not need or want any assistance with medication adherence. Ms Saudia stated she knows exactly how to take her medications and at times feels frustrated by her sister and the arguments that they have. Currently Ligia is receiving assistance from Casey([facility] Case manager) for housing assistance

## 2017-09-14 NOTE — Patient Instructions (Signed)
Declined my services

## 2017-09-22 ENCOUNTER — Other Ambulatory Visit: Payer: Self-pay | Admitting: Infectious Disease

## 2017-09-22 DIAGNOSIS — C73 Malignant neoplasm of thyroid gland: Secondary | ICD-10-CM

## 2017-09-28 ENCOUNTER — Ambulatory Visit: Payer: Self-pay | Admitting: Infectious Disease

## 2017-09-30 ENCOUNTER — Encounter: Payer: Self-pay | Admitting: Infectious Disease

## 2017-09-30 ENCOUNTER — Ambulatory Visit (INDEPENDENT_AMBULATORY_CARE_PROVIDER_SITE_OTHER): Payer: Medicare Other | Admitting: Infectious Disease

## 2017-09-30 VITALS — BP 112/73 | HR 84 | Temp 98.6°F | Wt 125.0 lb

## 2017-09-30 DIAGNOSIS — B20 Human immunodeficiency virus [HIV] disease: Secondary | ICD-10-CM | POA: Diagnosis not present

## 2017-09-30 DIAGNOSIS — F332 Major depressive disorder, recurrent severe without psychotic features: Secondary | ICD-10-CM | POA: Diagnosis not present

## 2017-09-30 DIAGNOSIS — G63 Polyneuropathy in diseases classified elsewhere: Secondary | ICD-10-CM

## 2017-09-30 DIAGNOSIS — F325 Major depressive disorder, single episode, in full remission: Secondary | ICD-10-CM | POA: Diagnosis present

## 2017-09-30 DIAGNOSIS — C73 Malignant neoplasm of thyroid gland: Secondary | ICD-10-CM | POA: Diagnosis not present

## 2017-09-30 DIAGNOSIS — F411 Generalized anxiety disorder: Secondary | ICD-10-CM

## 2017-09-30 DIAGNOSIS — L299 Pruritus, unspecified: Secondary | ICD-10-CM

## 2017-09-30 DIAGNOSIS — R634 Abnormal weight loss: Secondary | ICD-10-CM | POA: Diagnosis not present

## 2017-09-30 DIAGNOSIS — F330 Major depressive disorder, recurrent, mild: Secondary | ICD-10-CM | POA: Diagnosis not present

## 2017-09-30 LAB — THYROID PANEL WITH TSH
FREE THYROXINE INDEX: 2.4 (ref 1.4–3.8)
T3 UPTAKE: 28 % (ref 22–35)
T4 TOTAL: 8.4 ug/dL (ref 5.1–11.9)
TSH: 51.44 m[IU]/L — AB (ref 0.40–4.50)

## 2017-09-30 MED ORDER — LEVOTHYROXINE SODIUM 150 MCG PO TABS: 150.0000 ug | ORAL_TABLET | Freq: Every day | ORAL | 2 refills | Status: DC

## 2017-09-30 MED ORDER — GABAPENTIN 300 MG PO CAPS: 300.0000 mg | ORAL_CAPSULE | Freq: Three times a day (TID) | ORAL | 2 refills | Status: DC

## 2017-09-30 MED ORDER — DARUN-COBIC-EMTRICIT-TENOFAF 800-150-200-10 MG PO TABS: 1.0000 | ORAL_TABLET | Freq: Every day | ORAL | 11 refills | Status: DC

## 2017-09-30 MED ORDER — HYDROXYZINE HCL 25 MG PO TABS: 25.0000 mg | ORAL_TABLET | Freq: Three times a day (TID) | ORAL | 2 refills | Status: DC | PRN

## 2017-09-30 MED ORDER — CITALOPRAM HYDROBROMIDE 20 MG PO TABS: 20.0000 mg | ORAL_TABLET | Freq: Every day | ORAL | 5 refills | Status: DC
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
## 2017-10-01 ENCOUNTER — Other Ambulatory Visit: Payer: Self-pay | Admitting: Pharmacist Clinician (PhC)/ Clinical Pharmacy Specialist

## 2017-10-01 MED ORDER — ATAZANAVIR-COBICISTAT 300-150 MG PO TABS: 1.0000 | ORAL_TABLET | Freq: Every day | ORAL | 11 refills | Status: DC

## 2017-10-01 MED ORDER — EMTRICITABINE-TENOFOVIR AF 200-25 MG PO TABS: 1.0000 | ORAL_TABLET | Freq: Every day | ORAL | 11 refills | Status: DC

## 2017-10-01 MED FILL — EVOTAZ 300 MG-150 MG TABLET: 300-150 | 30 days supply | Qty: 30 | Fill #0

## 2017-10-01 MED FILL — DESCOVY 200-25 MG TABS: 200-25 | 30 days supply | Qty: 30 | Fill #0

## 2017-10-01 NOTE — Progress Notes (Unsigned)
Had to change to Evotaz/Descovy due to sulfa allergy.

## 2017-10-12 ENCOUNTER — Other Ambulatory Visit: Payer: Self-pay | Admitting: Infectious Disease

## 2017-10-16 ENCOUNTER — Encounter (HOSPITAL_COMMUNITY): Payer: Self-pay

## 2017-10-16 ENCOUNTER — Other Ambulatory Visit: Payer: Self-pay

## 2017-10-16 DIAGNOSIS — F17210 Nicotine dependence, cigarettes, uncomplicated: Secondary | ICD-10-CM | POA: Insufficient documentation

## 2017-10-16 DIAGNOSIS — R200 Anesthesia of skin: Secondary | ICD-10-CM | POA: Diagnosis not present

## 2017-10-16 DIAGNOSIS — Z85850 Personal history of malignant neoplasm of thyroid: Secondary | ICD-10-CM | POA: Insufficient documentation

## 2017-10-16 DIAGNOSIS — J45909 Unspecified asthma, uncomplicated: Secondary | ICD-10-CM | POA: Diagnosis not present

## 2017-10-16 DIAGNOSIS — A5901 Trichomonal vulvovaginitis: Secondary | ICD-10-CM | POA: Diagnosis not present

## 2017-10-16 DIAGNOSIS — Z21 Asymptomatic human immunodeficiency virus [HIV] infection status: Secondary | ICD-10-CM | POA: Insufficient documentation

## 2017-10-16 DIAGNOSIS — J449 Chronic obstructive pulmonary disease, unspecified: Secondary | ICD-10-CM | POA: Insufficient documentation

## 2017-10-16 DIAGNOSIS — G9009 Other idiopathic peripheral autonomic neuropathy: Secondary | ICD-10-CM | POA: Insufficient documentation

## 2017-10-16 DIAGNOSIS — Z79899 Other long term (current) drug therapy: Secondary | ICD-10-CM | POA: Diagnosis not present

## 2017-10-16 DIAGNOSIS — N189 Chronic kidney disease, unspecified: Secondary | ICD-10-CM | POA: Diagnosis not present

## 2017-10-16 DIAGNOSIS — R531 Weakness: Secondary | ICD-10-CM | POA: Diagnosis present

## 2017-10-16 LAB — CBC
HCT: 30.8 % — ABNORMAL LOW (ref 36.0–46.0)
Hemoglobin: 10.1 g/dL — ABNORMAL LOW (ref 12.0–15.0)
MCH: 31.3 pg (ref 26.0–34.0)
MCHC: 32.8 g/dL (ref 30.0–36.0)
MCV: 95.4 fL (ref 78.0–100.0)
PLATELETS: 283 10*3/uL (ref 150–400)
RBC: 3.23 MIL/uL — ABNORMAL LOW (ref 3.87–5.11)
RDW: 14.3 % (ref 11.5–15.5)
WBC: 5.9 10*3/uL (ref 4.0–10.5)

## 2017-10-16 LAB — BASIC METABOLIC PANEL
Anion gap: 7 (ref 5–15)
BUN: 18 mg/dL (ref 6–20)
CALCIUM: 8.8 mg/dL — AB (ref 8.9–10.3)
CHLORIDE: 107 mmol/L (ref 101–111)
CO2: 23 mmol/L (ref 22–32)
CREATININE: 1.62 mg/dL — AB (ref 0.44–1.00)
GFR calc Af Amer: 37 mL/min — ABNORMAL LOW (ref >60)
GFR calc non Af Amer: 32 mL/min — ABNORMAL LOW (ref >60)
GLUCOSE: 100 mg/dL — AB (ref 65–99)
Potassium: 4.5 mmol/L (ref 3.5–5.1)
Sodium: 137 mmol/L (ref 135–145)

## 2017-10-16 LAB — URINALYSIS, ROUTINE W REFLEX MICROSCOPIC
BILIRUBIN URINE: NEGATIVE
Glucose, UA: NEGATIVE mg/dL
Hgb urine dipstick: NEGATIVE
Ketones, ur: NEGATIVE mg/dL
Nitrite: NEGATIVE
Protein, ur: NEGATIVE mg/dL
SPECIFIC GRAVITY, URINE: 1.023 (ref 1.005–1.030)
pH: 5 (ref 5.0–8.0)

## 2017-10-16 NOTE — ED Triage Notes (Signed)
Pt states she has been having body aches since yesterday,. She reports pain in her bottom. She also reports generalized weakness X1 week. She states she has trouble getting dressed and walking.

## 2017-10-17 ENCOUNTER — Emergency Department (HOSPITAL_COMMUNITY)
Admission: EM | Admit: 2017-10-17 | Discharge: 2017-10-17 | Disposition: A | Discharge status: Home / Self Care | Payer: Medicare Other | Attending: Emergency Medicine | Admitting: Emergency Medicine

## 2017-10-17 DIAGNOSIS — G629 Polyneuropathy, unspecified: Secondary | ICD-10-CM

## 2017-10-17 DIAGNOSIS — G9009 Other idiopathic peripheral autonomic neuropathy: Secondary | ICD-10-CM | POA: Diagnosis not present

## 2017-10-17 DIAGNOSIS — A5901 Trichomonal vulvovaginitis: Secondary | ICD-10-CM

## 2017-10-17 LAB — WET PREP, GENITAL
SPERM: NONE SEEN
Yeast Wet Prep HPF POC: NONE SEEN

## 2017-10-17 MED ORDER — AZITHROMYCIN 250 MG PO TABS
1000.0000 mg | ORAL_TABLET | Freq: Once | ORAL | Status: AC
Start: 2017-10-17 — End: 2017-10-17
  Administered 2017-10-17: 1000 mg via ORAL
  Filled 2017-10-17: qty 4

## 2017-10-17 MED ORDER — METRONIDAZOLE 500 MG PO TABS
2000.0000 mg | ORAL_TABLET | Freq: Once | ORAL | Status: AC
  Administered 2017-10-17: 2000 mg via ORAL
  Filled 2017-10-17: qty 4

## 2017-10-17 MED ORDER — OXYCODONE-ACETAMINOPHEN 5-325 MG PO TABS
1.0000 | ORAL_TABLET | Freq: Once | ORAL | Status: AC
  Administered 2017-10-17: 1 via ORAL
  Filled 2017-10-17: qty 1

## 2017-10-17 MED ORDER — CEFTRIAXONE SODIUM 250 MG IJ SOLR
250.0000 mg | Freq: Once | INTRAMUSCULAR | Status: AC
Start: 2017-10-17 — End: 2017-10-17
  Administered 2017-10-17: 250 mg via INTRAMUSCULAR
  Filled 2017-10-17: qty 250

## 2017-10-17 MED ORDER — LIDOCAINE HCL (PF) 1 % IJ SOLN
INTRAMUSCULAR | Status: AC
  Administered 2017-10-17: 0.9 mL
  Filled 2017-10-17: qty 5

## 2017-10-17 MED ORDER — METRONIDAZOLE 500 MG PO TABS: 500.0000 mg | ORAL_TABLET | Freq: Two times a day (BID) | ORAL | 0 refills | Status: DC

## 2017-10-17 MED ORDER — OXYCODONE-ACETAMINOPHEN 5-325 MG PO TABS: 1.0000 | ORAL_TABLET | Freq: Four times a day (QID) | ORAL | 0 refills | Status: DC | PRN

## 2017-10-17 NOTE — ED Notes (Signed)
Ambulated pt from room B17 to front of the hall at room B15 and back to B 17 with the assistance of a walker.  Pt denies any sob but complains of some dizziness and leg pain.

## 2017-10-17 NOTE — ED Notes (Signed)
OK per Dr Leonides Schanz to give pt hot Tea with sugar

## 2017-10-17 NOTE — ED Provider Notes (Signed)
TIME SEEN: 1:17 AM  CHIEF COMPLAINT: Multiple complaints  HPI: Patient is a 66 year old female with history of chronic kidney disease, COPD, HIV with history of neuropathy secondary to HIV who presents to the emergency department with complaints of pain in bilateral feet and hands with numbness and tingling.  States that has been present for over a year but she came to the emergency department tonight because the pain was getting worse.  It hurts to walk.  She is able to ambulate and does so with a walker.  Was able to ambulate today.  States she has had weakness in both her legs for also over a year.  No new numbness or weakness today.  She denies bowel or bladder incontinence.  No back pain.  Her last CD4 count was 1120 and a nondetectable viral load on September 10, 2017.  She denies fevers, cough, vomiting or diarrhea.  No recent chest pain or shortness of breath.  She did have a flu vaccination yesterday.  She does not feel like her symptoms worsened after her flu shot.  She is also complaining of urinary frequency for the past month.  Also complaining of yellow vaginal discharge for the past 2 weeks.  She is sexually active.  Reports she has had STDs before.  She is also requesting that I look at her "butt bone".  She reports it has been hurting for several weeks.  No injury that she is aware of.  No redness, warmth or mass.  No rectal pain.  ROS: See HPI Constitutional: no fever  Eyes: no drainage  ENT: no runny nose   Cardiovascular:  no chest pain  Resp: no SOB  GI: no vomiting GU: no dysuria Integumentary: no rash  Allergy: no hives  Musculoskeletal: no leg swelling  Neurological: no slurred speech ROS otherwise negative  PAST MEDICAL HISTORY/PAST SURGICAL HISTORY:  Past Medical History:  Diagnosis Date  . Anxiety   . Arthritis   . Asthma   . Chronic bronchitis   . COPD (chronic obstructive pulmonary disease) ([place])   . Depression   . Fibromyalgia    "I think I got that"  .  Headache(784.0)   . HIV (human immunodeficiency virus infection) ([place])    "dx'd ~ 12/2011"  . Lipoma    right forearm  . 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 Pneumonia ~ 2000's   "once"  . Shortness of breath on exertion   . Thyroid cancer s/p total thyroidectomy April2013 02/06/2012   Per patient report.  Recently diagnosed. Awaiting procedure to have thyroid removed.  Under treatment with Dr Janace Hoard .   Marland Kitchen Vaginal discharge 12/19/2015    MEDICATIONS:  Prior to Admission medications   Medication Sig Start Date End Date Taking? Authorizing Provider  albuterol (PROVENTIL HFA;VENTOLIN HFA) 108 (90 Base) MCG/ACT inhaler Inhale 2 puffs into the lungs every 4 (four) hours as needed for wheezing or shortness of breath. 04/24/17   Cardama, Grayce Sessions, MD  atazanavir-cobicistat (EVOTAZ) 300-150 MG tablet Take 1 tablet by mouth daily with breakfast. Swallow whole. Do NOT crush, cut or chew tablet. Take with food. 10/01/17   Truman Hayward, MD  citalopram (CELEXA) 20 MG tablet Take 1 tablet (20 mg total) by mouth daily. 09/30/17 09/30/18  Truman Hayward, MD  emtricitabine-tenofovir AF (DESCOVY) 200-25 MG tablet Take 1 tablet by mouth daily. 10/01/17   Truman Hayward, MD  ENSURE (ENSURE) Take  237 mLs by mouth 2 (two) times daily between meals. 04/13/17   Pham, Minh Q, RPH-CPP  gabapentin (NEURONTIN) 300 MG capsule Take 1 capsule (300 mg total) by mouth 3 (three) times daily. 09/30/17   Truman Hayward, MD  hydrOXYzine (ATARAX/VISTARIL) 25 MG tablet Take 1 tablet (25 mg total) by mouth every 8 (eight) hours as needed for itching. 09/30/17   Truman Hayward, MD  ibuprofen (ADVIL,MOTRIN) 200 MG tablet Take 400 mg by mouth every 6 (six) hours as needed for mild pain or moderate pain.    [provider]  levothyroxine (SYNTHROID, LEVOTHROID) 150 MCG tablet Take 1 tablet (150 mcg total) by mouth daily. 09/30/17   Truman Hayward, MD  megestrol (MEGACE) 20 MG tablet TAKE ONE TABLET BY MOUTH EVERY DAY 10/12/17   Tommy Medal, Lavell Islam, MD    ALLERGIES:  Allergies  Allergen Reactions  . Sulfa Antibiotics Hives and Other (See Comments)    "in my shoulders; legs; feet; made me burn all over"    SOCIAL HISTORY:  Social History   Tobacco Use  . Smoking status: Current Every Day Smoker    Packs/day: 1.00    Years: 47.00    Pack years: 47.00    Types: Cigarettes  . Smokeless tobacco: Former Systems developer    Quit date: 12/01/1974  . Tobacco comment: currently smokes   Substance Use Topics  . Alcohol use: No    Comment: 03/11/12 "no beer or wine ~ 01/2012"    FAMILY HISTORY: Family History  Problem Relation Age of Onset  . Heart disease Maternal Uncle     EXAM: BP 90/68 (BP Location: Right Arm)   Pulse 83   Temp 98.7 F (37.1 C) (Oral)   Resp 18   SpO2 97%  CONSTITUTIONAL: Alert and oriented and responds appropriately to questions.  Thin, chronically ill-appearing HEAD: Normocephalic EYES: Conjunctivae clear, pupils appear equal, EOMI ENT: normal nose; moist mucous membranes NECK: Supple, no meningismus, no nuchal rigidity, no LAD  CARD: RRR; S1 and S2 appreciated; no murmurs, no clicks, no rubs, no gallops RESP: Normal chest excursion without splinting or tachypnea; breath sounds clear and equal bilaterally; no wheezes, no rhonchi, no rales, no hypoxia or respiratory distress, speaking full sentences ABD/GI: Normal bowel sounds; non-distended; soft, non-tender, no rebound, no guarding, no peritoneal signs, no hepatosplenomegaly GU:  Normal external genitalia. No lesions, rashes noted. Patient has no vaginal bleeding on exam.  Significant amount of yellow foul-smelling vaginal discharge.  No adnexal tenderness, mass or fullness, no cervical motion tenderness. Cervix is not appear friable.  Cervix is closed.  Chaperone present for exam. BACK:  The back appears normal and is non-tender to palpation, there  is no CVA tenderness, no midline spinal tenderness or step-off or deformity, no tenderness over the pelvis or sacrum, no erythema or warmth of her buttocks or fluctuance or induration, no subcutaneous emphysema on exam EXT: Normal ROM in all joints; non-tender to palpation; no edema; normal capillary refill; no cyanosis, no calf tenderness or swelling    SKIN: Normal color for age and race; warm; no rash, dry skin NEURO: Moves all extremities equally, patient does have some difficulty lifting both legs off the bed which she states is secondary to pain, normal strength in bilateral upper extremities, diminished sensation in her hands and feet bilaterally which she reports is chronic.  Cranial nerves II through XII intact.  Normal speech. PSYCH: The patient's mood and manner are appropriate. Grooming  and personal hygiene are appropriate.  MEDICAL DECISION MAKING: Patient here with complaints of worsening pain from her chronic neuropathy.  No new focal neurologic deficits on exam.  Did receive a flu vaccination yesterday but I do not think this is Ardine Eng seems that pain is the major reason she is here.  I do not think she has spinal stenosis, cauda equina, epidural abscess or hematoma, discitis or transverse myelitis.  She is immunocompetent and reports compliance with her HIV medications.  No fever here.  We will give her pain medication.  She does ambulate at baseline with a walker and was able to get on a bus and get to the emergency department and get into the waiting room without assistance.  I do not feel she needs emergent imaging of her spine or head at this time.  I do not feel she needs a lumbar puncture.  Will give Percocet and ambulate patient in the waiting room.  Patient also complaining of urinary frequency.  Urine shows moderate leukocytes but rare bacteria.  She is not having dysuria.  We will send a urine culture.  Labs are unremarkable except for elevated creatinine which is her  baseline.  No leukocytosis.  Normal electrolytes.  Will perform pelvic exam with cultures.   ED PROGRESS: Patient has significant amount of yellow foul-smelling vaginal discharge on exam.  Wet prep, gonorrhea and chlamydia pending.  Will cover empirically for STDs.  No adnexal tenderness or fullness.  No cervical motion tenderness.  Doubt torsion, TOA, PID on exam.   Patient's wet prep is positive for trichomonas.  She has received IM ceftriaxone and oral azithromycin and Flagyl.  She also has clue cells.  Will discharge home with prescription of Flagyl for the next week.  I recommended no sexual intercourse.  Have recommended her partner to be treated as well.  She reports pain improvement with Percocet.  Have recommended she follow-up with her PCP for further pain management of her chronic neuropathy.  Will discharge with short course of pain medication.  Pt able to ambulate with walker.  She appears comfortable and is eating and drinking in the room without difficulty.   At this time, I do not feel there is any life-threatening condition present. I have reviewed and discussed all results (EKG, imaging, lab, urine as appropriate) and exam findings with patient/family. I have reviewed nursing notes and appropriate previous records.  I feel the patient is safe to be discharged home without further emergent workup and can continue workup as an outpatient as needed. Discussed usual and customary return precautions. Patient/family verbalize understanding and are comfortable with this plan.  Outpatient follow-up has been provided if needed. All questions have been answered.     EKG Interpretation  Date/Time:  Friday October 16 2017 18:31:55 EST Ventricular Rate:  76 PR Interval:    QRS Duration: 114 QT Interval:  398 QTC Calculation: 447 R Axis:   -69 Text Interpretation:  Normal sinus rhythm Left axis deviation Right bundle branch block Abnormal ECG Artifact Needs repeat EKG Confirmed by Lamberto Dinapoli,  Cyril Mourning [phone]) on 10/17/2017 1:17:03 AM         EKG Interpretation  Date/Time:  Saturday October 17 2017 02:02:06 EST Ventricular Rate:  78 PR Interval:    QRS Duration: 128 QT Interval:  403 QTC Calculation: 459 R Axis:   74 Text Interpretation:  Sinus rhythm Right bundle branch block Nonspecific T abnormalities, lateral leads No significant change since last tracing Confirmed by Dekendrick Uzelac, Cyril Mourning (  [id number]) on 10/17/2017 2:06:31 AM          Praise Stennett, Delice Bison, DO 10/17/17 [id number]

## 2017-10-19 LAB — GC/CHLAMYDIA PROBE AMP (~~LOC~~) NOT AT ARMC
Chlamydia: NEGATIVE
Neisseria Gonorrhea: NEGATIVE

## 2017-11-02 ENCOUNTER — Ambulatory Visit: Payer: Self-pay | Admitting: Infectious Disease
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
## 2017-11-13 ENCOUNTER — Other Ambulatory Visit: Payer: Self-pay | Admitting: *Deleted

## 2017-11-13 ENCOUNTER — Telehealth: Payer: Self-pay | Admitting: *Deleted

## 2017-11-13 DIAGNOSIS — C73 Malignant neoplasm of thyroid gland: Secondary | ICD-10-CM

## 2017-11-13 MED ORDER — LEVOTHYROXINE SODIUM 150 MCG PO TABS: 150.0000 ug | ORAL_TABLET | Freq: Every day | ORAL | 0 refills | Status: DC

## 2017-11-13 NOTE — Telephone Encounter (Signed)
Patient left message in Triage, calling from her niece's phone number. She is asking for a refill of synthroid. Patient restarted in October by Dr Tommy Medal, has not yet followed up with DR Jonelle Sidle. RN called Dr Leontine Locket office - they will reach out to the patient today to get her back into the office. Landis Gandy, RN

## 2017-11-16 ENCOUNTER — Telehealth: Payer: Self-pay | Admitting: *Deleted

## 2017-11-16 NOTE — Telephone Encounter (Signed)
Hydroxazine PA initiated for patient via cover my med. Landis Gandy, RN

## 2017-11-18 NOTE — Telephone Encounter (Signed)
Received fax stating hydroxyzine hcl has been approved. Does the pharmacy need to be notified? [name]

## 2017-11-20 NOTE — Telephone Encounter (Signed)
Yes please

## 2017-11-20 NOTE — Telephone Encounter (Signed)
Done

## 2017-12-22 ENCOUNTER — Emergency Department (HOSPITAL_COMMUNITY)
Admission: EM | Admit: 2017-12-22 | Discharge: 2017-12-23 | Disposition: A | Discharge status: Home / Self Care | Payer: Medicare Other | Attending: Emergency Medicine | Admitting: Emergency Medicine

## 2017-12-22 ENCOUNTER — Encounter (HOSPITAL_COMMUNITY): Payer: Self-pay

## 2017-12-22 ENCOUNTER — Emergency Department (HOSPITAL_COMMUNITY): Payer: Medicare Other

## 2017-12-22 ENCOUNTER — Other Ambulatory Visit: Payer: Self-pay

## 2017-12-22 DIAGNOSIS — R110 Nausea: Secondary | ICD-10-CM | POA: Insufficient documentation

## 2017-12-22 DIAGNOSIS — F419 Anxiety disorder, unspecified: Secondary | ICD-10-CM | POA: Diagnosis not present

## 2017-12-22 DIAGNOSIS — F17210 Nicotine dependence, cigarettes, uncomplicated: Secondary | ICD-10-CM | POA: Insufficient documentation

## 2017-12-22 DIAGNOSIS — G8929 Other chronic pain: Secondary | ICD-10-CM | POA: Diagnosis not present

## 2017-12-22 DIAGNOSIS — J45909 Unspecified asthma, uncomplicated: Secondary | ICD-10-CM | POA: Diagnosis not present

## 2017-12-22 DIAGNOSIS — J449 Chronic obstructive pulmonary disease, unspecified: Secondary | ICD-10-CM | POA: Insufficient documentation

## 2017-12-22 DIAGNOSIS — Z21 Asymptomatic human immunodeficiency virus [HIV] infection status: Secondary | ICD-10-CM | POA: Insufficient documentation

## 2017-12-22 DIAGNOSIS — M545 Low back pain, unspecified: Secondary | ICD-10-CM

## 2017-12-22 DIAGNOSIS — R202 Paresthesia of skin: Secondary | ICD-10-CM | POA: Insufficient documentation

## 2017-12-22 DIAGNOSIS — Z85850 Personal history of malignant neoplasm of thyroid: Secondary | ICD-10-CM | POA: Diagnosis not present

## 2017-12-22 DIAGNOSIS — R0789 Other chest pain: Secondary | ICD-10-CM | POA: Insufficient documentation

## 2017-12-22 DIAGNOSIS — Z79899 Other long term (current) drug therapy: Secondary | ICD-10-CM | POA: Insufficient documentation

## 2017-12-22 LAB — CBC
HEMATOCRIT: 35.3 % — AB (ref 36.0–46.0)
HEMOGLOBIN: 12.1 g/dL (ref 12.0–15.0)
MCH: 33 pg (ref 26.0–34.0)
MCHC: 34.3 g/dL (ref 30.0–36.0)
MCV: 96.2 fL (ref 78.0–100.0)
PLATELETS: 246 10*3/uL (ref 150–400)
RBC: 3.67 MIL/uL — ABNORMAL LOW (ref 3.87–5.11)
RDW: 13.9 % (ref 11.5–15.5)
WBC: 7.8 10*3/uL (ref 4.0–10.5)

## 2017-12-22 LAB — I-STAT TROPONIN, ED: Troponin i, poc: 0.01 ng/mL (ref 0.00–0.08)

## 2017-12-22 MED ORDER — OXYCODONE-ACETAMINOPHEN 5-325 MG PO TABS
1.0000 | ORAL_TABLET | Freq: Once | ORAL | Status: AC
  Administered 2017-12-23: 1 via ORAL
  Filled 2017-12-22: qty 1

## 2017-12-22 MED ORDER — ASPIRIN 81 MG PO CHEW: 324.0000 mg | CHEWABLE_TABLET | Freq: Once | ORAL | Status: DC

## 2017-12-22 NOTE — ED Triage Notes (Signed)
To hallway bed via EMS.  Onset 2:30p chest pain- tight and shooting pain, "couldn't breathe", seeing spots, sensitive to light, abd/pelvic pain/groin pain, decreased sensation/tingling to bilateral feet, pain going up and down feet,  Pt was getting MRI today for onset 1 week hip pain when symptoms started.  Pt reported to EMS that her left foot has been dragging today.  Pt walked a long way to get to bus station.  EMS gave ASA 324 mg, NTG x 1 which decreased pain from 8/10 to 7/10.

## 2017-12-22 NOTE — ED Notes (Signed)
Transport to Xray

## 2017-12-22 NOTE — ED Notes (Signed)
Pt has multiple complaints;pt rambles on about home life and having electrial problems; pt states not being able to walk or stand without using walker; pt denies non compliance with HIV meds but she did miss 4 days; Pt denies being homeless-[name],RN

## 2017-12-23 DIAGNOSIS — R0789 Other chest pain: Secondary | ICD-10-CM | POA: Diagnosis not present

## 2017-12-23 LAB — BRAIN NATRIURETIC PEPTIDE: B NATRIURETIC PEPTIDE 5: 14.6 pg/mL (ref 0.0–100.0)

## 2017-12-23 LAB — COMPREHENSIVE METABOLIC PANEL
ALT: 28 U/L (ref 14–54)
AST: 32 U/L (ref 15–41)
Albumin: 3.3 g/dL — ABNORMAL LOW (ref 3.5–5.0)
Alkaline Phosphatase: 96 U/L (ref 38–126)
Anion gap: 11 (ref 5–15)
BILIRUBIN TOTAL: 1.2 mg/dL (ref 0.3–1.2)
BUN: 30 mg/dL — AB (ref 6–20)
CALCIUM: 8.5 mg/dL — AB (ref 8.9–10.3)
CO2: 23 mmol/L (ref 22–32)
CREATININE: 1.43 mg/dL — AB (ref 0.44–1.00)
Chloride: 104 mmol/L (ref 101–111)
GFR, EST AFRICAN AMERICAN: 43 mL/min — AB (ref >60)
GFR, EST NON AFRICAN AMERICAN: 38 mL/min — AB (ref >60)
Glucose, Bld: 131 mg/dL — ABNORMAL HIGH (ref 65–99)
Potassium: 3.9 mmol/L (ref 3.5–5.1)
Sodium: 138 mmol/L (ref 135–145)
TOTAL PROTEIN: 7.3 g/dL (ref 6.5–8.1)

## 2017-12-23 LAB — URINALYSIS, ROUTINE W REFLEX MICROSCOPIC
Bilirubin Urine: NEGATIVE
GLUCOSE, UA: 50 mg/dL — AB
HGB URINE DIPSTICK: NEGATIVE
Ketones, ur: NEGATIVE mg/dL
Nitrite: NEGATIVE
PH: 5 (ref 5.0–8.0)
Protein, ur: NEGATIVE mg/dL
Specific Gravity, Urine: 1.025 (ref 1.005–1.030)

## 2017-12-23 NOTE — ED Notes (Signed)
Pt ambulated with steady gait with walker.

## 2017-12-23 NOTE — Discharge Instructions (Signed)
You were seen today for several complaints including chest pain, back pain, numbness.  Your workup is largely reassuring.  Follow-up with cardiology and your primary physician for reevaluation.  If you develop worsening symptoms, any new symptoms, you should be reevaluated.
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
## 2017-12-28 ENCOUNTER — Other Ambulatory Visit: Payer: Self-pay | Admitting: Infectious Disease

## 2017-12-28 DIAGNOSIS — C73 Malignant neoplasm of thyroid gland: Secondary | ICD-10-CM

## 2017-12-30 ENCOUNTER — Encounter (HOSPITAL_COMMUNITY): Payer: Self-pay | Admitting: Emergency Medicine

## 2017-12-30 ENCOUNTER — Other Ambulatory Visit: Payer: Self-pay

## 2017-12-30 ENCOUNTER — Emergency Department (HOSPITAL_COMMUNITY)
Admission: EM | Admit: 2017-12-30 | Discharge: 2017-12-30 | Disposition: A | Discharge status: Home / Self Care | Payer: Medicare Other | Attending: Physician Assistant | Admitting: Physician Assistant

## 2017-12-30 ENCOUNTER — Emergency Department (HOSPITAL_COMMUNITY): Payer: Medicare Other

## 2017-12-30 DIAGNOSIS — J45909 Unspecified asthma, uncomplicated: Secondary | ICD-10-CM | POA: Diagnosis not present

## 2017-12-30 DIAGNOSIS — F17210 Nicotine dependence, cigarettes, uncomplicated: Secondary | ICD-10-CM | POA: Insufficient documentation

## 2017-12-30 DIAGNOSIS — R600 Localized edema: Secondary | ICD-10-CM | POA: Insufficient documentation

## 2017-12-30 DIAGNOSIS — Z79899 Other long term (current) drug therapy: Secondary | ICD-10-CM | POA: Diagnosis not present

## 2017-12-30 DIAGNOSIS — R0789 Other chest pain: Secondary | ICD-10-CM | POA: Diagnosis not present

## 2017-12-30 DIAGNOSIS — J449 Chronic obstructive pulmonary disease, unspecified: Secondary | ICD-10-CM | POA: Diagnosis not present

## 2017-12-30 LAB — CBC
HEMATOCRIT: 32.4 % — AB (ref 36.0–46.0)
Hemoglobin: 10.5 g/dL — ABNORMAL LOW (ref 12.0–15.0)
MCH: 31.6 pg (ref 26.0–34.0)
MCHC: 32.4 g/dL (ref 30.0–36.0)
MCV: 97.6 fL (ref 78.0–100.0)
PLATELETS: 268 10*3/uL (ref 150–400)
RBC: 3.32 MIL/uL — ABNORMAL LOW (ref 3.87–5.11)
RDW: 14.6 % (ref 11.5–15.5)
WBC: 6.8 10*3/uL (ref 4.0–10.5)

## 2017-12-30 LAB — BASIC METABOLIC PANEL
Anion gap: 9 (ref 5–15)
BUN: 19 mg/dL (ref 6–20)
CALCIUM: 8.8 mg/dL — AB (ref 8.9–10.3)
CO2: 23 mmol/L (ref 22–32)
CREATININE: 1.04 mg/dL — AB (ref 0.44–1.00)
Chloride: 104 mmol/L (ref 101–111)
GFR calc Af Amer: >60 mL/min (ref >60)
GFR, EST NON AFRICAN AMERICAN: 55 mL/min — AB (ref >60)
GLUCOSE: 141 mg/dL — AB (ref 65–99)
POTASSIUM: 4.5 mmol/L (ref 3.5–5.1)
SODIUM: 136 mmol/L (ref 135–145)

## 2017-12-30 LAB — I-STAT TROPONIN, ED
TROPONIN I, POC: 0 ng/mL (ref 0.00–0.08)
Troponin i, poc: 0 ng/mL (ref 0.00–0.08)

## 2017-12-30 LAB — BRAIN NATRIURETIC PEPTIDE: B NATRIURETIC PEPTIDE 5: 52.9 pg/mL (ref 0.0–100.0)

## 2017-12-30 NOTE — ED Notes (Signed)
Pt resting with eyes closed at this time.

## 2017-12-30 NOTE — Discharge Instructions (Signed)
You need to call [phone] tomorrow morning and make an appointment to see the cardiologist about your chest pain. If  you need pain control, you will need to see your primary care doctor. Your caregiver has diagnosed you as having chest pain that is not specific for one problem, but does not require admission.  You are at low risk for an acute heart condition or other serious illness. Chest pain comes from many different causes.  SEEK IMMEDIATE MEDICAL ATTENTION IF: You have severe chest pain, especially if the pain is crushing or pressure-like and spreads to the arms, back, neck, or jaw, or if you have sweating, nausea (feeling sick to your stomach), or shortness of breath. THIS IS AN EMERGENCY. Don't wait to see if the pain will go away. Get medical help at once. Call 911 or 0 (operator). DO NOT drive yourself to the hospital.  Your chest pain gets worse and does not go away with rest.  You have an attack of chest pain lasting longer than usual, despite rest and treatment with the medications your caregiver has prescribed.  You wake from sleep with chest pain or shortness of breath.  You feel dizzy or faint.  You have chest pain not typical of your usual pain for which you originally saw your caregiver.

## 2017-12-30 NOTE — ED Provider Notes (Signed)
3:32 PM BP 121/82   Pulse 69   Temp 98.5 F (36.9 C) (Oral)   Resp 15   Ht 5\' 8"  (1.727 m)   Wt 62.6 kg (138 lb)   SpO2 100%   BMI 20.98 kg/m    Patient given in sign out from [place]. SHe has a hx of chronic diffuse pain. Here for intermittent CP since yesterday. Seen for the same complaint last week.  Plan delta troponin, dc and f/u with cardiology.  Patient second troponin negative.  Appears appropriate for discharge at this time.  Follow-up with cardiology closely.    Margarita Mail, PA-C 12/30/17 [id number]    Dorie Rank, MD 01/01/18 [phone]

## 2017-12-30 NOTE — ED Triage Notes (Signed)
Patient arrived by EMS to hallway bed. Patient complaint of lower extremity edema for 2 days and chest pain with a productive cough yellow sputum yesterday. EMS reported administered aspirin 324 mg and 2 nitro SL prior to arrival. Pain currently 3/10 pressure and bilateral leg pain 10/10 achy sore.

## 2017-12-30 NOTE — ED Provider Notes (Signed)
[facility] EMERGENCY DEPARTMENT Provider Note   CSN: [id number] Arrival date & time: 12/30/17  1328     History   Chief Complaint Chief Complaint  Patient presents with  . Leg Swelling  . Chest Pain    HPI [name] is a 66 y.o. female who presents with chest pain and leg swelling. PMH significant for CKD, COPD, current smoker, HIV, neuropathy. She states that she has had intermittent chest pain for a couple of days. She is a poor historian and describes it as just "a pain". She states that she woke up with the pain at [facility] and then it went away and came back after she at lunch around 10:30AM this morning. She also has chest tightness and bilateral foot and ankle swelling. This is new over the past couple days. She denies fever, chills, SOB, cough, wheezing, abdominal pain, vomiting. She has had some nausea. She also has widespread pain and difficulty walking due to neuropathy but this is a chronic issue. She was evaluated on 1/23 for atypical chest pain as well and was recommended to follow up with cardiology as an outpatient. Of note weight is up 8 pounds from a week ago.  HPI  Past Medical History:  Diagnosis Date  . Anxiety   . Arthritis   . Asthma   . Chronic bronchitis   . COPD (chronic obstructive pulmonary disease) ([place])   . Depression   . Fibromyalgia    "I think I got that"  . Headache(784.0)   . HIV (human immunodeficiency virus infection) ([place])    "dx'd ~ 12/2011"  . Lipoma    right forearm  . 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 Pneumonia ~ 2000's   "once"  . Shortness of breath on exertion   . Thyroid cancer s/p total thyroidectomy April2013 02/06/2012   Per patient report.  Recently diagnosed. Awaiting procedure to have thyroid removed.  Under treatment with Dr Janace Hoard .   Marland Kitchen Vaginal discharge 12/19/2015    Patient Active Problem List   Diagnosis Date Noted  . Psychoactive  substance-induced mood disorder ([place]) 11/13/2016  . Vaginal discharge 12/19/2015  . 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 Pain in joint, shoulder region 08/08/2013  . 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 Rib fractures 07/07/2012  . Weight loss 07/07/2012  . Cervical lymphadenopathy 07/07/2012  . Lymphadenopathy 07/07/2012  . Syncope 05/09/2012  . Neck pain, musculoskeletal 05/09/2012  . Pain in joint of right elbow, possible small fracture 05/09/2012  . Tobacco abuse 05/09/2012  . 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 HIV (human immunodeficiency virus infection) ([place]) 02/06/2012  . LOOSE BODY IN KNEE 10/01/2007  . FRACTURE, TOE, RIGHT 10/01/2007  . ANKLE SPRAIN, RIGHT 10/01/2007  . OSTEOCHONDRITIS DISSECANS 06/09/2007  . KNEE PAIN 05/24/2007  . LIPOMA 01/28/2007  . Major depressive disorder, recurrent episode ([place]) 01/28/2007  . Anxiety state 01/28/2007  . TENSION HEADACHE 01/28/2007  . BRONCHITIS, CHRONIC 01/28/2007  . MENOPAUSAL SYNDROME 01/28/2007  . Enthesopathy of hip region 01/28/2007  . PARESTHESIA NOS 01/28/2007    Past Surgical History:  Procedure Laterality Date  . ANTERIOR CERVICAL DECOMP/DISCECTOMY FUSION  05/2009   C3-4; C5-6  . DILATION AND CURETTAGE OF UTERUS    . THYROIDECTOMY  03/11/12   bilaterally  . THYROIDECTOMY  03/11/2012   Procedure: THYROIDECTOMY;  Surgeon: Melissa Montane, MD;  Location: [facility];  Service: ENT;  Laterality: Bilateral;  OB History    No data available       Home Medications    Prior to Admission medications   Medication Sig Start Date End Date Taking? Authorizing Provider  albuterol (PROVENTIL HFA;VENTOLIN HFA) 108 (90 Base) MCG/ACT inhaler Inhale 2 puffs into the lungs every 4 (four) hours as needed for wheezing or shortness of breath. 04/24/17   Cardama, Grayce Sessions, MD  ALPRAZolam Duanne Moron) 0.25 MG tablet Take 0.25 mg by mouth at bedtime as needed for anxiety.    [provider]    atazanavir-cobicistat (EVOTAZ) 300-150 MG tablet Take 1 tablet by mouth daily with breakfast. Swallow whole. Do NOT crush, cut or chew tablet. Take with food. 10/01/17   Truman Hayward, MD  celecoxib (CELEBREX) 100 MG capsule Take 100 mg by mouth 2 (two) times daily as needed for mild pain.    [provider]  citalopram (CELEXA) 20 MG tablet Take 1 tablet (20 mg total) by mouth daily. Patient taking differently: Take 20-40 mg by mouth daily as needed (depression).  09/30/17 09/30/18  Truman Hayward, MD  emtricitabine-tenofovir AF (DESCOVY) 200-25 MG tablet Take 1 tablet by mouth daily. 10/01/17   Truman Hayward, MD  gabapentin (NEURONTIN) 300 MG capsule Take 1 capsule (300 mg total) by mouth 3 (three) times daily. Patient taking differently: Take 300 mg by mouth 2 (two) times daily as needed (nerve pain).  09/30/17   Truman Hayward, MD  hydrOXYzine (ATARAX/VISTARIL) 25 MG tablet Take 1 tablet (25 mg total) by mouth every 8 (eight) hours as needed for itching. 09/30/17   Truman Hayward, MD  megestrol (MEGACE) 20 MG tablet TAKE ONE TABLET BY MOUTH EVERY DAY 10/12/17   Tommy Medal, Lavell Islam, MD  oxyCODONE-acetaminophen (PERCOCET/ROXICET) 5-325 MG tablet Take 1 tablet every 6 (six) hours as needed by mouth. Patient taking differently: Take 1 tablet by mouth every 6 (six) hours as needed for moderate pain.  10/17/17   Ward, Delice Bison, DO  SYNTHROID 150 MCG tablet TAKE ONE TABLET BY MOUTH DAILY 12/28/17   Tommy Medal, Lavell Islam, MD    Family History Family History  Problem Relation Age of Onset  . Heart disease Maternal Uncle     Social History Social History   Tobacco Use  . Smoking status: Current Every Day Smoker    Packs/day: 1.00    Years: 47.00    Pack years: 47.00    Types: Cigarettes  . Smokeless tobacco: Former Systems developer    Quit date: 12/01/1974  . Tobacco comment: currently smokes   Substance Use Topics  . Alcohol use: No    Comment: 03/11/12 "no beer  or wine ~ 01/2012"  . Drug use: No    Comment: 03/11/12 "last drug use early /2013"     Allergies   Sulfa antibiotics   Review of Systems Review of Systems  Constitutional: Negative for chills and fever.  Respiratory: Positive for chest tightness. Negative for cough, shortness of breath and wheezing.   Cardiovascular: Positive for chest pain and leg swelling.  Gastrointestinal: Positive for nausea. Negative for abdominal pain, diarrhea and vomiting.  Genitourinary: Negative for dysuria.  Musculoskeletal: Positive for gait problem and myalgias.  Skin: Negative for wound.  Neurological: Positive for numbness. Negative for syncope, weakness and headaches.  All other systems reviewed and are negative.    Physical Exam Updated Vital Signs BP 104/71   Pulse 82   Temp 98.5 F (36.9 C) (Oral)  Resp 18   Ht 5\' 8"  (1.727 m)   Wt 62.6 kg (138 lb)   SpO2 100%   BMI 20.98 kg/m   Physical Exam  Constitutional: She is oriented to person, place, and time. She appears well-developed and well-nourished. No distress.  HENT:  Head: Normocephalic and atraumatic.  Eyes: Conjunctivae are normal. Pupils are equal, round, and reactive to light. Right eye exhibits no discharge. Left eye exhibits no discharge. No scleral icterus.  Neck: Normal range of motion.  Cardiovascular: Normal rate and regular rhythm. Exam reveals no gallop and no friction rub.  No murmur heard. Pulmonary/Chest: Effort normal and breath sounds normal. No stridor. No respiratory distress. She has no wheezes. She has no rales. She exhibits no tenderness.  Abdominal: She exhibits no distension.  Musculoskeletal:  2+ bilateral peripheral edema of the foot to the distal shins  Neurological: She is alert and oriented to person, place, and time.  Skin: Skin is warm and dry.  Psychiatric: She has a normal mood and affect. Her behavior is normal.  Nursing note and vitals reviewed.    ED Treatments / Results  Labs (all  labs ordered are listed, but only abnormal results are displayed) Labs Reviewed  BASIC METABOLIC PANEL - Abnormal; Notable for the following components:      Result Value   Glucose, Bld 141 (*)    Creatinine, Ser 1.04 (*)    Calcium 8.8 (*)    GFR calc non Af Amer 55 (*)    All other components within normal limits  CBC - Abnormal; Notable for the following components:   RBC 3.32 (*)    Hemoglobin 10.5 (*)    HCT 32.4 (*)    All other components within normal limits  BRAIN NATRIURETIC PEPTIDE  I-STAT TROPONIN, ED    EKG  EKG Interpretation None       Radiology Dg Chest 2 View  Result Date: 12/30/2017 CLINICAL DATA:  Chest pain and cough EXAM: CHEST  2 VIEW COMPARISON:  12/22/2017 FINDINGS: Cardiac shadow is within normal limits. Bilateral nipple shadows are noted. The lungs are well aerated bilaterally without focal infiltrate. Previously seen changes in the right base have resolved. No bony abnormality is noted. IMPRESSION: No acute abnormality seen. Electronically Signed   By: Inez Catalina M.D.   On: 12/30/2017 14:47    Procedures Procedures (including critical care time)  Medications Ordered in ED Medications - No data to display   Initial Impression / Assessment and Plan / ED Course  I have reviewed the triage vital signs and the nursing notes.  Pertinent labs & imaging results that were available during my care of the patient were reviewed by me and considered in my medical decision making (see chart for details).  67 year old female presents with atypical chest pain. Chest pain work up is reassuring. Doubt ACS, PE, pericarditis, esophageal rupture, tension pneumothorax, aortic dissection, cardiac tamponade. EKG is NSR with RBBB. CXR is negative. Initial troponin is 0. CBC is remarkable for anemia which appears at her baseline (although is down from last week). BMP is improved from last week. BNP is normal. HEART score is 3. Shared visit with Dr. Thomasene Lot. Will delta  trop and discharge with cardiology follow up.  Final Clinical Impressions(s) / ED Diagnoses   Final diagnoses:  Atypical chest pain    ED Discharge Orders    None       Recardo Evangelist, PA-C 12/30/17 [place], Courteney Lyn,  MD 12/31/17 2311

## 2017-12-30 NOTE — ED Notes (Signed)
Patient transported to X-ray 

## 2017-12-30 NOTE — ED Notes (Signed)
Pt continues to rest with eyes closed.

## 2018-01-13 ENCOUNTER — Encounter (HOSPITAL_COMMUNITY): Payer: Self-pay | Admitting: Emergency Medicine

## 2018-01-13 ENCOUNTER — Emergency Department (HOSPITAL_COMMUNITY): Payer: Medicare Other

## 2018-01-13 DIAGNOSIS — Z85850 Personal history of malignant neoplasm of thyroid: Secondary | ICD-10-CM | POA: Insufficient documentation

## 2018-01-13 DIAGNOSIS — R609 Edema, unspecified: Secondary | ICD-10-CM | POA: Insufficient documentation

## 2018-01-13 DIAGNOSIS — F17210 Nicotine dependence, cigarettes, uncomplicated: Secondary | ICD-10-CM | POA: Insufficient documentation

## 2018-01-13 DIAGNOSIS — B20 Human immunodeficiency virus [HIV] disease: Secondary | ICD-10-CM | POA: Insufficient documentation

## 2018-01-13 DIAGNOSIS — Z79899 Other long term (current) drug therapy: Secondary | ICD-10-CM | POA: Diagnosis not present

## 2018-01-13 DIAGNOSIS — J45909 Unspecified asthma, uncomplicated: Secondary | ICD-10-CM | POA: Insufficient documentation

## 2018-01-13 DIAGNOSIS — J449 Chronic obstructive pulmonary disease, unspecified: Secondary | ICD-10-CM | POA: Insufficient documentation

## 2018-01-13 DIAGNOSIS — R0789 Other chest pain: Secondary | ICD-10-CM | POA: Insufficient documentation

## 2018-01-13 DIAGNOSIS — G629 Polyneuropathy, unspecified: Secondary | ICD-10-CM | POA: Diagnosis not present

## 2018-01-13 LAB — CBC
HEMATOCRIT: 33.2 % — AB (ref 36.0–46.0)
HEMOGLOBIN: 11 g/dL — AB (ref 12.0–15.0)
MCH: 31.7 pg (ref 26.0–34.0)
MCHC: 33.1 g/dL (ref 30.0–36.0)
MCV: 95.7 fL (ref 78.0–100.0)
Platelets: 310 10*3/uL (ref 150–400)
RBC: 3.47 MIL/uL — ABNORMAL LOW (ref 3.87–5.11)
RDW: 14.5 % (ref 11.5–15.5)
WBC: 6.2 10*3/uL (ref 4.0–10.5)

## 2018-01-13 LAB — BASIC METABOLIC PANEL
ANION GAP: 10 (ref 5–15)
BUN: 21 mg/dL — ABNORMAL HIGH (ref 6–20)
CO2: 21 mmol/L — ABNORMAL LOW (ref 22–32)
Calcium: 9.2 mg/dL (ref 8.9–10.3)
Chloride: 107 mmol/L (ref 101–111)
Creatinine, Ser: 1.1 mg/dL — ABNORMAL HIGH (ref 0.44–1.00)
GFR calc Af Amer: 60 mL/min — ABNORMAL LOW (ref >60)
GFR calc non Af Amer: 52 mL/min — ABNORMAL LOW (ref >60)
GLUCOSE: 123 mg/dL — AB (ref 65–99)
POTASSIUM: 4.2 mmol/L (ref 3.5–5.1)
Sodium: 138 mmol/L (ref 135–145)

## 2018-01-13 LAB — I-STAT TROPONIN, ED: Troponin i, poc: 0 ng/mL (ref 0.00–0.08)

## 2018-01-13 NOTE — ED Triage Notes (Signed)
Per [facility]: Pt to ED from [facility] for recurrent episode L sided, non-radiating CP onset noon yesterday - seen last week for same. Describes as sharp and burning. Given 324 ASA and 1 NTG with minimal relief PTA. BP did drop a little en route following NTG administration, so no more was given. 18g. LAC, EMS VS: 104/70, HR 80, RR 14, 97% RA. Resp e/u, skin warm/dry.

## 2018-01-14 ENCOUNTER — Emergency Department (HOSPITAL_COMMUNITY)
Admission: EM | Admit: 2018-01-14 | Discharge: 2018-01-14 | Disposition: A | Discharge status: Home / Self Care | Payer: Medicare Other | Attending: Emergency Medicine | Admitting: Emergency Medicine

## 2018-01-14 DIAGNOSIS — R0789 Other chest pain: Secondary | ICD-10-CM | POA: Diagnosis not present

## 2018-01-14 DIAGNOSIS — R609 Edema, unspecified: Secondary | ICD-10-CM

## 2018-01-14 LAB — I-STAT TROPONIN, ED: Troponin i, poc: 0 ng/mL (ref 0.00–0.08)

## 2018-01-14 LAB — BRAIN NATRIURETIC PEPTIDE: B NATRIURETIC PEPTIDE 5: 18.6 pg/mL (ref 0.0–100.0)

## 2018-01-14 NOTE — Discharge Instructions (Signed)

## 2018-01-14 NOTE — ED Provider Notes (Signed)
[facility] EMERGENCY DEPARTMENT Provider Note   CSN: [id number] Arrival date & time: 01/13/18  2232     History   Chief Complaint Chief Complaint  Patient presents with  . Chest Pain    HPI [name] is a 67 y.o. female with a pmh of HIV neuropathy and chronic pain who presents with chest pain. Her last cd4 count 1120, viral load undetectable in 08/2017 and she states that she is compliant with her medications. She co cp on the left that radiates to her back. It is intermittent and non exertional. She denies nausea, or diaphoresis. She also C/O burning in her hands and legs and swelling in both her lower extremities. She denies fevers. She has no cp at this.  HPI  Past Medical History:  Diagnosis Date  . Anxiety   . Arthritis   . Asthma   . Chronic bronchitis   . COPD (chronic obstructive pulmonary disease) ([place])   . Depression   . Fibromyalgia    "I think I got that"  . Headache(784.0)   . HIV (human immunodeficiency virus infection) ([place])    "dx'd ~ 12/2011"  . Lipoma    right forearm  . 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 Papillary carcinoma of thyroid ([place]) 03/11/12  . Pneumonia ~ 2000's   "once"  . Shortness of breath on exertion   . Thyroid cancer s/p total thyroidectomy April2013 02/06/2012   Per patient report.  Recently diagnosed. Awaiting procedure to have thyroid removed.  Under treatment with Dr Janace Hoard .   Marland Kitchen Vaginal discharge 12/19/2015    Patient Active Problem List   Diagnosis Date Noted  . Psychoactive substance-induced mood disorder ([place]) 11/13/2016  . Vaginal discharge 12/19/2015  . 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 Pain in joint, shoulder region 08/08/2013  . 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 Rib fractures 07/07/2012  . Weight loss 07/07/2012  . Cervical lymphadenopathy 07/07/2012  . Lymphadenopathy 07/07/2012  . Syncope 05/09/2012  . Neck pain, musculoskeletal 05/09/2012  . Pain in joint of right  elbow, possible small fracture 05/09/2012  . Tobacco abuse 05/09/2012  . 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 HIV (human immunodeficiency virus infection) ([place]) 02/06/2012  . LOOSE BODY IN KNEE 10/01/2007  . FRACTURE, TOE, RIGHT 10/01/2007  . ANKLE SPRAIN, RIGHT 10/01/2007  . OSTEOCHONDRITIS DISSECANS 06/09/2007  . KNEE PAIN 05/24/2007  . LIPOMA 01/28/2007  . Major depressive disorder, recurrent episode ([place]) 01/28/2007  . Anxiety state 01/28/2007  . TENSION HEADACHE 01/28/2007  . BRONCHITIS, CHRONIC 01/28/2007  . MENOPAUSAL SYNDROME 01/28/2007  . Enthesopathy of hip region 01/28/2007  . PARESTHESIA NOS 01/28/2007    Past Surgical History:  Procedure Laterality Date  . ANTERIOR CERVICAL DECOMP/DISCECTOMY FUSION  05/2009   C3-4; C5-6  . DILATION AND CURETTAGE OF UTERUS    . THYROIDECTOMY  03/11/12   bilaterally  . THYROIDECTOMY  03/11/2012   Procedure: THYROIDECTOMY;  Surgeon: Melissa Montane, MD;  [facility];  Service: ENT;  Laterality: Bilateral;    OB History    No data available       Home Medications    Prior to Admission medications   Medication Sig Start Date End Date Taking? Authorizing Provider  albuterol (PROVENTIL HFA;VENTOLIN HFA) 108 (90 Base) MCG/ACT inhaler Inhale 2 puffs into the lungs every 4 (four) hours as needed for wheezing or shortness of breath. 04/24/17   Cardama, Grayce Sessions, MD  ALPRAZolam Duanne Moron) 0.25 MG  tablet Take 0.25 mg by mouth at bedtime as needed for anxiety.    [provider]  atazanavir-cobicistat (EVOTAZ) 300-150 MG tablet Take 1 tablet by mouth daily with breakfast. Swallow whole. Do NOT crush, cut or chew tablet. Take with food. 10/01/17   Truman Hayward, MD  celecoxib (CELEBREX) 100 MG capsule Take 100 mg by mouth 2 (two) times daily as needed for mild pain.    [provider]  citalopram (CELEXA) 20 MG tablet Take 1 tablet (20 mg total) by mouth daily. Patient taking differently: Take  20-40 mg by mouth daily as needed (depression).  09/30/17 09/30/18  Truman Hayward, MD  emtricitabine-tenofovir AF (DESCOVY) 200-25 MG tablet Take 1 tablet by mouth daily. 10/01/17   Truman Hayward, MD  gabapentin (NEURONTIN) 300 MG capsule Take 1 capsule (300 mg total) by mouth 3 (three) times daily. Patient taking differently: Take 300 mg by mouth 2 (two) times daily as needed (nerve pain).  09/30/17   Truman Hayward, MD  hydrOXYzine (ATARAX/VISTARIL) 25 MG tablet Take 1 tablet (25 mg total) by mouth every 8 (eight) hours as needed for itching. 09/30/17   Truman Hayward, MD  megestrol (MEGACE) 20 MG tablet TAKE ONE TABLET BY MOUTH EVERY DAY 10/12/17   Tommy Medal, Lavell Islam, MD  naproxen sodium (ALEVE) 220 MG tablet Take 220 mg by mouth as needed.    [provider]  oxyCODONE-acetaminophen (PERCOCET/ROXICET) 5-325 MG tablet Take 1 tablet every 6 (six) hours as needed by mouth. Patient taking differently: Take 1 tablet by mouth every 6 (six) hours as needed for moderate pain.  10/17/17   Ward, Delice Bison, DO  SYNTHROID 150 MCG tablet TAKE ONE TABLET BY MOUTH DAILY 12/28/17   Tommy Medal, Lavell Islam, MD    Family History Family History  Problem Relation Age of Onset  . Heart disease Maternal Uncle     Social History Social History   Tobacco Use  . Smoking status: Current Every Day Smoker    Packs/day: 1.00    Years: 47.00    Pack years: 47.00    Types: Cigarettes  . Smokeless tobacco: Former Systems developer    Quit date: 12/01/1974  . Tobacco comment: currently smokes   Substance Use Topics  . Alcohol use: No    Comment: 03/11/12 "no beer or wine ~ 01/2012"  . Drug use: No    Comment: 03/11/12 "last drug use early /2013"     Allergies   Sulfa antibiotics   Review of Systems Review of Systems Ten systems reviewed and are negative for acute change, except as noted in the HPI.   Physical Exam Updated Vital Signs BP 104/73   Pulse 80   Temp 98.4 F (36.9 C)  (Oral)   Resp 17   SpO2 98%   Physical Exam  Constitutional: She is oriented to person, place, and time. She appears well-developed and well-nourished. No distress.  HENT:  Head: Normocephalic and atraumatic.  Eyes: Conjunctivae are normal. No scleral icterus.  Neck: Normal range of motion.  Cardiovascular: Normal rate, regular rhythm, normal heart sounds, intact distal pulses and normal pulses. Exam reveals no gallop and no friction rub.  No murmur heard. Pulmonary/Chest: Effort normal and breath sounds normal. No respiratory distress.  Abdominal: Soft. Bowel sounds are normal. She exhibits no distension and no mass. There is no tenderness. There is no guarding.  Musculoskeletal:       Right lower leg: She  exhibits edema.       Left lower leg: She exhibits edema.  BL lower extremity peripheral edema and stasis dermatitis.  Neurological: She is alert and oriented to person, place, and time.  Skin: Skin is warm and dry. She is not diaphoretic.  Psychiatric: Her behavior is normal.  Nursing note and vitals reviewed.    ED Treatments / Results  Labs (all labs ordered are listed, but only abnormal results are displayed) Labs Reviewed  BASIC METABOLIC PANEL - Abnormal; Notable for the following components:      Result Value   CO2 21 (*)    Glucose, Bld 123 (*)    BUN 21 (*)    Creatinine, Ser 1.10 (*)    GFR calc non Af Amer 52 (*)    GFR calc Af Amer 60 (*)    All other components within normal limits  CBC - Abnormal; Notable for the following components:   RBC 3.47 (*)    Hemoglobin 11.0 (*)    HCT 33.2 (*)    All other components within normal limits  I-STAT TROPONIN, ED    EKG  EKG Interpretation  Date/Time:  Wednesday January 13 2018 22:54:09 EST Ventricular Rate:  80 PR Interval:  178 QRS Duration: 120 QT Interval:  402 QTC Calculation: 463 R Axis:   129 Text Interpretation:  Normal sinus rhythm Right bundle branch block Abnormal ECG When compared with ECG of  12/30/2017, No significant change was found Confirmed by Delora Fuel ([id number]) on 01/14/2018 5:14:36 AM       Radiology Dg Chest 2 View  Result Date: 01/13/2018 CLINICAL DATA:  Recurrent LEFT-sided chest pain. EXAM: CHEST  2 VIEW COMPARISON:  Chest radiograph December 30, 2017 FINDINGS: Cardiac silhouette is mildly enlarged and unchanged. Mediastinal silhouette is non suspicious. Mild chronic interstitial changes and hyperinflation without pleural effusion or focal consolidation. Trace bibasilar scarring. ACDF. Coarse densities projecting over RIGHT humerus, likely external to the patient as new from prior radiograph. Osteopenia. IMPRESSION: Mild cardiomegaly and COPD. Electronically Signed   By: Elon Alas M.D.   On: 01/13/2018 23:18    Procedures Procedures (including critical care time)  Medications Ordered in ED Medications - No data to display   Initial Impression / Assessment and Plan / ED Course  I have reviewed the triage vital signs and the nursing notes.  Pertinent labs & imaging results that were available during my care of the patient were reviewed by me and considered in my medical decision making (see chart for details).     Patient with atypical cp. No sig changes on her EKG from previous. Delta troponin negative. No evidence of CHF- likely venous insufficiency.  HEART score 3.  Appears appropriate for discharge and close OP follow up.  Final Clinical Impressions(s) / ED Diagnoses   Final diagnoses:  Atypical chest pain  Peripheral edema    ED Discharge Orders    None       Margarita Mail, PA-C 01/14/18 0615    Ward, Delice Bison, DO 01/14/18 [phone]

## 2018-01-26 ENCOUNTER — Emergency Department (HOSPITAL_COMMUNITY): Payer: Medicare Other

## 2018-01-26 ENCOUNTER — Encounter (HOSPITAL_COMMUNITY): Payer: Self-pay | Admitting: *Deleted

## 2018-01-26 ENCOUNTER — Other Ambulatory Visit: Payer: Self-pay

## 2018-01-26 ENCOUNTER — Emergency Department (HOSPITAL_COMMUNITY)
Admission: EM | Admit: 2018-01-26 | Discharge: 2018-01-26 | Disposition: A | Discharge status: Home / Self Care | Payer: Medicare Other | Attending: Emergency Medicine | Admitting: Emergency Medicine

## 2018-01-26 DIAGNOSIS — B20 Human immunodeficiency virus [HIV] disease: Secondary | ICD-10-CM | POA: Diagnosis not present

## 2018-01-26 DIAGNOSIS — R0789 Other chest pain: Secondary | ICD-10-CM | POA: Insufficient documentation

## 2018-01-26 DIAGNOSIS — R0602 Shortness of breath: Secondary | ICD-10-CM | POA: Diagnosis not present

## 2018-01-26 DIAGNOSIS — R531 Weakness: Secondary | ICD-10-CM | POA: Diagnosis present

## 2018-01-26 DIAGNOSIS — J449 Chronic obstructive pulmonary disease, unspecified: Secondary | ICD-10-CM | POA: Insufficient documentation

## 2018-01-26 DIAGNOSIS — B349 Viral infection, unspecified: Secondary | ICD-10-CM | POA: Diagnosis not present

## 2018-01-26 DIAGNOSIS — F17210 Nicotine dependence, cigarettes, uncomplicated: Secondary | ICD-10-CM | POA: Insufficient documentation

## 2018-01-26 DIAGNOSIS — Z85850 Personal history of malignant neoplasm of thyroid: Secondary | ICD-10-CM | POA: Diagnosis not present

## 2018-01-26 DIAGNOSIS — R05 Cough: Secondary | ICD-10-CM | POA: Diagnosis not present

## 2018-01-26 DIAGNOSIS — Z79899 Other long term (current) drug therapy: Secondary | ICD-10-CM | POA: Diagnosis not present

## 2018-01-26 LAB — BASIC METABOLIC PANEL
Anion gap: 7 (ref 5–15)
BUN: 19 mg/dL (ref 6–20)
CALCIUM: 9.5 mg/dL (ref 8.9–10.3)
CO2: 26 mmol/L (ref 22–32)
Chloride: 107 mmol/L (ref 101–111)
Creatinine, Ser: 1.07 mg/dL — ABNORMAL HIGH (ref 0.44–1.00)
GFR, EST NON AFRICAN AMERICAN: 53 mL/min — AB (ref >60)
Glucose, Bld: 107 mg/dL — ABNORMAL HIGH (ref 65–99)
Potassium: 4.1 mmol/L (ref 3.5–5.1)
SODIUM: 140 mmol/L (ref 135–145)

## 2018-01-26 LAB — I-STAT TROPONIN, ED: Troponin i, poc: 0 ng/mL (ref 0.00–0.08)

## 2018-01-26 LAB — CBC
HCT: 33.1 % — ABNORMAL LOW (ref 36.0–46.0)
Hemoglobin: 11 g/dL — ABNORMAL LOW (ref 12.0–15.0)
MCH: 31.8 pg (ref 26.0–34.0)
MCHC: 33.2 g/dL (ref 30.0–36.0)
MCV: 95.7 fL (ref 78.0–100.0)
Platelets: 277 10*3/uL (ref 150–400)
RBC: 3.46 MIL/uL — ABNORMAL LOW (ref 3.87–5.11)
RDW: 13.9 % (ref 11.5–15.5)
WBC: 7.7 10*3/uL (ref 4.0–10.5)

## 2018-01-26 MED ORDER — IBUPROFEN 800 MG PO TABS
800.0000 mg | ORAL_TABLET | Freq: Once | ORAL | Status: AC
  Administered 2018-01-26: 800 mg via ORAL
  Filled 2018-01-26: qty 1

## 2018-01-26 NOTE — ED Triage Notes (Signed)
Per EMS, pt from home, pt reports generalized weakness which worsened yesterday.  Pt endorses non-radiating "nagging" chest discomfort.  Denies SOB or n/v.  Pt is A&*Ox 4.

## 2018-01-26 NOTE — ED Provider Notes (Signed)
[facility] DEPT Provider Note   CSN: [id number] Arrival date & time: 01/26/18  1513     History   Chief Complaint No chief complaint on file.   HPI [name] is a 67 y.o. female.  The history is provided by the patient. No language interpreter was used.  Cough  This is a new problem. The problem occurs constantly. The problem has been gradually worsening. The cough is non-productive. There has been no fever. The fever has been present for less than 1 day. Associated symptoms include shortness of breath. She has tried nothing for the symptoms. The treatment provided no relief. She is not a smoker.  Pt complains of a cough and congestion. Pt complains of bodyaches.  Pt complains of chest sorenessPt reports she lives in the shelter.  She has been exposed to several people who have been sick.    Past Medical History:  Diagnosis Date  . Anxiety   . Arthritis   . Asthma   . Chronic bronchitis   . COPD (chronic obstructive pulmonary disease) ([place])   . Depression   . Fibromyalgia    "I think I got that"  . Headache(784.0)   . HIV (human immunodeficiency virus infection) ([place])    "dx'd ~ 12/2011"  . Lipoma    right forearm  . 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 Pneumonia ~ 2000's   "once"  . Shortness of breath on exertion   . Thyroid cancer s/p total thyroidectomy April2013 02/06/2012   Per patient report.  Recently diagnosed. Awaiting procedure to have thyroid removed.  Under treatment with Dr Janace Hoard .   Marland Kitchen Vaginal discharge 12/19/2015    Patient Active Problem List   Diagnosis Date Noted  . Psychoactive substance-induced mood disorder ([place]) 11/13/2016  . Vaginal discharge 12/19/2015  . 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 Pain in joint, shoulder region 08/08/2013  . 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 Rib fractures 07/07/2012  . Weight loss 07/07/2012  . Cervical  lymphadenopathy 07/07/2012  . Lymphadenopathy 07/07/2012  . Syncope 05/09/2012  . Neck pain, musculoskeletal 05/09/2012  . Pain in joint of right elbow, possible small fracture 05/09/2012  . Tobacco abuse 05/09/2012  . 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 HIV (human immunodeficiency virus infection) ([place]) 02/06/2012  . LOOSE BODY IN KNEE 10/01/2007  . FRACTURE, TOE, RIGHT 10/01/2007  . ANKLE SPRAIN, RIGHT 10/01/2007  . OSTEOCHONDRITIS DISSECANS 06/09/2007  . KNEE PAIN 05/24/2007  . LIPOMA 01/28/2007  . Major depressive disorder, recurrent episode ([place]) 01/28/2007  . Anxiety state 01/28/2007  . TENSION HEADACHE 01/28/2007  . BRONCHITIS, CHRONIC 01/28/2007  . MENOPAUSAL SYNDROME 01/28/2007  . Enthesopathy of hip region 01/28/2007  . PARESTHESIA NOS 01/28/2007    Past Surgical History:  Procedure Laterality Date  . ANTERIOR CERVICAL DECOMP/DISCECTOMY FUSION  05/2009   C3-4; C5-6  . DILATION AND CURETTAGE OF UTERUS    . THYROIDECTOMY  03/11/12   bilaterally  . THYROIDECTOMY  03/11/2012   Procedure: THYROIDECTOMY;  Surgeon: Melissa Montane, MD;  Location: [facility];  Service: ENT;  Laterality: Bilateral;    OB History    No data available       Home Medications    Prior to Admission medications   Medication Sig Start Date End Date Taking? Authorizing Provider  atazanavir-cobicistat (EVOTAZ) 300-150 MG tablet Take 1 tablet by mouth daily with breakfast. Swallow whole. Do NOT crush,  cut or chew tablet. Take with food. 10/01/17  Yes Tommy Medal, Lavell Islam, MD  emtricitabine-tenofovir AF (DESCOVY) 200-25 MG tablet Take 1 tablet by mouth daily. 10/01/17  Yes Tommy Medal, Lavell Islam, MD  ibuprofen (ADVIL,MOTRIN) 600 MG tablet Take 600 mg by mouth every 8 (eight) hours as needed for moderate pain.  01/04/18  Yes [provider]  naproxen sodium (ALEVE) 220 MG tablet Take 220 mg by mouth 2 (two) times daily as needed (pain).    Yes [provider]  SYNTHROID  150 MCG tablet TAKE ONE TABLET BY MOUTH DAILY 12/28/17  Yes Tommy Medal, Lavell Islam, MD  albuterol (PROVENTIL HFA;VENTOLIN HFA) 108 [phone] Base) MCG/ACT inhaler Inhale 2 puffs into the lungs every 4 (four) hours as needed for wheezing or shortness of breath. 04/24/17   Cardama, Grayce Sessions, MD  citalopram (CELEXA) 20 MG tablet Take 1 tablet (20 mg total) by mouth daily. Patient taking differently: Take 20-40 mg by mouth daily as needed (depression).  09/30/17 09/30/18  Truman Hayward, MD  gabapentin (NEURONTIN) 300 MG capsule Take 1 capsule (300 mg total) by mouth 3 (three) times daily. Patient taking differently: Take 300 mg by mouth 2 (two) times daily as needed (nerve pain).  09/30/17   Truman Hayward, MD  hydrOXYzine (ATARAX/VISTARIL) 25 MG tablet Take 1 tablet (25 mg total) by mouth every 8 (eight) hours as needed for itching. Patient not taking: Reported on 01/26/2018 09/30/17   Tommy Medal, Lavell Islam, MD  megestrol (MEGACE) 20 MG tablet TAKE ONE TABLET BY MOUTH EVERY DAY Patient not taking: Reported on 01/26/2018 10/12/17   Tommy Medal, Lavell Islam, MD  oxyCODONE-acetaminophen (PERCOCET/ROXICET) 5-325 MG tablet Take 1 tablet every 6 (six) hours as needed by mouth. Patient not taking: Reported on 01/26/2018 10/17/17   Ward, Delice Bison, DO    Family History Family History  Problem Relation Age of Onset  . Heart disease Maternal Uncle     Social History Social History   Tobacco Use  . Smoking status: Current Every Day Smoker    Packs/day: 0.50    Years: 47.00    Pack years: 23.50    Types: Cigarettes  . Smokeless tobacco: Former Systems developer    Quit date: 12/01/1974  . Tobacco comment: currently smokes   Substance Use Topics  . Alcohol use: No    Comment: 03/11/12 "no beer or wine ~ 01/2012"  . Drug use: No    Comment: 03/11/12 "last drug use early /2013"     Allergies   Sulfa antibiotics   Review of Systems Review of Systems  Respiratory: Positive for cough and shortness of breath.    All other systems reviewed and are negative.    Physical Exam Updated Vital Signs BP 111/85 (BP Location: Left Arm)   Pulse 73   Temp 98.7 F (37.1 C) (Oral)   Resp 17   SpO2 99%   Physical Exam  Constitutional: She appears well-developed and well-nourished. No distress.  HENT:  Head: Normocephalic and atraumatic.  Eyes: Conjunctivae are normal.  Neck: Neck supple.  Cardiovascular: Normal rate and regular rhythm.  No murmur heard. Pulmonary/Chest: Effort normal and breath sounds normal. No respiratory distress.  Abdominal: Soft. There is no tenderness.  Musculoskeletal: She exhibits no edema.  Neurological: She is alert.  Skin: Skin is warm and dry.  Psychiatric: She has a normal mood and affect.  Nursing note and vitals reviewed.    ED Treatments / Results  Labs (all  labs ordered are listed, but only abnormal results are displayed) Labs Reviewed  BASIC METABOLIC PANEL - Abnormal; Notable for the following components:      Result Value   Glucose, Bld 107 (*)    Creatinine, Ser 1.07 (*)    GFR calc non Af Amer 53 (*)    All other components within normal limits  CBC - Abnormal; Notable for the following components:   RBC 3.46 (*)    Hemoglobin 11.0 (*)    HCT 33.1 (*)    All other components within normal limits  I-STAT TROPONIN, ED    EKG  EKG Interpretation None       Radiology Dg Chest 2 View  Result Date: 01/26/2018 CLINICAL DATA:  Generalized body ache with left-sided chest discomfort EXAM: CHEST  2 VIEW COMPARISON:  01/13/2018, 12/30/2017, 12/22/2017, 08/10/2014 FINDINGS: Hyperinflation with subsegmental atelectasis at the right base. No consolidation or effusion. Normal cardiomediastinal silhouette. No pneumothorax. Degenerative changes of the spine. Linear artifact over the right lower chest. IMPRESSION: Hyperinflation with subsegmental atelectasis at the right base. No focal pulmonary infiltrate. Electronically Signed   By: Donavan Foil M.D.   On:  01/26/2018 16:40    Procedures Procedures (including critical care time)  Medications Ordered in ED Medications - No data to display   Initial Impression / Assessment and Plan / ED Course  I have reviewed the triage vital signs and the nursing notes.  Pertinent labs & imaging results that were available during my care of the patient were reviewed by me and considered in my medical decision making (see chart for details).     MDM  Probable viral illness.  No pneumonia.  Pt has had symptoms to long for tamiflu to be beneficial.    Final Clinical Impressions(s) / ED Diagnoses   Final diagnoses:  Viral illness    ED Discharge Orders    None     Return if any problems.  See your Physician for recheck in 2-3 days if symptoms persist   Sidney Ace 01/26/18 Brooke Bonito, MD 01/30/18 [phone]

## 2018-01-26 NOTE — ED Triage Notes (Signed)
Pt reports she stays at the shelter and there have been a lot of the people there who are sick.  She reports feeling generalized body aches, L side chest discomfort and warm feeling.  Sxs onset was Saturday.  She endorses bila LE pain and swelling in the RLE.  Pt is A&Ox 4.

## 2018-01-26 NOTE — ED Notes (Signed)
Patient given graham crackers before being given ibuprofen.

## 2018-02-08 ENCOUNTER — Encounter (INDEPENDENT_AMBULATORY_CARE_PROVIDER_SITE_OTHER): Payer: Self-pay | Admitting: Orthopedic Surgery

## 2018-02-08 ENCOUNTER — Ambulatory Visit (INDEPENDENT_AMBULATORY_CARE_PROVIDER_SITE_OTHER): Payer: Medicare Other | Admitting: Orthopedic Surgery

## 2018-02-08 VITALS — Ht 68.0 in | Wt 138.0 lb

## 2018-02-08 DIAGNOSIS — M87052 Idiopathic aseptic necrosis of left femur: Secondary | ICD-10-CM | POA: Diagnosis not present

## 2018-02-08 DIAGNOSIS — M87051 Idiopathic aseptic necrosis of right femur: Secondary | ICD-10-CM | POA: Diagnosis not present

## 2018-02-08 NOTE — Progress Notes (Signed)
Office Visit Note   Patient: [name]           Date of Birth: August 03, 1951           MRN: [id number] Visit Date: 02/08/2018              Requested by: Elwyn Reach, MD [address] [place], [place] [address] PCP: Elwyn Reach, MD  Chief Complaint  Patient presents with  . Right Hip - Pain  . Left Hip - Pain      HPI: Patient is a 67 year old woman HIV positive with avascular necrosis of both hips.  Patient states she has a unsteady gait she uses a walker.  She states she is been symptomatic for over a year.  Assessment & Plan: Visit Diagnoses:  1. Avascular necrosis of hip, left ([place])   2. Avascular necrosis of hip, right ([facility])     Plan: Discussed with the patient and her only option for her hips would be bilateral total hip arthroplasties.  We will have patient follow-up with Dr. Ninfa Linden for consultation regarding total hip arthroplasty.  Follow-Up Instructions: Return in about 3 weeks (around 03/01/2018).   Ortho Exam  Patient is alert, oriented, no adenopathy, well-dressed, normal affect, normal respiratory effort. Examination patient has difficulty getting from a sitting to a standing position she has an antalgic gait uses a walker.  Patient has essentially no internal or external rotation of her hips.  Her MRI scan is reviewed which shows complete collapse of the femoral heads bilaterally with avascular necrosis involving the entire femoral head.  There is involvement down to the middle of the femoral neck.  Imaging: No results found. No images are attached to the encounter.  Labs: Lab Results  Component Value Date   REPTSTATUS 04/11/2017 FINAL 04/09/2017   GRAMSTAIN  08/23/2011    NO WBC SEEN NO SQUAMOUS EPITHELIAL CELLS SEEN NO ORGANISMS SEEN   CULT MULTIPLE SPECIES PRESENT, SUGGEST RECOLLECTION (A) 04/09/2017    @LABSALLVALUES (HGBA1)@  Body mass index is 20.98 kg/m.  Orders:  No orders of the defined types were placed in this  encounter.  No orders of the defined types were placed in this encounter.    Procedures: No procedures performed  Clinical Data: No additional findings.  ROS:  All other systems negative, except as noted in the HPI. Review of Systems  Objective: Vital Signs: Ht 5\' 8"  (1.727 m)   Wt 138 lb (62.6 kg)   BMI 20.98 kg/m   Specialty Comments:  No specialty comments available.  PMFS History: Patient Active Problem List   Diagnosis Date Noted  . Avascular necrosis of hip, left ([place]) 02/08/2018  . Avascular necrosis of hip, right ([place]) 02/08/2018  . Psychoactive substance-induced mood disorder ([place]) 11/13/2016  . Vaginal discharge 12/19/2015  . 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 Pain in joint, shoulder region 08/08/2013  . 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 Rib fractures 07/07/2012  . Weight loss 07/07/2012  . Cervical lymphadenopathy 07/07/2012  . Lymphadenopathy 07/07/2012  . Syncope 05/09/2012  . Neck pain, musculoskeletal 05/09/2012  . Pain in joint of right elbow, possible small fracture 05/09/2012  . Tobacco abuse 05/09/2012  . 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 HIV (human immunodeficiency virus infection) ([place]) 02/06/2012  . LOOSE BODY IN KNEE 10/01/2007  . FRACTURE, TOE, RIGHT 10/01/2007  . ANKLE SPRAIN, RIGHT 10/01/2007  . OSTEOCHONDRITIS DISSECANS 06/09/2007  . KNEE PAIN 05/24/2007  . LIPOMA 01/28/2007  .  Major depressive disorder, recurrent episode ([place]) 01/28/2007  . Anxiety state 01/28/2007  . TENSION HEADACHE 01/28/2007  . BRONCHITIS, CHRONIC 01/28/2007  . MENOPAUSAL SYNDROME 01/28/2007  . Enthesopathy of hip region 01/28/2007  . PARESTHESIA NOS 01/28/2007   Past Medical History:  Diagnosis Date  . Anxiety   . Arthritis   . Asthma   . Chronic bronchitis   . COPD (chronic obstructive pulmonary disease) ([place])   . Depression   . Fibromyalgia    "I think I got that"  . Headache(784.0)   . HIV (human  immunodeficiency virus infection) ([place])    "dx'd ~ 12/2011"  . Lipoma    right forearm  . 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 Papillary carcinoma of thyroid ([place]) 03/11/12  . Pneumonia ~ 2000's   "once"  . Shortness of breath on exertion   . Thyroid cancer s/p total thyroidectomy April2013 02/06/2012   Per patient report.  Recently diagnosed. Awaiting procedure to have thyroid removed.  Under treatment with Dr Janace Hoard .   Marland Kitchen Vaginal discharge 12/19/2015    Family History  Problem Relation Age of Onset  . Heart disease Maternal Uncle     Past Surgical History:  Procedure Laterality Date  . ANTERIOR CERVICAL DECOMP/DISCECTOMY FUSION  05/2009   C3-4; C5-6  . DILATION AND CURETTAGE OF UTERUS    . THYROIDECTOMY  03/11/12   bilaterally  . THYROIDECTOMY  03/11/2012   Procedure: THYROIDECTOMY;  Surgeon: Melissa Montane, MD;  Location: [facility];  Service: ENT;  Laterality: Bilateral;   Social History   Occupational History  . Not on file  Tobacco Use  . Smoking status: Current Every Day Smoker    Packs/day: 0.50    Years: 47.00    Pack years: 23.50    Types: Cigarettes  . Smokeless tobacco: Former Systems developer    Quit date: 12/01/1974  . Tobacco comment: currently smokes   Substance and Sexual Activity  . Alcohol use: No    Comment: 03/11/12 "no beer or wine ~ 01/2012"  . Drug use: No    Comment: 03/11/12 "last drug use early /2013"  . Sexual activity: Not Currently    Partners: Male    Birth control/protection: None, Post-menopausal

## 2018-02-23 ENCOUNTER — Ambulatory Visit: Payer: Self-pay | Admitting: Infectious Disease

## 2018-02-24 ENCOUNTER — Other Ambulatory Visit: Payer: Self-pay | Admitting: *Deleted

## 2018-02-24 ENCOUNTER — Other Ambulatory Visit: Payer: Self-pay | Admitting: Infectious Disease

## 2018-02-24 DIAGNOSIS — C73 Malignant neoplasm of thyroid gland: Secondary | ICD-10-CM

## 2018-02-24 MED ORDER — ATAZANAVIR-COBICISTAT 300-150 MG PO TABS: 1.0000 | ORAL_TABLET | Freq: Every day | ORAL | 5 refills | Status: DC

## 2018-02-24 MED ORDER — SYNTHROID 150 MCG PO TABS: 150.0000 ug | ORAL_TABLET | Freq: Every day | ORAL | 2 refills | Status: DC

## 2018-02-24 MED ORDER — EMTRICITABINE-TENOFOVIR AF 200-25 MG PO TABS: 1.0000 | ORAL_TABLET | Freq: Every day | ORAL | 5 refills | Status: DC

## 2018-02-26 ENCOUNTER — Other Ambulatory Visit: Payer: Self-pay | Admitting: Infectious Disease

## 2018-03-01 ENCOUNTER — Encounter (INDEPENDENT_AMBULATORY_CARE_PROVIDER_SITE_OTHER): Payer: Self-pay | Admitting: Orthopaedic Surgery

## 2018-03-01 ENCOUNTER — Ambulatory Visit (INDEPENDENT_AMBULATORY_CARE_PROVIDER_SITE_OTHER): Payer: Medicare Other

## 2018-03-01 ENCOUNTER — Ambulatory Visit (INDEPENDENT_AMBULATORY_CARE_PROVIDER_SITE_OTHER): Payer: Medicare Other | Admitting: Orthopaedic Surgery

## 2018-03-01 DIAGNOSIS — M87052 Idiopathic aseptic necrosis of left femur: Secondary | ICD-10-CM | POA: Diagnosis not present

## 2018-03-01 DIAGNOSIS — M87051 Idiopathic aseptic necrosis of right femur: Secondary | ICD-10-CM | POA: Diagnosis not present

## 2018-03-01 NOTE — Progress Notes (Signed)
Office Visit Note   Patient: [name]           Date of Birth: Sep 17, 1951           MRN: [id number] Visit Date: 03/01/2018              Requested by: Elwyn Reach, MD [place], [place] [id number] PCP: Elwyn Reach, MD   Assessment & Plan: Visit Diagnoses:  1. Avascular necrosis of hip, right ([place])   2. Avascular necrosis of hip, left ([place])     Plan: I showed the x-rays to her and her sister in length and she does have bilateral hip femoral head collapse.  We will proceed with a left total hip arthroplasty in the near future and then a right 1 about 6-8 weeks after that.  She does have a history of cocaine abuse and did use cocaine last week.  I stressed her the importance of not using drugs at all.  I told her if there is any cocaine in her system with a urine drug screen before surgery we would have to cancel this.  She is staying with her sister now and her sister says she can help keep her drug free.  We a long and thorough discussion about surgery including the risk and benefit assessment.  We talked about her intraoperative and postoperative course and showed her x-rays and a handout on hip replacement surgery.  Given the severity of her femoral head collapse would proceed with a left side first and hopefully in the near future her right hip that will hopefully help with her quality of life which is important.  I did review her notes from infectious disease in her HIV status is under good control.  We would then see her back in 2 weeks postoperative.  All questions concerns were answered and addressed.  Follow-Up Instructions: Return for 2 weeks post-op.   Orders:  Orders Placed This Encounter  Procedures  . XR Pelvis 1-2 Views   No orders of the defined types were placed in this encounter.     Procedures: No procedures performed   Clinical Data: No additional findings.   Subjective: Chief Complaint  Patient presents with  . Right Hip  - Pain  . Left Hip - Pain  The patient comes in today referred from my partner Dr. Sharol Given to evaluate bilateral hip avascular necrosis apparently has femoral head collapse as well.  I do not have the disc of her MRI but apparently it showed significant disease in both of her hips.  Were going obtain plain films today.  She is mainly wheelchair-bound but does get around with a walker.  She has a lot of balance and fall issues.  She is HIV positive but reports an undetectable viral load and normal CD4 count.  Her pain is daily and is 10 out of 10.  At this point it is detrimentally affect her active daily living, quality of life, mobility.  Her sister is with her today.  She attest to the fact that this is incredibly problematic having terrible hips.  HPI  Review of Systems She currently denies any headache, chest pain, shortness of breath, fever, chills, nausea, vomiting.  Objective: Vital Signs: There were no vitals taken for this visit.  Physical Exam Ortho ExamShe is alert a and oriented and follows commands appropriately.  She sitting in a wheelchair.  She exhibits severe pain with any attempts of internal or external  rotation of either hip with the left being worse than the right.  Specialty Comments:  No specialty comments available.  Imaging: Xr Pelvis 1-2 Views  Result Date: 03/01/2018 An AP pelvis shows bilateral hip avascular necrosis with femoral head collapse bilaterally with left worse than right.    PMFS History: Patient Active Problem List   Diagnosis Date Noted  . Avascular necrosis of hip, left ([place]) 02/08/2018  . Avascular necrosis of hip, right ([place]) 02/08/2018  . Psychoactive substance-induced mood disorder ([place]) 11/13/2016  . Vaginal discharge 12/19/2015  . 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 Pain in joint, shoulder region 08/08/2013  . 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 Rib fractures 07/07/2012  . Weight loss 07/07/2012  . Cervical lymphadenopathy  07/07/2012  . Lymphadenopathy 07/07/2012  . Syncope 05/09/2012  . Neck pain, musculoskeletal 05/09/2012  . Pain in joint of right elbow, possible small fracture 05/09/2012  . Tobacco abuse 05/09/2012  . 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 HIV (human immunodeficiency virus infection) ([place]) 02/06/2012  . LOOSE BODY IN KNEE 10/01/2007  . FRACTURE, TOE, RIGHT 10/01/2007  . ANKLE SPRAIN, RIGHT 10/01/2007  . OSTEOCHONDRITIS DISSECANS 06/09/2007  . KNEE PAIN 05/24/2007  . LIPOMA 01/28/2007  . Major depressive disorder, recurrent episode ([place]) 01/28/2007  . Anxiety state 01/28/2007  . TENSION HEADACHE 01/28/2007  . BRONCHITIS, CHRONIC 01/28/2007  . MENOPAUSAL SYNDROME 01/28/2007  . Enthesopathy of hip region 01/28/2007  . PARESTHESIA NOS 01/28/2007   Past Medical History:  Diagnosis Date  . Anxiety   . Arthritis   . Asthma   . Chronic bronchitis   . COPD (chronic obstructive pulmonary disease) ([place])   . Depression   . Fibromyalgia    "I think I got that"  . Headache(784.0)   . HIV (human immunodeficiency virus infection) ([place])    "dx'd ~ 12/2011"  . Lipoma    right forearm  . 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 Papillary carcinoma of thyroid ([place]) 03/11/12  . Pneumonia ~ 2000's   "once"  . Shortness of breath on exertion   . Thyroid cancer s/p total thyroidectomy April2013 02/06/2012   Per patient report.  Recently diagnosed. Awaiting procedure to have thyroid removed.  Under treatment with Dr Janace Hoard .   Marland Kitchen Vaginal discharge 12/19/2015    Family History  Problem Relation Age of Onset  . Heart disease Maternal Uncle     Past Surgical History:  Procedure Laterality Date  . ANTERIOR CERVICAL DECOMP/DISCECTOMY FUSION  05/2009   C3-4; C5-6  . DILATION AND CURETTAGE OF UTERUS    . THYROIDECTOMY  03/11/12   bilaterally  . THYROIDECTOMY  03/11/2012   Procedure: THYROIDECTOMY;  Surgeon: Melissa Montane, MD;  Location: [facility];  Service: ENT;  Laterality:  Bilateral;   Social History   Occupational History  . Not on file  Tobacco Use  . Smoking status: Current Every Day Smoker    Packs/day: 0.50    Years: 47.00    Pack years: 23.50    Types: Cigarettes  . Smokeless tobacco: Former Systems developer    Quit date: 12/01/1974  . Tobacco comment: currently smokes   Substance and Sexual Activity  . Alcohol use: No    Comment: 03/11/12 "no beer or wine ~ 01/2012"  . Drug use: No    Frequency: 1.0 times per week    Types: Marijuana, Cocaine    Comment: 03/11/12 "last drug use early /2013"  . Sexual activity: Not Currently  Partners: Male    Birth control/protection: None, Post-menopausal

## 2018-03-08 ENCOUNTER — Telehealth: Payer: Self-pay

## 2018-03-08 NOTE — Telephone Encounter (Signed)
Patient called wanted appointment with Dr. Drucilla Schmidt. His next available is 05/10/2018. Appointment was made with pharmacy for 03/17/2018 at 10am. Patient also requesting ensure. Patient advised that  A weight is needed. Her last appointment was January and has recent no shows. Pola Corn, LPN

## 2018-03-17 ENCOUNTER — Ambulatory Visit: Payer: Self-pay

## 2018-04-07 ENCOUNTER — Encounter (HOSPITAL_COMMUNITY): Payer: Self-pay | Admitting: *Deleted

## 2018-04-07 ENCOUNTER — Inpatient Hospital Stay (HOSPITAL_COMMUNITY)
Admission: EM | Admit: 2018-04-07 | Discharge: 2018-04-12 | DRG: 974 | Disposition: A | Discharge status: Hospice | Payer: Medicare Other | Attending: Internal Medicine | Admitting: Internal Medicine

## 2018-04-07 ENCOUNTER — Other Ambulatory Visit: Payer: Self-pay

## 2018-04-07 ENCOUNTER — Emergency Department (HOSPITAL_COMMUNITY): Payer: Medicare Other

## 2018-04-07 DIAGNOSIS — R945 Abnormal results of liver function studies: Secondary | ICD-10-CM

## 2018-04-07 DIAGNOSIS — R160 Hepatomegaly, not elsewhere classified: Secondary | ICD-10-CM | POA: Diagnosis present

## 2018-04-07 DIAGNOSIS — F1490 Cocaine use, unspecified, uncomplicated: Secondary | ICD-10-CM | POA: Diagnosis present

## 2018-04-07 DIAGNOSIS — Z79818 Long term (current) use of other agents affecting estrogen receptors and estrogen levels: Secondary | ICD-10-CM

## 2018-04-07 DIAGNOSIS — K7201 Acute and subacute hepatic failure with coma: Secondary | ICD-10-CM | POA: Diagnosis present

## 2018-04-07 DIAGNOSIS — N289 Disorder of kidney and ureter, unspecified: Secondary | ICD-10-CM

## 2018-04-07 DIAGNOSIS — J181 Lobar pneumonia, unspecified organism: Secondary | ICD-10-CM | POA: Diagnosis present

## 2018-04-07 DIAGNOSIS — E890 Postprocedural hypothyroidism: Secondary | ICD-10-CM | POA: Diagnosis present

## 2018-04-07 DIAGNOSIS — N179 Acute kidney failure, unspecified: Secondary | ICD-10-CM

## 2018-04-07 DIAGNOSIS — F1290 Cannabis use, unspecified, uncomplicated: Secondary | ICD-10-CM | POA: Diagnosis present

## 2018-04-07 DIAGNOSIS — N170 Acute kidney failure with tubular necrosis: Secondary | ICD-10-CM | POA: Diagnosis present

## 2018-04-07 DIAGNOSIS — Z79890 Hormone replacement therapy (postmenopausal): Secondary | ICD-10-CM

## 2018-04-07 DIAGNOSIS — A419 Sepsis, unspecified organism: Principal | ICD-10-CM | POA: Diagnosis present

## 2018-04-07 DIAGNOSIS — L8990 Pressure ulcer of unspecified site, unspecified stage: Secondary | ICD-10-CM | POA: Diagnosis present

## 2018-04-07 DIAGNOSIS — E872 Acidosis, unspecified: Secondary | ICD-10-CM

## 2018-04-07 DIAGNOSIS — R188 Other ascites: Secondary | ICD-10-CM | POA: Diagnosis present

## 2018-04-07 DIAGNOSIS — R627 Adult failure to thrive: Secondary | ICD-10-CM | POA: Diagnosis present

## 2018-04-07 DIAGNOSIS — J440 Chronic obstructive pulmonary disease with acute lower respiratory infection: Secondary | ICD-10-CM | POA: Diagnosis present

## 2018-04-07 DIAGNOSIS — E876 Hypokalemia: Secondary | ICD-10-CM | POA: Diagnosis present

## 2018-04-07 DIAGNOSIS — B370 Candidal stomatitis: Secondary | ICD-10-CM | POA: Diagnosis present

## 2018-04-07 DIAGNOSIS — Z515 Encounter for palliative care: Secondary | ICD-10-CM | POA: Diagnosis present

## 2018-04-07 DIAGNOSIS — G9341 Metabolic encephalopathy: Secondary | ICD-10-CM | POA: Diagnosis present

## 2018-04-07 DIAGNOSIS — K7460 Unspecified cirrhosis of liver: Secondary | ICD-10-CM | POA: Diagnosis present

## 2018-04-07 DIAGNOSIS — J189 Pneumonia, unspecified organism: Secondary | ICD-10-CM

## 2018-04-07 DIAGNOSIS — M6282 Rhabdomyolysis: Secondary | ICD-10-CM | POA: Diagnosis present

## 2018-04-07 DIAGNOSIS — E861 Hypovolemia: Secondary | ICD-10-CM | POA: Diagnosis present

## 2018-04-07 DIAGNOSIS — R6521 Severe sepsis with septic shock: Secondary | ICD-10-CM | POA: Diagnosis present

## 2018-04-07 DIAGNOSIS — Z79899 Other long term (current) drug therapy: Secondary | ICD-10-CM

## 2018-04-07 DIAGNOSIS — D649 Anemia, unspecified: Secondary | ICD-10-CM

## 2018-04-07 DIAGNOSIS — Z882 Allergy status to sulfonamides status: Secondary | ICD-10-CM

## 2018-04-07 DIAGNOSIS — F17210 Nicotine dependence, cigarettes, uncomplicated: Secondary | ICD-10-CM | POA: Diagnosis present

## 2018-04-07 DIAGNOSIS — B20 Human immunodeficiency virus [HIV] disease: Secondary | ICD-10-CM | POA: Diagnosis present

## 2018-04-07 DIAGNOSIS — E860 Dehydration: Secondary | ICD-10-CM | POA: Diagnosis present

## 2018-04-07 DIAGNOSIS — M879 Osteonecrosis, unspecified: Secondary | ICD-10-CM | POA: Diagnosis present

## 2018-04-07 DIAGNOSIS — K869 Disease of pancreas, unspecified: Secondary | ICD-10-CM | POA: Diagnosis present

## 2018-04-07 DIAGNOSIS — I5030 Unspecified diastolic (congestive) heart failure: Secondary | ICD-10-CM | POA: Diagnosis present

## 2018-04-07 DIAGNOSIS — F1990 Other psychoactive substance use, unspecified, uncomplicated: Secondary | ICD-10-CM | POA: Diagnosis present

## 2018-04-07 DIAGNOSIS — R7989 Other specified abnormal findings of blood chemistry: Secondary | ICD-10-CM

## 2018-04-07 DIAGNOSIS — R591 Generalized enlarged lymph nodes: Secondary | ICD-10-CM | POA: Diagnosis present

## 2018-04-07 DIAGNOSIS — Z66 Do not resuscitate: Secondary | ICD-10-CM | POA: Diagnosis present

## 2018-04-07 DIAGNOSIS — D631 Anemia in chronic kidney disease: Secondary | ICD-10-CM | POA: Diagnosis present

## 2018-04-07 DIAGNOSIS — R911 Solitary pulmonary nodule: Secondary | ICD-10-CM | POA: Diagnosis present

## 2018-04-07 DIAGNOSIS — K831 Obstruction of bile duct: Secondary | ICD-10-CM | POA: Diagnosis present

## 2018-04-07 DIAGNOSIS — J9601 Acute respiratory failure with hypoxia: Secondary | ICD-10-CM

## 2018-04-07 LAB — URINALYSIS, ROUTINE W REFLEX MICROSCOPIC
GLUCOSE, UA: 100 mg/dL — AB
LEUKOCYTES UA: NEGATIVE
NITRITE: POSITIVE — AB
PH: 6.5 (ref 5.0–8.0)
Protein, ur: 100 mg/dL — AB
Specific Gravity, Urine: 1.025 (ref 1.005–1.030)

## 2018-04-07 LAB — BASIC METABOLIC PANEL
ANION GAP: 23 — AB (ref 5–15)
BUN: 115 mg/dL — ABNORMAL HIGH (ref 6–20)
CHLORIDE: 100 mmol/L — AB (ref 101–111)
CO2: 13 mmol/L — ABNORMAL LOW (ref 22–32)
Calcium: 7.9 mg/dL — ABNORMAL LOW (ref 8.9–10.3)
Creatinine, Ser: 12.06 mg/dL — ABNORMAL HIGH (ref 0.44–1.00)
GFR, EST AFRICAN AMERICAN: 3 mL/min — AB (ref >60)
GFR, EST NON AFRICAN AMERICAN: 3 mL/min — AB (ref >60)
Glucose, Bld: 88 mg/dL (ref 65–99)
POTASSIUM: 3.6 mmol/L (ref 3.5–5.1)
SODIUM: 136 mmol/L (ref 135–145)

## 2018-04-07 LAB — CBC WITH DIFFERENTIAL/PLATELET
BASOS ABS: 0.2 10*3/uL — AB (ref 0.0–0.1)
BLASTS: 0 %
Band Neutrophils: 0 %
Basophils Relative: 1 %
EOS PCT: 1 %
Eosinophils Absolute: 0.2 10*3/uL (ref 0.0–0.7)
HEMATOCRIT: 18.3 % — AB (ref 36.0–46.0)
HEMOGLOBIN: 6.1 g/dL — AB (ref 12.0–15.0)
LYMPHS PCT: 7 %
Lymphs Abs: 1.6 10*3/uL (ref 0.7–4.0)
MCH: 33.3 pg (ref 26.0–34.0)
MCHC: 33.3 g/dL (ref 30.0–36.0)
MCV: 100 fL (ref 78.0–100.0)
Metamyelocytes Relative: 2 %
Monocytes Absolute: 0.7 10*3/uL (ref 0.1–1.0)
Monocytes Relative: 3 %
Myelocytes: 2 %
Neutro Abs: 20.7 10*3/uL — ABNORMAL HIGH (ref 1.7–7.7)
Neutrophils Relative %: 82 %
Other: 0 %
PROMYELOCYTES RELATIVE: 2 %
Platelets: 323 10*3/uL (ref 150–400)
RBC: 1.83 MIL/uL — ABNORMAL LOW (ref 3.87–5.11)
RDW: 18.1 % — AB (ref 11.5–15.5)
WBC: 23.4 10*3/uL — AB (ref 4.0–10.5)
nRBC: 0 /100 WBC

## 2018-04-07 LAB — COMPREHENSIVE METABOLIC PANEL
ALBUMIN: 1.8 g/dL — AB (ref 3.5–5.0)
ALT: 45 U/L (ref 14–54)
ANION GAP: 21 — AB (ref 5–15)
AST: 111 U/L — ABNORMAL HIGH (ref 15–41)
Alkaline Phosphatase: 764 U/L — ABNORMAL HIGH (ref 38–126)
BILIRUBIN TOTAL: 48.9 mg/dL — AB (ref 0.3–1.2)
BUN: 113 mg/dL — ABNORMAL HIGH (ref 6–20)
CHLORIDE: 102 mmol/L (ref 101–111)
CO2: 13 mmol/L — ABNORMAL LOW (ref 22–32)
Calcium: 7.7 mg/dL — ABNORMAL LOW (ref 8.9–10.3)
Creatinine, Ser: 12.1 mg/dL — ABNORMAL HIGH (ref 0.44–1.00)
GFR calc Af Amer: 3 mL/min — ABNORMAL LOW (ref >60)
GFR, EST NON AFRICAN AMERICAN: 3 mL/min — AB (ref >60)
GLUCOSE: 89 mg/dL (ref 65–99)
Potassium: 3.6 mmol/L (ref 3.5–5.1)
Sodium: 136 mmol/L (ref 135–145)
TOTAL PROTEIN: 5.9 g/dL — AB (ref 6.5–8.1)

## 2018-04-07 LAB — BLOOD GAS, VENOUS
ACID-BASE DEFICIT: 15.4 mmol/L — AB (ref 0.0–2.0)
BICARBONATE: 11.9 mmol/L — AB (ref 20.0–28.0)
O2 Saturation: 25.3 %
PCO2 VEN: 35.7 mmHg — AB (ref 44.0–60.0)
Patient temperature: 98.6
pH, Ven: 7.148 — CL (ref 7.250–7.430)

## 2018-04-07 LAB — I-STAT CG4 LACTIC ACID, ED
LACTIC ACID, VENOUS: 1.22 mmol/L (ref 0.5–1.9)
Lactic Acid, Venous: 0.74 mmol/L (ref 0.5–1.9)

## 2018-04-07 LAB — PROTIME-INR
INR: 1.65
PROTHROMBIN TIME: 19.3 s — AB (ref 11.4–15.2)

## 2018-04-07 LAB — ETHANOL

## 2018-04-07 LAB — URINALYSIS, MICROSCOPIC (REFLEX): RBC / HPF: NONE SEEN RBC/hpf (ref 0–5)

## 2018-04-07 LAB — CBC
HEMATOCRIT: 18.9 % — AB (ref 36.0–46.0)
HEMOGLOBIN: 6.3 g/dL — AB (ref 12.0–15.0)
MCH: 33.3 pg (ref 26.0–34.0)
MCHC: 33.3 g/dL (ref 30.0–36.0)
MCV: 100 fL (ref 78.0–100.0)
Platelets: 355 10*3/uL (ref 150–400)
RBC: 1.89 MIL/uL — AB (ref 3.87–5.11)
RDW: 18 % — ABNORMAL HIGH (ref 11.5–15.5)
WBC: 22.4 10*3/uL — AB (ref 4.0–10.5)

## 2018-04-07 LAB — AMMONIA: AMMONIA: 67 umol/L — AB (ref 9–35)

## 2018-04-07 LAB — PREPARE RBC (CROSSMATCH)

## 2018-04-07 LAB — CK: CK TOTAL: 643 U/L — AB (ref 38–234)

## 2018-04-07 LAB — CBG MONITORING, ED: Glucose-Capillary: 86 mg/dL (ref 65–99)

## 2018-04-07 LAB — TROPONIN I: Troponin I: 0.39 ng/mL (ref <0.03)

## 2018-04-07 MED ORDER — NOREPINEPHRINE BITARTRATE 1 MG/ML IV SOLN
0.0000 ug/min | Freq: Once | INTRAVENOUS | Status: AC
  Administered 2018-04-07 – 2018-04-08 (×2): 10 ug/min via INTRAVENOUS
  Filled 2018-04-07 (×2): qty 4

## 2018-04-07 MED ORDER — VANCOMYCIN HCL 10 G IV SOLR
1250.0000 mg | INTRAVENOUS | Status: AC
  Administered 2018-04-07: 1250 mg via INTRAVENOUS
  Filled 2018-04-07: qty 1250

## 2018-04-07 MED ORDER — SODIUM CHLORIDE 0.9 % IV BOLUS
1000.0000 mL | Freq: Once | INTRAVENOUS | Status: AC
  Administered 2018-04-07: 1000 mL via INTRAVENOUS

## 2018-04-07 MED ORDER — VANCOMYCIN HCL 10 G IV SOLR
1500.0000 mg | INTRAVENOUS | Status: DC
  Filled 2018-04-07: qty 1500

## 2018-04-07 MED ORDER — SODIUM BICARBONATE 8.4 % IV SOLN
INTRAVENOUS | Status: DC
  Administered 2018-04-08 – 2018-04-11 (×9): via INTRAVENOUS
  Filled 2018-04-07 (×13): qty 150

## 2018-04-07 MED ORDER — SODIUM CHLORIDE 0.9 % IV SOLN: 10.0000 mL/h | Freq: Once | INTRAVENOUS | Status: DC

## 2018-04-07 MED ORDER — PIPERACILLIN-TAZOBACTAM 3.375 G IVPB 30 MIN: 3.3750 g | INTRAVENOUS | Status: DC

## 2018-04-07 MED ORDER — SODIUM BICARBONATE 8.4 % IV SOLN
50.0000 meq | Freq: Once | INTRAVENOUS | Status: AC
Start: 2018-04-07 — End: 2018-04-07
  Administered 2018-04-07: 50 meq via INTRAVENOUS
  Filled 2018-04-07: qty 50

## 2018-04-07 MED ORDER — PIPERACILLIN-TAZOBACTAM IN DEX 2-0.25 GM/50ML IV SOLN
2.2500 g | INTRAVENOUS | Status: AC
  Administered 2018-04-07: 2.25 g via INTRAVENOUS
  Filled 2018-04-07: qty 50

## 2018-04-07 NOTE — ED Notes (Signed)
Patient transferred to Res B-3rd PIV started and Levophed started at 10 mcg/min for SBP 60's-patient has received 3 liters NS fluid boluses-patient is lethargic but alert and oriented x 3.

## 2018-04-07 NOTE — Progress Notes (Addendum)
A consult was received from an ED provider for Vancomycin and Zosyn per pharmacy dosing.  The patient's profile has been reviewed for ht/wt/allergies/indication/available labs. Note AKI, SCr 12.06.   A one time order has been placed for Vancomycin 1250mg  IV and Zosyn 2.25g IV.  Further antibiotics/pharmacy consults should be ordered by admitting physician if indicated.                       Thank you, Luiz Ochoa 04/07/2018  9:29 PM

## 2018-04-07 NOTE — ED Notes (Signed)
Date and time results received: 04/07/18 2317  Test: Total Bilirubin  Critical Value: 48.9  Name of Provider Notified: Langston Masker RN via telephone at 23:23   Orders Received? Or Actions Taken?: Will continue to monitor and await on new orders.

## 2018-04-07 NOTE — ED Provider Notes (Addendum)
[facility] DEPT Provider Note   CSN: [id number] Arrival date & time: 04/07/18  2006     History   Chief Complaint Chief Complaint  Patient presents with  . Failure To Thrive    HPI [name] is a 67 y.o. female.  HPI  Patient is a 67 year old female with a history of HIV (changed to [place] in October 2018, followed by Dr. Drucilla Schmidt; last CD4 1,120 and HIV undetectable in 08/2017), avascular necrosis of bilateral hips, followed by orthopedic surgery, COPD, polysubstance use, presenting for altered mental status.  Level 5 caveat altered mental status.  Collateral information obtained from patient and her sister.  Patient sister reports that she has had difficulty walking due to the avascular necrosis in her hips for approximately 1 year.  Patient sister reports patient has been unwell for over a month now, but is usually responsive to her, she is able to speak with her on the phone.  Patient sister reports she last spoke with her sister 2 days ago, and her sister complained that her abdomen abdomen was hurting and she had diarrhea.  Patient has home health assistance with people in her neighborhood that need housing, who cook meals as well as help her with ADLs.  Patient sister reports that these aide said that she has been refusing food and drink for 4 days.  Patient sister reports that she is unsure of the intentions of the folks that help her.  Patient has been actively refusing transfer to hospital as well as assistance in rehab facility.  Past Medical History:  Diagnosis Date  . Anxiety   . Arthritis   . Asthma   . Chronic bronchitis   . COPD (chronic obstructive pulmonary disease) ([place])   . Depression   . Fibromyalgia    "I think I got that"  . Headache(784.0)   . HIV (human immunodeficiency virus infection) ([place])    "dx'd ~ 12/2011"  . Lipoma    right forearm  . 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 Papillary carcinoma of thyroid ([place])  03/11/12  . Pneumonia ~ 2000's   "once"  . Shortness of breath on exertion   . Thyroid cancer s/p total thyroidectomy April2013 02/06/2012   Per patient report.  Recently diagnosed. Awaiting procedure to have thyroid removed.  Under treatment with Dr Janace Hoard .   Marland Kitchen Vaginal discharge 12/19/2015    Patient Active Problem List   Diagnosis Date Noted  . Avascular necrosis of hip, left ([place]) 02/08/2018  . Avascular necrosis of hip, right ([place]) 02/08/2018  . Psychoactive substance-induced mood disorder ([place]) 11/13/2016  . Vaginal discharge 12/19/2015  . 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 Pain in joint, shoulder region 08/08/2013  . 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 Rib fractures 07/07/2012  . Weight loss 07/07/2012  . Cervical lymphadenopathy 07/07/2012  . Lymphadenopathy 07/07/2012  . Syncope 05/09/2012  . Neck pain, musculoskeletal 05/09/2012  . Pain in joint of right elbow, possible small fracture 05/09/2012  . Tobacco abuse 05/09/2012  . 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 HIV (human immunodeficiency virus infection) ([place]) 02/06/2012  . LOOSE BODY IN KNEE 10/01/2007  . FRACTURE, TOE, RIGHT 10/01/2007  . ANKLE SPRAIN, RIGHT 10/01/2007  . OSTEOCHONDRITIS DISSECANS 06/09/2007  . KNEE PAIN 05/24/2007  . LIPOMA 01/28/2007  . Major depressive disorder, recurrent episode ([place]) 01/28/2007  . Anxiety state 01/28/2007  . TENSION HEADACHE 01/28/2007  . BRONCHITIS, CHRONIC 01/28/2007  . MENOPAUSAL SYNDROME  01/28/2007  . Enthesopathy of hip region 01/28/2007  . PARESTHESIA NOS 01/28/2007    Past Surgical History:  Procedure Laterality Date  . ANTERIOR CERVICAL DECOMP/DISCECTOMY FUSION  05/2009   C3-4; C5-6  . DILATION AND CURETTAGE OF UTERUS    . THYROIDECTOMY  03/11/12   bilaterally  . THYROIDECTOMY  03/11/2012   Procedure: THYROIDECTOMY;  Surgeon: Melissa Montane, MD;  Location: [facility];  Service: ENT;  Laterality: Bilateral;     OB History   None        Home Medications    Prior to Admission medications   Medication Sig Start Date End Date Taking? Authorizing Provider  albuterol (PROVENTIL HFA;VENTOLIN HFA) 108 (90 Base) MCG/ACT inhaler Inhale 2 puffs into the lungs every 4 (four) hours as needed for wheezing or shortness of breath. 04/24/17   Cardama, Grayce Sessions, MD  atazanavir-cobicistat (EVOTAZ) 300-150 MG tablet Take 1 tablet by mouth daily with breakfast. Swallow whole. Do NOT crush, cut or chew tablet. Take with food. 02/24/18   Truman Hayward, MD  citalopram (CELEXA) 20 MG tablet Take 1 tablet (20 mg total) by mouth daily. Patient taking differently: Take 20-40 mg by mouth daily as needed (depression).  09/30/17 09/30/18  Truman Hayward, MD  emtricitabine-tenofovir AF (DESCOVY) 200-25 MG tablet Take 1 tablet by mouth daily. 02/24/18   Truman Hayward, MD  gabapentin (NEURONTIN) 300 MG capsule Take 1 capsule (300 mg total) by mouth 3 (three) times daily. Patient taking differently: Take 300 mg by mouth 2 (two) times daily as needed (nerve pain).  09/30/17   Truman Hayward, MD  ibuprofen (ADVIL,MOTRIN) 600 MG tablet Take 600 mg by mouth every 8 (eight) hours as needed for moderate pain.  01/04/18   [provider]  naproxen sodium (ALEVE) 220 MG tablet Take 220 mg by mouth 2 (two) times daily as needed (pain).     [provider]  SYNTHROID 150 MCG tablet Take 1 tablet (150 mcg total) by mouth daily. 02/24/18   Truman Hayward, MD    Family History Family History  Problem Relation Age of Onset  . Heart disease Maternal Uncle     Social History Social History   Tobacco Use  . Smoking status: Current Every Day Smoker    Packs/day: 0.50    Years: 47.00    Pack years: 23.50    Types: Cigarettes  . Smokeless tobacco: Former Systems developer    Quit date: 12/01/1974  . Tobacco comment: currently smokes   Substance Use Topics  . Alcohol use: No    Comment: 03/11/12 "no beer or wine ~ 01/2012"  .  Drug use: No    Frequency: 1.0 times per week    Types: Marijuana, Cocaine    Comment: 03/11/12 "last drug use early /2013"     Allergies   Sulfa antibiotics   Review of Systems Review of Systems  Unable to perform ROS as patient is obtunded.  Level 5 caveat.  Physical Exam Updated Vital Signs BP (!) 82/56 (BP Location: Right Arm)   Temp (!) 97.1 F (36.2 C) (Rectal)   Resp 15   SpO2 99%   Physical Exam  Constitutional:  Thin and cachectic appearing elderly female.  HENT:  Head: Normocephalic and atraumatic.  Eyes: Pupils are equal, round, and reactive to light. Scleral icterus is present.  Neck: Normal range of motion. Neck supple.  Cardiovascular: Normal rate, regular rhythm, S1 normal and S2 normal.  No  murmur heard. Non-tachycardic.  Pulmonary/Chest: Effort normal and breath sounds normal.  Abdominal: Soft. She exhibits distension. There is tenderness.  Hypoactive bowel sounds.  Liver is palpable below the border of ribs, down to the umbilicus.  Patient tender overlying right upper quadrant.  Musculoskeletal: Normal range of motion. She exhibits no edema or deformity.  Lymphadenopathy:    She has no cervical adenopathy.  Neurological: She is alert.  Patient responsive to painful stimuli lower extremities, but minimally in bilateral upper extremities.   Skin: Skin is warm.  Skin notably dry.  Patient diffusely jaundiced.  Nursing note and vitals reviewed.    ED Treatments / Results  Labs (all labs ordered are listed, but only abnormal results are displayed) Labs Reviewed  BASIC METABOLIC PANEL - Abnormal; Notable for the following components:      Result Value   Chloride 100 (*)    CO2 13 (*)    Creatinine, Ser 12.06 (*)    Calcium 7.9 (*)    GFR calc non Af Amer 3 (*)    GFR calc Af Amer 3 (*)    Anion gap 23 (*)    All other components within normal limits  CBC - Abnormal; Notable for the following components:   WBC 22.4 (*)    RBC 1.89 (*)     Hemoglobin 6.3 (*)    HCT 18.9 (*)    RDW 18.0 (*)    All other components within normal limits  URINALYSIS, ROUTINE W REFLEX MICROSCOPIC - Abnormal; Notable for the following components:   Color, Urine [place] (*)    APPearance CLOUDY (*)    Glucose, UA 100 (*)    Hgb urine dipstick TRACE (*)    Bilirubin Urine LARGE (*)    Ketones, ur TRACE (*)    Protein, ur 100 (*)    Nitrite POSITIVE (*)    All other components within normal limits  CK - Abnormal; Notable for the following components:   Total CK 643 (*)    All other components within normal limits  URINALYSIS, MICROSCOPIC (REFLEX) - Abnormal; Notable for the following components:   Bacteria, UA FEW (*)    All other components within normal limits  URINE CULTURE  CULTURE, BLOOD (ROUTINE X 2)  CULTURE, BLOOD (ROUTINE X 2)  ETHANOL  COMPREHENSIVE METABOLIC PANEL  CBC WITH DIFFERENTIAL/PLATELET  URINALYSIS, COMPLETE (UACMP) WITH MICROSCOPIC  RAPID URINE DRUG SCREEN, HOSP PERFORMED  BLOOD GAS, VENOUS  PROTIME-INR  AMMONIA  T-HELPER CELLS (CD4) COUNT (NOT AT [facility])  CBG MONITORING, ED  I-STAT CG4 LACTIC ACID, ED  CBG MONITORING, ED  I-STAT CHEM 8, ED  I-STAT CG4 LACTIC ACID, ED  I-STAT TROPONIN, ED  TYPE AND SCREEN    EKG EKG Interpretation  Date/Time:  Wednesday Apr 07 2018 21:10:04 EDT Ventricular Rate:  66 PR Interval:    QRS Duration: 146 QT Interval:  496 QTC Calculation: 520 R Axis:   89 Text Interpretation:  Sinus rhythm Right bundle branch block Inferior infarct, acute Lateral leads are also involved nonspecific changes since previous Confirmed by Wandra Arthurs [phone]) on 04/07/2018 10:06:20 PM   Radiology Dg Chest 1 View  Result Date: 04/07/2018 CLINICAL DATA:  Weakness.  HIV. EXAM: CHEST  1 VIEW COMPARISON:  01/26/2018. FINDINGS: AP portable supine radiograph demonstrates elevated RIGHT hemidiaphragm. Perihilar opacity is new/increased from priors, extending into the RIGHT-sided retrocardiac region,  suspicious for RIGHT lower lobe pneumonia. Streaky LEFT basilar density probably atelectasis. No osseous findings of significance. Previous  cervical fusion. Normal heart size. IMPRESSION: Worsening aeration from priors. Query RIGHT perihilar pneumonia with volume loss. Electronically Signed   By: Staci Righter M.D.   On: 04/07/2018 22:49    Procedures Procedures (including critical care time)  CRITICAL CARE Performed by: Albesa Seen   Total critical care time: 35 minutes  Critical care time was exclusive of separately billable procedures and treating other patients.  Critical care was necessary to treat or prevent imminent or life-threatening deterioration (hypotension requiring pressors, profound anemia, altered mental status, acidosis.)  Critical care was time spent personally by me on the following activities: development of treatment plan with patient and/or surrogate as well as nursing, discussions with consultants, evaluation of patient's response to treatment, examination of patient, obtaining history from patient or surrogate, ordering and performing treatments and interventions, ordering and review of laboratory studies, ordering and review of radiographic studies, pulse oximetry and re-evaluation of patient's condition.   Medications Ordered in ED Medications  sodium chloride 0.9 % bolus 1,000 mL (1,000 mLs Intravenous New Bag/Given 04/07/18 2127)  piperacillin-tazobactam (ZOSYN) IVPB 3.375 g (has no administration in time range)  sodium chloride 0.9 % bolus 1,000 mL (1,000 mLs Intravenous New Bag/Given 04/07/18 2134)     Initial Impression / Assessment and Plan / ED Course  I have reviewed the triage vital signs and the nursing notes.  Pertinent labs & imaging results that were available during my care of the patient were reviewed by me and considered in my medical decision making (see chart for details).  Clinical Course as of Apr 08 14  Wed Apr 07, 2018  2225 2L NS  now finished and patient still hypotensive. Levophed drip ordered.   Platelets: 355 [AM]  2300 Patient reevaluated, and is alert to spoken voice, as well is can repeat her own name, and identify that she is a hospital.   [AM]  2301 Noted elevated troponin.  Likely demand ischemia and/or renal failure.  Low suspicion for MI at this time.   [AM]  2323 Noted total bili of 48.9 noted. Verbal read from Rn.    [AM]  2338 Patient reevaluated.  Patient receiving central line.  Blood pressures consistently stable above 90 systolic.   [AM]    Clinical Course User Index [AM] Albesa Seen, PA-C    Patient is critically ill requiring high level of care.  Patient obtunded on initial evaluation.  Initiated 2 L of normal saline as patient significant dry.  Patient also significantly jaundiced, has no prior history of liver failure.  Patient protecting airway throughout examination.  Differential diagnosis includes: ICH / Stroke, ACS, Sepsis syndrome, Infection - UTI/Pneumonia, Encephalopathy, meningitis, Electrolyte abnormality, Drug overdose, DKA, Metabolic disorders including thyroid disorders, adrenal insufficiency, cancer of unknown origin / paraneoplastic process, Hypercapnia / COPD, Hypoxia  Patient exhibits creatinine of 12.06 and BUN of 115.  This is new renal failure.  Patient has leukocytosis of 22.4 with left shift.  Discharge AST 111, ALT 45, alkaline phosphatase 764, and total bili 48.4.  With these values, concern for biliary obstruction.  PT/INR 19.3/1.65. Initial troponin elevated at 0.39.  EKG with right bundle branch block, but no significant change from prior, reviewed by me.  Urinalysis without clear signs of infection, nitrite positive but no evidence of leukocytes or significant bacterial load.  As differential diagnosis is broad at this time, and patient is in multiorgan system failure, code sepsis called and vancomycin and Zosyn ordered at time of initial evaluation.   Patient  with  clinical improvement to administration of normal saline and antibiotics as well as pressors.  Transfusion of PRBCs initiated as well.  12:17 AM Chest x-ray shows possible right perihilar infiltrate.  CT head and abdomen pending.  Care signed out to Quincy Carnes, PA-C, who will follow these results.  Patient is already admitted per Dr. Shirlyn Goltz to critical care, who will evaluate the patient and determine stabilization and admission at [facility] versus transfer to [facility] for ICU.  Patient's sister [name] updated on course of care, as well as results at this point.  Patient sister emphasized that patient is full code, and would wish for every intervention available.  Final Clinical Impressions(s) / ED Diagnoses   Final diagnoses:  AKI (acute kidney injury) ([place])  Metabolic acidosis  Acute liver failure with hepatic coma (HCC)  Anemia, unspecified type  Community acquired pneumonia of right lower lobe of lung [facility])    ED Discharge Orders    None       Tamala Julian 04/08/18 0216    Drenda Freeze, MD 04/09/18 [phone]

## 2018-04-07 NOTE — ED Notes (Signed)
Bed: KU57 Expected date:  Expected time:  Means of arrival:  Comments: EMS 67 yo weak/incontinent/jaundice/hypotension-has not gotten off couch in 4 days

## 2018-04-07 NOTE — ED Triage Notes (Signed)
EMS called out to house by family d/t pt not taking anything po and not mobilizing from couch x4 days. Pt not following commands or verbal at this time. Also pt was incont both PTA and en route with EMS.

## 2018-04-07 NOTE — ED Notes (Signed)
Date and time results received: 04/07/18 10:49 PM (use smartphrase ".now" to insert current time)  Test: Troponin Critical Value: 0.39  Name of Provider Notified: Franklyn Lor PA  Orders Received? Or Actions Taken?:

## 2018-04-07 NOTE — ED Notes (Signed)
Date and time results received: 04/07/18 2137   Test: HGB  Critical Value: 6.3  Name of Provider Notified: Langston Masker, PA via telephone.   Orders Received? Or Actions Taken?: Will continue to monitor and await new orders.

## 2018-04-08 ENCOUNTER — Inpatient Hospital Stay (HOSPITAL_COMMUNITY): Payer: Medicare Other

## 2018-04-08 ENCOUNTER — Emergency Department (HOSPITAL_COMMUNITY): Payer: Medicare Other

## 2018-04-08 DIAGNOSIS — A419 Sepsis, unspecified organism: Secondary | ICD-10-CM | POA: Diagnosis present

## 2018-04-08 DIAGNOSIS — Z66 Do not resuscitate: Secondary | ICD-10-CM | POA: Diagnosis present

## 2018-04-08 DIAGNOSIS — B370 Candidal stomatitis: Secondary | ICD-10-CM | POA: Diagnosis present

## 2018-04-08 DIAGNOSIS — G9341 Metabolic encephalopathy: Secondary | ICD-10-CM | POA: Diagnosis present

## 2018-04-08 DIAGNOSIS — R6521 Severe sepsis with septic shock: Secondary | ICD-10-CM

## 2018-04-08 DIAGNOSIS — I5030 Unspecified diastolic (congestive) heart failure: Secondary | ICD-10-CM | POA: Diagnosis present

## 2018-04-08 DIAGNOSIS — M879 Osteonecrosis, unspecified: Secondary | ICD-10-CM | POA: Diagnosis present

## 2018-04-08 DIAGNOSIS — B20 Human immunodeficiency virus [HIV] disease: Secondary | ICD-10-CM | POA: Diagnosis present

## 2018-04-08 DIAGNOSIS — N170 Acute kidney failure with tubular necrosis: Secondary | ICD-10-CM | POA: Diagnosis present

## 2018-04-08 DIAGNOSIS — D649 Anemia, unspecified: Secondary | ICD-10-CM | POA: Diagnosis not present

## 2018-04-08 DIAGNOSIS — N289 Disorder of kidney and ureter, unspecified: Secondary | ICD-10-CM | POA: Diagnosis not present

## 2018-04-08 DIAGNOSIS — J440 Chronic obstructive pulmonary disease with acute lower respiratory infection: Secondary | ICD-10-CM | POA: Diagnosis present

## 2018-04-08 DIAGNOSIS — I361 Nonrheumatic tricuspid (valve) insufficiency: Secondary | ICD-10-CM

## 2018-04-08 DIAGNOSIS — K7460 Unspecified cirrhosis of liver: Secondary | ICD-10-CM | POA: Diagnosis present

## 2018-04-08 DIAGNOSIS — K831 Obstruction of bile duct: Secondary | ICD-10-CM | POA: Diagnosis present

## 2018-04-08 DIAGNOSIS — E872 Acidosis, unspecified: Secondary | ICD-10-CM | POA: Insufficient documentation

## 2018-04-08 DIAGNOSIS — E860 Dehydration: Secondary | ICD-10-CM | POA: Diagnosis present

## 2018-04-08 DIAGNOSIS — J9601 Acute respiratory failure with hypoxia: Secondary | ICD-10-CM | POA: Diagnosis not present

## 2018-04-08 DIAGNOSIS — K7201 Acute and subacute hepatic failure with coma: Secondary | ICD-10-CM | POA: Insufficient documentation

## 2018-04-08 DIAGNOSIS — N179 Acute kidney failure, unspecified: Secondary | ICD-10-CM | POA: Diagnosis not present

## 2018-04-08 DIAGNOSIS — F17210 Nicotine dependence, cigarettes, uncomplicated: Secondary | ICD-10-CM | POA: Diagnosis present

## 2018-04-08 DIAGNOSIS — R188 Other ascites: Secondary | ICD-10-CM | POA: Diagnosis not present

## 2018-04-08 DIAGNOSIS — R627 Adult failure to thrive: Secondary | ICD-10-CM | POA: Diagnosis present

## 2018-04-08 DIAGNOSIS — R7989 Other specified abnormal findings of blood chemistry: Secondary | ICD-10-CM | POA: Diagnosis not present

## 2018-04-08 DIAGNOSIS — K869 Disease of pancreas, unspecified: Secondary | ICD-10-CM | POA: Diagnosis not present

## 2018-04-08 DIAGNOSIS — F1990 Other psychoactive substance use, unspecified, uncomplicated: Secondary | ICD-10-CM | POA: Diagnosis present

## 2018-04-08 DIAGNOSIS — R945 Abnormal results of liver function studies: Secondary | ICD-10-CM | POA: Diagnosis not present

## 2018-04-08 DIAGNOSIS — D631 Anemia in chronic kidney disease: Secondary | ICD-10-CM | POA: Diagnosis present

## 2018-04-08 DIAGNOSIS — J181 Lobar pneumonia, unspecified organism: Secondary | ICD-10-CM | POA: Diagnosis present

## 2018-04-08 DIAGNOSIS — M6282 Rhabdomyolysis: Secondary | ICD-10-CM | POA: Diagnosis present

## 2018-04-08 DIAGNOSIS — B379 Candidiasis, unspecified: Secondary | ICD-10-CM | POA: Diagnosis not present

## 2018-04-08 DIAGNOSIS — R160 Hepatomegaly, not elsewhere classified: Secondary | ICD-10-CM | POA: Diagnosis not present

## 2018-04-08 LAB — URINALYSIS, ROUTINE W REFLEX MICROSCOPIC

## 2018-04-08 LAB — GLUCOSE, CAPILLARY
GLUCOSE-CAPILLARY: 155 mg/dL — AB (ref 65–99)
GLUCOSE-CAPILLARY: 127 mg/dL — AB (ref 65–99)
Glucose-Capillary: 101 mg/dL — ABNORMAL HIGH (ref 65–99)

## 2018-04-08 LAB — HEMOGLOBIN AND HEMATOCRIT, BLOOD
HCT: 28.1 % — ABNORMAL LOW (ref 36.0–46.0)
HEMOGLOBIN: 9.9 g/dL — AB (ref 12.0–15.0)

## 2018-04-08 LAB — RENAL FUNCTION PANEL
ALBUMIN: 1.7 g/dL — AB (ref 3.5–5.0)
Anion gap: 18 — ABNORMAL HIGH (ref 5–15)
BUN: 99 mg/dL — AB (ref 6–20)
CALCIUM: 6.4 mg/dL — AB (ref 8.9–10.3)
CHLORIDE: 101 mmol/L (ref 101–111)
CO2: 18 mmol/L — ABNORMAL LOW (ref 22–32)
CREATININE: 10.15 mg/dL — AB (ref 0.44–1.00)
GFR calc Af Amer: 4 mL/min — ABNORMAL LOW (ref >60)
GFR, EST NON AFRICAN AMERICAN: 4 mL/min — AB (ref >60)
Glucose, Bld: 125 mg/dL — ABNORMAL HIGH (ref 65–99)
Phosphorus: 7.6 mg/dL — ABNORMAL HIGH (ref 2.5–4.6)
Potassium: 2.6 mmol/L — CL (ref 3.5–5.1)
SODIUM: 137 mmol/L (ref 135–145)

## 2018-04-08 LAB — I-STAT TROPONIN, ED: Troponin i, poc: 0.25 ng/mL (ref 0.00–0.08)

## 2018-04-08 LAB — T-HELPER CELLS (CD4) COUNT (NOT AT ARMC)
CD4 T CELL ABS: 200 /uL — AB (ref 400–2700)
CD4 T CELL HELPER: 28 % — AB (ref 33–55)

## 2018-04-08 LAB — RAPID URINE DRUG SCREEN, HOSP PERFORMED
Amphetamines: NOT DETECTED
BENZODIAZEPINES: NOT DETECTED
Barbiturates: NOT DETECTED
Cocaine: POSITIVE — AB
OPIATES: NOT DETECTED
Tetrahydrocannabinol: NOT DETECTED

## 2018-04-08 LAB — CORTISOL: Cortisol, Plasma: 18.3 ug/dL

## 2018-04-08 LAB — TYPE AND SCREEN
ABO/RH(D): O POS
ANTIBODY SCREEN: NEGATIVE

## 2018-04-08 LAB — MRSA PCR SCREENING: MRSA BY PCR: NEGATIVE

## 2018-04-08 LAB — ECHOCARDIOGRAM COMPLETE: WEIGHTICAEL: 2160 [oz_av]

## 2018-04-08 LAB — ABO/RH: ABO/RH(D): O POS

## 2018-04-08 LAB — STREP PNEUMONIAE URINARY ANTIGEN: STREP PNEUMO URINARY ANTIGEN: POSITIVE — AB

## 2018-04-08 LAB — C DIFFICILE QUICK SCREEN W PCR REFLEX
C DIFFICILE (CDIFF) INTERP: NOT DETECTED
C DIFFICILE (CDIFF) TOXIN: NEGATIVE
C Diff antigen: NEGATIVE

## 2018-04-08 LAB — PREPARE RBC (CROSSMATCH)

## 2018-04-08 MED ORDER — SODIUM CHLORIDE 0.9 % FOR CRRT
INTRAVENOUS_CENTRAL | Status: DC | PRN
  Administered 2018-04-08: 17:00:00 via INTRAVENOUS_CENTRAL
  Filled 2018-04-08: qty 1000

## 2018-04-08 MED ORDER — PRISMASOL BGK 4/2.5 32-4-2.5 MEQ/L IV SOLN
INTRAVENOUS | Status: DC
  Administered 2018-04-08 – 2018-04-09 (×3): via INTRAVENOUS_CENTRAL
  Filled 2018-04-08 (×5): qty 5000

## 2018-04-08 MED ORDER — GERHARDT'S BUTT CREAM
TOPICAL_CREAM | Freq: Two times a day (BID) | CUTANEOUS | Status: DC
  Administered 2018-04-08 – 2018-04-12 (×8): via TOPICAL
  Filled 2018-04-08: qty 1

## 2018-04-08 MED ORDER — FLUCONAZOLE IN SODIUM CHLORIDE 400-0.9 MG/200ML-% IV SOLN
400.0000 mg | INTRAVENOUS | Status: DC
  Administered 2018-04-08 – 2018-04-09 (×2): 400 mg via INTRAVENOUS
  Filled 2018-04-08 (×3): qty 200

## 2018-04-08 MED ORDER — INSULIN ASPART 100 UNIT/ML ~~LOC~~ SOLN
1.0000 [IU] | SUBCUTANEOUS | Status: DC
  Administered 2018-04-11: 1 [IU] via SUBCUTANEOUS

## 2018-04-08 MED ORDER — PRISMASOL BGK 4/2.5 32-4-2.5 MEQ/L IV SOLN
INTRAVENOUS | Status: DC
  Administered 2018-04-08 – 2018-04-10 (×14): via INTRAVENOUS_CENTRAL
  Filled 2018-04-08 (×23): qty 5000

## 2018-04-08 MED ORDER — ORAL CARE MOUTH RINSE
15.0000 mL | Freq: Two times a day (BID) | OROMUCOSAL | Status: DC
  Administered 2018-04-08 – 2018-04-11 (×7): 15 mL via OROMUCOSAL

## 2018-04-08 MED ORDER — SODIUM CHLORIDE 0.9 % IV SOLN
250.0000 mL | INTRAVENOUS | Status: DC | PRN
  Administered 2018-04-08: 250 mL via INTRAVENOUS

## 2018-04-08 MED ORDER — SODIUM CHLORIDE 0.9 % IV SOLN: Freq: Once | INTRAVENOUS | Status: DC

## 2018-04-08 MED ORDER — ALBUMIN HUMAN 25 % IV SOLN
12.5000 g | INTRAVENOUS | Status: AC
  Administered 2018-04-08: 12.5 g via INTRAVENOUS
  Filled 2018-04-08: qty 50

## 2018-04-08 MED ORDER — FAMOTIDINE IN NACL 20-0.9 MG/50ML-% IV SOLN
20.0000 mg | Freq: Every day | INTRAVENOUS | Status: DC
  Administered 2018-04-08 – 2018-04-11 (×4): 20 mg via INTRAVENOUS
  Filled 2018-04-08 (×5): qty 50

## 2018-04-08 MED ORDER — LACTATED RINGERS IV BOLUS
2000.0000 mL | Freq: Once | INTRAVENOUS | Status: AC
  Administered 2018-04-08: 2000 mL via INTRAVENOUS

## 2018-04-08 MED ORDER — SODIUM CHLORIDE 0.9 % IV SOLN
10.0000 mg | Freq: Every day | INTRAVENOUS | Status: DC
  Administered 2018-04-08: 10 mg via INTRAVENOUS
  Filled 2018-04-08 (×2): qty 1

## 2018-04-08 MED ORDER — POTASSIUM CHLORIDE 10 MEQ/50ML IV SOLN
10.0000 meq | INTRAVENOUS | Status: AC
  Administered 2018-04-08 (×4): 10 meq via INTRAVENOUS
  Filled 2018-04-08 (×4): qty 50

## 2018-04-08 MED ORDER — PRISMASOL BGK 4/2.5 32-4-2.5 MEQ/L IV SOLN
INTRAVENOUS | Status: DC
  Administered 2018-04-08 – 2018-04-09 (×2): via INTRAVENOUS_CENTRAL
  Filled 2018-04-08 (×5): qty 5000

## 2018-04-08 MED ORDER — FAMOTIDINE IN NACL 20-0.9 MG/50ML-% IV SOLN: 20.0000 mg | Freq: Two times a day (BID) | INTRAVENOUS | Status: DC

## 2018-04-08 MED ORDER — HEPARIN SODIUM (PORCINE) 1000 UNIT/ML DIALYSIS
1000.0000 [IU] | INTRAMUSCULAR | Status: DC | PRN
  Administered 2018-04-08: 3200 [IU] via INTRAVENOUS_CENTRAL
  Administered 2018-04-09: 4000 [IU] via INTRAVENOUS_CENTRAL
  Administered 2018-04-10: 3000 [IU] via INTRAVENOUS_CENTRAL
  Filled 2018-04-08: qty 6
  Filled 2018-04-08: qty 4
  Filled 2018-04-08: qty 3
  Filled 2018-04-08 (×2): qty 6
  Filled 2018-04-08: qty 1
  Filled 2018-04-08 (×2): qty 6

## 2018-04-08 MED ORDER — SODIUM CHLORIDE 0.9 % IV SOLN: 10.0000 mL/h | Freq: Once | INTRAVENOUS | Status: DC

## 2018-04-08 MED ORDER — CHLORHEXIDINE GLUCONATE CLOTH 2 % EX PADS
6.0000 | MEDICATED_PAD | Freq: Every day | CUTANEOUS | Status: DC
  Administered 2018-04-08 – 2018-04-09 (×2): 6 via TOPICAL

## 2018-04-08 MED ORDER — LACTATED RINGERS IV SOLN: INTRAVENOUS | Status: DC

## 2018-04-08 MED ORDER — CHLORHEXIDINE GLUCONATE 0.12 % MT SOLN
15.0000 mL | Freq: Two times a day (BID) | OROMUCOSAL | Status: DC
  Administered 2018-04-08 – 2018-04-12 (×9): 15 mL via OROMUCOSAL
  Filled 2018-04-08 (×5): qty 15

## 2018-04-08 MED ORDER — PIPERACILLIN-TAZOBACTAM IN DEX 2-0.25 GM/50ML IV SOLN
2.2500 g | Freq: Four times a day (QID) | INTRAVENOUS | Status: DC
  Administered 2018-04-08 – 2018-04-09 (×5): 2.25 g via INTRAVENOUS
  Filled 2018-04-08 (×9): qty 50

## 2018-04-08 NOTE — Progress Notes (Signed)
2D Echocardiogram has been performed.  [name] 04/08/2018, 3:18 PM

## 2018-04-08 NOTE — Progress Notes (Addendum)
PULMONARY / CRITICAL CARE MEDICINE   Name: [name] MRN:   [id number] DOB:   26-Jul-1951           ADMISSION DATE:  04/07/2018 CONSULTATION DATE:  5/9  REFERRING MD:  Dr. Valere Dross EDP  CHIEF COMPLAINT:  Shock  HISTORY OF PRESENT ILLNESS:   67 year old female with PMH as below, which is significant for HIV followed by Dr. Tommy Medal treated with Symtuza. 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 At her last office visit she was felt to have good control of her disease, but this is back in 2017, her current CD 4 count is 200 w/ exam c/w AIDS at this point. 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   Admitted 5/8 She presented to ED 5/8 for "failure to thrive". Family reports she has been unwell for over one month, but is usually responsive. She was last spoken to by her sister 2 days prior when she was complaining of abdominal pain and diarrhea. Family is worried that her caretakers are not competent. She had supposedly not been eating or drinking for days.   Upon arrival to the ED she was found to be hypotensive despite IVF resuscitation. Levophed started. Laboratory evaluation significant for creatinine of 12.06 and BUN of 115. Patient has leukocytosis of 22.4 with left shift. AST 111, ALT 45, alkaline phosphatase 764, and total bili 48.4.  PT/INR 19.3/1.65. Initial troponin elevated at 0.39. Hemoglobin 6.1. She was transfused. CT head wnl, CT abdomen done as well. Pending. She was started on ABX for presumed sepsis. PCCM asked to admit.   Subjective/interval: Complains of abdominal discomfort to palpation otherwise no shortness of breath, chest pain, or other complaints.  She is quite lethargic.  Presented on levo fed at 10, now this is off.  She has now completed PRBCs and 2 FFP's.  Objective Blood Pressure 101/66   Pulse 68   Temperature (Abnormal) 97.2 F (36.2 C) (Oral)    Respiration 18   Weight 135 lb (61.2 kg)   Oxygen Saturation 95%   Body Mass Index 20.53 kg/m      Intake/Output Summary (Last 24 hours) at 04/08/2018 1131 Last data filed at 04/08/2018 1030 Gross per 24 hour  Intake 10774 ml  Output 5 ml  Net 10769 ml    Physical exam General: This is a 67 year old chronically ill-appearing female she is lethargic, she is oriented, but affect is flat.  She is in no acute distress but does report abdominal pain HEENT: Normocephalic atraumatic.  Sclera are icteric her tongue is caked with thick [place]-white discoloration poor dentition, no jugular venous distention Pulmonary: Clear to auscultation without accessory use diminished in the bases Cardiac: Systolic and diastolic heart murmur pronounced 4 out of 6.  With crunching quality of diastolic component Abdomen: Distended, painful to palpation.  Hypoactive bowel sounds.  No clear organomegaly GU: [place]sey minimal urine output cloudy. Neuro: Awake, oriented, flat affect.  Generalized weakness. Extremities: Cool, strong pulses.  Lower extremity edema present.  CBC Recent Labs    04/07/18 2042 04/07/18 2139  WBC 22.4* 23.4*  HGB 6.3* 6.1*  HCT 18.9* 18.3*  PLT 355 323    Coag's Recent Labs    04/07/18 2139  INR 1.65    BMET Recent Labs    04/07/18 2042 04/07/18 2139  NA 136 136  K 3.6 3.6  CL 100* 102  CO2 13* 13*  BUN 115* 113*  CREATININE 12.06* 12.10*  GLUCOSE 88 89    Electrolytes Recent Labs    04/07/18 2042 04/07/18 2139  CALCIUM 7.9* 7.7*    Sepsis Markers No results for input(s): PROCALCITON, O2SATVEN in the last 72 hours.  Invalid input(s): LACTICACIDVEN  ABG No results for input(s): PHART, PCO2ART, PO2ART in the last 72 hours.  Liver Enzymes Recent Labs    04/07/18 2139  AST 111*  ALT 45  ALKPHOS 764*  BILITOT 48.9*  ALBUMIN 1.8*    Cardiac Enzymes Recent Labs    04/07/18 2218  TROPONINI 0.39*    Glucose Recent Labs    04/07/18 2120  04/08/18 1016  GLUCAP 86 155*    Imaging Ct Abdomen Pelvis Wo Contrast  Result Date: 04/08/2018 CLINICAL DATA:  Failure to thrive the past 4 days. Jaundice and incontinence. Leukocytosis. EXAM: CT ABDOMEN AND PELVIS WITHOUT CONTRAST TECHNIQUE: Multidetector CT imaging of the abdomen and pelvis was performed following the standard protocol without IV contrast. COMPARISON:  05/09/2012 CT FINDINGS: Lower chest: Tip of central line catheter is seen in the proximal right atrium. Mild cardiomegaly without pericardial effusion. Bilateral small pleural effusions with atelectasis at each lung base. 6 mm nodular density in the right lower lobe adjacent to the major fissure, series 2/2. Hepatobiliary: Surface nodularity morphologically consistent with cirrhosis. Left hepatic lobe hypodense mass measuring 4.8 x 4.7 x 3.7 cm is noted, new since prior exam and concerning for hepatocellular carcinoma or potentially metastatic disease. The gallbladder is distended and contains intraluminal hyperdense noncalcified abnormalities. This appears to extend into the common duct and may be contributing to the patient's jaundice. Hyperdense biliary sludge, blood products or intraluminal gallbladder mass might account for this appearance. Pancreas: The pancreas appears atrophic without ductal dilatation. Spleen: No splenomegaly. Adrenals/Urinary Tract: No adrenal mass. There is left adrenal limb thickening. Kidneys are visualized without nephrolithiasis nor obstructive uropathy. No discrete renal mass is identified. The urinary bladder is decompressed and somewhat thickened in appearance as a result. Stomach/Bowel: Physiologic distention of the stomach. No small bowel dilatation. No bowel obstruction. Sympathetic thickening of the intestine likely from surrounding ascites and edema. Circumferential rectal wall thickening possibly representing internal hemorrhoids. Vascular/Lymphatic: Aortoiliac atherosclerosis without aneurysm.  Lymphadenopathy is difficult to assess given paucity intra-abdominal fat and lack of IV contrast. Reproductive: Calcified uterine fibroids are noted. No adnexal mass is seen. Other: Moderate volume of ascites within the abdomen pelvis. Mild soft tissue anasarca. Musculoskeletal: No aggressive osseous lesions. Degenerative disc disease L5-S1. IMPRESSION: 1. Cardiomegaly with small bilateral pleural effusions. 6 mm nodular density in the right lower lobe adjacent to the major fissure. Non-contrast chest CT at 6-12 months is recommended. If the nodule is stable at time of repeat CT, then future CT at 18-24 months (from today's scan) is considered optional for low-risk patients, but is recommended for high-risk patients. This recommendation follows the consensus statement: Guidelines for Management of Incidental Pulmonary Nodules Detected on CT Images: From the Fleischner Society 2017; Radiology 2017; 284:228-243. 2. Morphologic appearance of cirrhosis with moderate ascites. A hypodense mass in the left hepatic lobe measuring 4.8 x 4.7 x 3.7 cm is new since prior comparison and concerning for neoplasm. 3. Gallbladder distention with hyperdense intraluminal masslike abnormalities noted within either representing tumefactive biliary sludge blood products or potentially intraluminal mass. The hyperdensity extends into what appears to be the common duct may be contributing to the patient's reported jaundice. 4. Nonobstructed, nondistended bowel. Circumferential thickening in the rectum may represent internal  hemorrhoids. 5. Calcified uterine fibroids. 6. Moderate-to-marked aortoiliac atherosclerosis. Electronically Signed   By: Ashley Royalty M.D.   On: 04/08/2018 01:39   Dg Chest 1 View  Result Date: 04/07/2018 CLINICAL DATA:  Weakness.  HIV. EXAM: CHEST  1 VIEW COMPARISON:  01/26/2018. FINDINGS: AP portable supine radiograph demonstrates elevated RIGHT hemidiaphragm. Perihilar opacity is new/increased from priors,  extending into the RIGHT-sided retrocardiac region, suspicious for RIGHT lower lobe pneumonia. Streaky LEFT basilar density probably atelectasis. No osseous findings of significance. Previous cervical fusion. Normal heart size. IMPRESSION: Worsening aeration from priors. Query RIGHT perihilar pneumonia with volume loss. Electronically Signed   By: Staci Righter M.D.   On: 04/07/2018 22:49   Ct Head Wo Contrast  Result Date: 04/08/2018 CLINICAL DATA:  Altered level of consciousness EXAM: CT HEAD WITHOUT CONTRAST TECHNIQUE: Contiguous axial images were obtained from the base of the skull through the vertex without intravenous contrast. COMPARISON:  04/11/2014 FINDINGS: Brain: Mild age related involutional changes of the brain. Minimal small vessel ischemic disease of periventricular white matter. No large vascular territory infarct hemorrhage or midline shift. No intra-axial mass nor extra-axial fluid collections. Vascular: No hyperdense vessel sign. Skull: No skull fracture. Sinuses/Orbits: Clear paranasal sinuses and mastoids. Intact orbits and globes. Other: None IMPRESSION: Chronic minimal small vessel ischemic disease of periventricular white matter. No acute intracranial abnormality. Electronically Signed   By: Ashley Royalty M.D.   On: 04/08/2018 01:22   Dg Chest Port 1 View  Result Date: 04/08/2018 CLINICAL DATA:  Central line placement EXAM: PORTABLE CHEST 1 VIEW COMPARISON:  04/07/2018 FINDINGS: Right IJ approach central line catheter is seen in the proximal right atrium. No pneumothorax. Heart size is normal. There is mild ectasia of the thoracic aorta. Slight improvement in aeration about the perihilar airspace opacity on the right. Bibasilar atelectasis is noted, left greater than right. ACDF of the included mid and lower cervical spine. IMPRESSION: New right IJ central line catheter is seen in the proximal right atrium without pneumothorax. Slightly improved aeration with respect infrahilar pulmonary  consolidation noted previously. Electronically Signed   By: Ashley Royalty M.D.   On: 04/08/2018 01:21   [facility] CTabd/pelvis 5/8: Hepatobiliary: Surface nodularity morphologically consistent with cirrhosis. Left hepatic lobe hypodense mass measuring 4.8 x 4.7 x 3.7 cm is noted, new since prior exam and concerning for hepatocellular carcinoma or potentially metastatic disease. The gallbladder is distended and contains intraluminal hyperdense noncalcified abnormalities. This appears to extend into the common duct and may be contributing to the patient's jaundice. Hyperdense biliary sludge, blood products or intraluminal gallbladder mass might account for this appearance.The urinary bladder is decompressed and somewhat thickened in appearance as a result. Stomach/Bowel: Physiologic distention of the stomach. No small bowel dilatation. No bowel obstruction. Sympathetic thickening of the intestine likely from surrounding ascites and edema. Circumferential rectal wall thickening possibly representing internal hemorrhoids. Vascular/Lymphatic: Aortoiliac atherosclerosis without aneurysm. Lymphadenopathy is difficult to assess given paucity intra-abdominal fat and lack of IV contrast. abd [place] 5/9:  ECHO 5/9>>>  Cultures BCX2 5/9>>> UC 5/9>>> Urine legionella 5/9>>>  ABX vanc x 1 5/8 Zosyn 5/9>>> Fluconazole 5/9>>>  Impression/plan  Septic shock complicated by hypovolemia.  Source of infection likely GI/with biliary obstruction -Now volume resuscitated, pressors weaned to off Plan Correct acidosis Follow-up urine and blood cultures Follow-up urinary Legionella antigen Day 2 Zosyn Abdominal ultrasound is pending  Possible non-ST elevation MI with newly identified systolic and diastolic heart murmur Plan Follow-up echocardiogram  Cardiology consult if has significant  echocardiogram findings  appears to have significant thrush I suspect she has esophageal candidiasis Plan We will  add antifungal coverage  New hepatic mass as well as masslike appearance in gallbladder and common bile duct -Total bilirubin, alk phos, and AST elevated Plan Abdominal ultrasound pending We will ask GI to see, if this is more fluid/sludge then mass may need biliary drainage percutaneously Bowel rest Follow-up LFTs  Renal failure with marked anion gap metabolic acidosis; felt hemodynamically mediated Plan  seen by nephrology Will place dialysis catheter and initiate CRRT Continue serial chemistries  Acute encephalopathy.  Suspect metabolic primarily, in the setting of uremia, acidosis and sepsis Plan Supportive care  Anemia, etiology unclear Plan Status post PRBCs This PAS to lower extremities Repeat CBC Transfuse for hemoglobin less than 7  Mild coagulopathy.  Suspect related to hepatic dysfunction as well as sepsis Has received FFP Plan Trend INR  HIV/AIDS; absolute CD4 count now 200 Plan Check viral load, I will ask infectious disease to see  Prior thyroidectomy Plan Follow-up TSH  Chronic pain with bilateral avascular necrosis of the hips Plan Supportive care  Pulmonary nodule Plan F/u imaging if survives.    DVT prophylaxis: scd SUP: na  Diet: NPO Activity: br Disposition : ICU critically ill   Critical care x45 minutes  Erick Colace ACNP-BC [place] Pager # [phone] OR # [phone] if no answer  Attending Note:  67 year old female with AIDS and poly substance abuse who presents to PCCM with AMS, liver failure, septic shock, acute renal failure, AIDS and metabolic acidosis.  On exam, she is alert enough to protect her airway and following commands with clear lungs.  I reviewed CXR myself, no acute disease noted but CT is consistent with mets in the liver with ?obstruction of bal bladder.  Discussed with PCCM-NP and nephrology MD.  Will place HD catheter and begin CRRT.  Patient does not need intubation at this time.  Will  give IVF to support BP since patient is hypotensive.  Abx as ordered.  ID consult for anti-retroviral medications and antibiotics.  Replace electrolytes as indicated.  Spoke with family, DNR status confirmed.  PCCM will continue to follow in the ICU.  The patient is critically ill with multiple organ systems failure and requires high complexity decision making for assessment and support, frequent evaluation and titration of therapies, application of advanced monitoring technologies and extensive interpretation of multiple databases.   Critical Care Time devoted to patient care services described in this note is  45  Minutes. This time reflects time of care of this signee Dr Jennet Maduro. This critical care time does not reflect procedure time, or teaching time or supervisory time of PA/NP/Med student/Med Resident etc but could involve care discussion time.  Rush Farmer, M.D. [facility] Pulmonary/Critical Care Medicine. Pager: [phone]. After hours pager: [phone].

## 2018-04-08 NOTE — ED Notes (Signed)
ED TO INPATIENT HANDOFF REPORT  Name/Age/Gender [name] 67 y.o. female  Code Status    Code Status Orders  (From admission, onward)        Start     Ordered   04/08/18 0106  Full code  Continuous     04/08/18 0108    Code Status History    Date Active Date Inactive Code Status Order ID Comments User Context   11/12/2016 1813 11/13/2016 2008 Full Code [id number]  Fatima Blank, MD ED      Home/SNF/Other Home  Chief Complaint faulire to thrive  Level of Care/Admitting Diagnosis ED Disposition    ED Disposition Condition [place]: [place] [[id number]]  Level of Care: ICU [6]  Diagnosis: Septic shock ([place]) [[id number]]  Admitting Physician: Kandice Hams [[id number]]  Attending Physician: Kandice Hams [[id number]]  Estimated length of stay: 3 - 4 days  Certification:: I certify this patient will need inpatient services for at least 2 midnights  PT Class (Do Not Modify): Inpatient [101]  PT Acc Code (Do Not Modify): Private [1]       Medical History Past Medical History:  Diagnosis Date  . Anxiety   . Arthritis   . Asthma   . Chronic bronchitis   . COPD (chronic obstructive pulmonary disease) ([place])   . Depression   . Fibromyalgia    "I think I got that"  . Headache(784.0)   . HIV (human immunodeficiency virus infection) ([place])    "dx'd ~ 12/2011"  . Lipoma    right forearm  . 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 Pneumonia ~ 2000's   "once"  . Shortness of breath on exertion   . Thyroid cancer s/p total thyroidectomy April2013 02/06/2012   Per patient report.  Recently diagnosed. Awaiting procedure to have thyroid removed.  Under treatment with Dr Janace Hoard .   Marland Kitchen Vaginal discharge 12/19/2015    Allergies Allergies  Allergen Reactions  . Sulfa Antibiotics Hives and Other (See Comments)    "in my shoulders; legs; feet; made me burn all over"     IV Location/Drains/Wounds Patient Lines/Drains/Airways Status   Active Line/Drains/Airways    Name:   Placement date:   Placement time:   Site:   Days:   Peripheral IV 04/07/18 Left Arm   04/07/18    2020    Arm   1   Peripheral IV 04/07/18 Right;Anterior Forearm   04/07/18    2100    Forearm   1   CVC Triple Lumen 04/07/18 Right Internal jugular 20 cm   04/07/18    2345     1   Urethral Catheter Bijou Easler RN Temperature probe 14 Fr.   04/08/18    0109    Temperature probe   less than 1          Labs/Imaging Results for orders placed or performed during the hospital encounter of 04/07/18 (from the past 48 hour(s))  Basic metabolic panel     Status: Abnormal   Collection Time: 04/07/18  8:42 PM  Result Value Ref Range   Sodium 136 135 - 145 mmol/L   Potassium 3.6 3.5 - 5.1 mmol/L   Chloride 100 (L) 101 - 111 mmol/L   CO2 13 (L) 22 - 32 mmol/L   Glucose, Bld 88 65 - 99 mg/dL   BUN 115 (H) 6 - 20 mg/dL  Comment: RESULTS CONFIRMED BY MANUAL DILUTION   Creatinine, Ser 12.06 (H) 0.44 - 1.00 mg/dL   Calcium 7.9 (L) 8.9 - 10.3 mg/dL   GFR calc non Af Amer 3 (L) >60 mL/min   GFR calc Af Amer 3 (L) >60 mL/min    Comment: (NOTE) The eGFR has been calculated using the CKD EPI equation. This calculation has not been validated in all clinical situations. eGFR's persistently <60 mL/min signify possible Chronic Kidney Disease.    Anion gap 23 (H) 5 - 15    Comment: Performed at [facility], [place] [address], [place], [place] [address]  CBC     Status: Abnormal   Collection Time: 04/07/18  8:42 PM  Result Value Ref Range   WBC 22.4 (H) 4.0 - 10.5 K/uL   RBC 1.89 (L) 3.87 - 5.11 MIL/uL   Hemoglobin 6.3 (LL) 12.0 - 15.0 g/dL    Comment: CRITICAL RESULT CALLED TO, READ BACK BY AND VERIFIED WITH: J INMAN,RN @2137  04/07/18 MKELLY REPEATED TO VERIFY    HCT 18.9 (L) 36.0 - 46.0 %   MCV 100.0 78.0 - 100.0 fL   MCH 33.3 26.0 - 34.0 pg   MCHC 33.3 30.0 - 36.0 g/dL    RDW 18.0 (H) 11.5 - 15.5 %   Platelets 355 150 - 400 K/uL    Comment: Performed at [facility], [place] [address] [place], [place] [address]  Urinalysis, Routine w reflex microscopic     Status: Abnormal   Collection Time: 04/07/18  8:42 PM  Result Value Ref Range   Color, Urine Mckelvin (A) YELLOW    Comment: BIOCHEMICALS MAY BE AFFECTED BY COLOR   APPearance CLOUDY (A) CLEAR   Specific Gravity, Urine 1.025 1.005 - 1.030   pH 6.5 5.0 - 8.0   Glucose, UA 100 (A) NEGATIVE mg/dL   Hgb urine dipstick TRACE (A) NEGATIVE   Bilirubin Urine LARGE (A) NEGATIVE   Ketones, ur TRACE (A) NEGATIVE mg/dL   Protein, ur 100 (A) NEGATIVE mg/dL   Nitrite POSITIVE (A) NEGATIVE   Leukocytes, UA NEGATIVE NEGATIVE    Comment: Performed at [facility], [place] [address] [place], [place] [address]  CK     Status: Abnormal   Collection Time: 04/07/18  8:42 PM  Result Value Ref Range   Total CK 643 (H) 38 - 234 U/L    Comment: Performed at [facility], [place] [address] [place], [place] [address]  Rapid urine drug screen (hospital performed)     Status: Abnormal   Collection Time: 04/07/18  8:42 PM  Result Value Ref Range   Opiates NONE DETECTED NONE DETECTED   Cocaine POSITIVE (A) NONE DETECTED   Benzodiazepines NONE DETECTED NONE DETECTED   Amphetamines NONE DETECTED NONE DETECTED   Tetrahydrocannabinol NONE DETECTED NONE DETECTED   Barbiturates NONE DETECTED NONE DETECTED    Comment: (NOTE) DRUG SCREEN FOR MEDICAL PURPOSES ONLY.  IF CONFIRMATION IS NEEDED FOR ANY PURPOSE, NOTIFY LAB WITHIN 5 DAYS. LOWEST DETECTABLE LIMITS FOR URINE DRUG SCREEN Drug Class                     Cutoff (ng/mL) Amphetamine and metabolites    1000 Barbiturate and metabolites    200 Benzodiazepine                 038 Tricyclics and metabolites     300 Opiates and metabolites        300  Cocaine and metabolites        300 THC                            50 Performed  at [facility], [place] [address], [place], [place] [address]   Urinalysis, Microscopic (reflex)     Status: Abnormal   Collection Time: 04/07/18  8:42 PM  Result Value Ref Range   RBC / HPF NONE SEEN 0 - 5 RBC/hpf   WBC, UA 0-5 0 - 5 WBC/hpf   Bacteria, UA FEW (A) NONE SEEN   Squamous Epithelial / LPF 0-5 0 - 5    Comment: Performed at [facility], [place] [address] [place], [place] [address]  Ethanol     Status: None   Collection Time: 04/07/18  9:19 PM  Result Value Ref Range   Alcohol, Ethyl (B) <10 <10 mg/dL    Comment: Performed at [facility], [place] [address] [place], [place] [address]  CBG monitoring, ED     Status: None   Collection Time: 04/07/18  9:20 PM  Result Value Ref Range   Glucose-Capillary 86 65 - 99 mg/dL  I-Stat CG4 Lactic Acid, ED     Status: None   Collection Time: 04/07/18  9:21 PM  Result Value Ref Range   Lactic Acid, Venous 1.22 0.5 - 1.9 mmol/L  Comprehensive metabolic panel     Status: Abnormal   Collection Time: 04/07/18  9:39 PM  Result Value Ref Range   Sodium 136 135 - 145 mmol/L   Potassium 3.6 3.5 - 5.1 mmol/L   Chloride 102 101 - 111 mmol/L   CO2 13 (L) 22 - 32 mmol/L   Glucose, Bld 89 65 - 99 mg/dL   BUN 113 (H) 6 - 20 mg/dL    Comment: RESULTS CONFIRMED BY MANUAL DILUTION   Creatinine, Ser 12.10 (H) 0.44 - 1.00 mg/dL   Calcium 7.7 (L) 8.9 - 10.3 mg/dL   Total Protein 5.9 (L) 6.5 - 8.1 g/dL   Albumin 1.8 (L) 3.5 - 5.0 g/dL   AST 111 (H) 15 - 41 U/L   ALT 45 14 - 54 U/L   Alkaline Phosphatase 764 (H) 38 - 126 U/L   Total Bilirubin 48.9 (HH) 0.3 - 1.2 mg/dL    Comment: RESULTS CONFIRMED BY MANUAL DILUTION CRITICAL RESULT CALLED TO, READ BACK BY AND VERIFIED WITHMilas Gain RN 2440 04/17/18 A NAVARRO    GFR calc non Af Amer 3 (L) >60 mL/min   GFR calc Af Amer 3 (L) >60 mL/min    Comment: (NOTE) The eGFR has been calculated using the CKD EPI equation. This calculation has not been  validated in all clinical situations. eGFR's persistently <60 mL/min signify possible Chronic Kidney Disease.    Anion gap 21 (H) 5 - 15    Comment: Performed at [facility], [place] [address], [place], [place] [address]  CBC WITH DIFFERENTIAL     Status: Abnormal   Collection Time: 04/07/18  9:39 PM  Result Value Ref Range   WBC 23.4 (H) 4.0 - 10.5 K/uL    Comment: WHITE COUNT CONFIRMED ON SMEAR   RBC 1.83 (L) 3.87 - 5.11 MIL/uL   Hemoglobin 6.1 (LL) 12.0 - 15.0 g/dL    Comment: CRITICAL VALUE NOTED.  VALUE IS CONSISTENT WITH PREVIOUSLY REPORTED AND CALLED VALUE.   HCT 18.3 (L) 36.0 - 46.0 %  MCV 100.0 78.0 - 100.0 fL   MCH 33.3 26.0 - 34.0 pg   MCHC 33.3 30.0 - 36.0 g/dL   RDW 18.1 (H) 11.5 - 15.5 %   Platelets 323 150 - 400 K/uL    Comment: SPECIMEN CHECKED FOR CLOTS PLATELET COUNT CONFIRMED BY SMEAR    Neutrophils Relative % 82 %   Lymphocytes Relative 7 %   Monocytes Relative 3 %   Eosinophils Relative 1 %   Basophils Relative 1 %   Band Neutrophils 0 %   Metamyelocytes Relative 2 %   Myelocytes 2 %   Promyelocytes Relative 2 %   Blasts 0 %   nRBC 0 0 /100 WBC   Other 0 %   Neutro Abs 20.7 (H) 1.7 - 7.7 K/uL   Lymphs Abs 1.6 0.7 - 4.0 K/uL   Monocytes Absolute 0.7 0.1 - 1.0 K/uL   Eosinophils Absolute 0.2 0.0 - 0.7 K/uL   Basophils Absolute 0.2 (H) 0.0 - 0.1 K/uL   RBC Morphology POLYCHROMASIA PRESENT     Comment: [employer] Performed at [facility], [place] [address] [place], [place] [address]   Protime-INR     Status: Abnormal   Collection Time: 04/07/18  9:39 PM  Result Value Ref Range   Prothrombin Time 19.3 (H) 11.4 - 15.2 seconds   INR 1.65     Comment: Performed at [facility], [place] [address], [place], [place] [address]  Ammonia     Status: Abnormal   Collection Time: 04/07/18  9:39 PM  Result Value Ref Range   Ammonia 67 (H) 9 - 35 umol/L    Comment: Performed at [facility], [place] [address] [place], [place] [address]  Blood gas, venous     Status: Abnormal   Collection Time: 04/07/18  9:47 PM  Result Value Ref Range   pH, Ven 7.148 (LL) 7.250 - 7.430    Comment: CRITICAL RESULT CALLED TO, READ BACK BY AND VERIFIED WITH: Edd Fabian, RN AT 2151 BY TABATHA KNAPP, RRT,RCP ON 04/07/18    pCO2, Ven 35.7 (L) 44.0 - 60.0 mmHg   pO2, Ven VALUE BELOW REPORTABLE RANGE 32.0 - 45.0 mmHg    Comment: CRITICAL RESULT CALLED TO, READ BACK BY AND VERIFIED WITH: Edd Fabian, RN AT 2151 BY TABATHA KNAPP, RRT,RCP ON 04/07/18    Bicarbonate 11.9 (L) 20.0 - 28.0 mmol/L   Acid-base deficit 15.4 (H) 0.0 - 2.0 mmol/L   O2 Saturation 25.3 %   Patient temperature 98.6    Collection site VENOUS    Drawn by  Edd Fabian    Sample type VENOUS     Comment: Performed at [facility], [place] [address] [place], [place] [address]  Type and screen [place]     Status: None (Preliminary result)   Collection Time: 04/07/18 10:04 PM  Result Value Ref Range   ABO/RH(D) O POS    Antibody Screen NEG    Sample Expiration 04/10/2018    Unit Number [id number]    Blood Component Type RBC LR PHER1    Unit division 00    Status of Unit ISSUED    Transfusion Status OK TO TRANSFUSE    Crossmatch Result Compatible    Unit Number [id number]    Blood Component Type RBC LR PHER2    Unit division 00    Status of Unit ISSUED    Transfusion Status OK TO TRANSFUSE    Crossmatch  Result Compatible    Unit Number [id number]    Blood Component Type RED CELLS,LR    Unit division 00    Status of Unit ALLOCATED    Transfusion Status OK TO TRANSFUSE    Crossmatch Result Compatible    Unit Number [id number]    Blood Component Type RED CELLS,LR    Unit division 00    Status of Unit ISSUED    Transfusion Status OK TO TRANSFUSE    Crossmatch Result      Compatible Performed at [place] [address] [place], [place]  [address]   Prepare RBC     Status: None   Collection Time: 04/07/18 10:04 PM  Result Value Ref Range   Order Confirmation      ORDER PROCESSED BY BLOOD BANK Performed at [facility], [place] [address] [place], [place] [address]   ABO/Rh     Status: None   Collection Time: 04/07/18 10:04 PM  Result Value Ref Range   ABO/RH(D)      Jenetta Downer POS Performed at [facility], [place] [address] [place], [place] [address]   Troponin I     Status: Abnormal   Collection Time: 04/07/18 10:18 PM  Result Value Ref Range   Troponin I 0.39 (HH) <0.03 ng/mL    Comment: CRITICAL RESULT CALLED TO, READ BACK BY AND VERIFIED WITHArnold Long RN 2248 04/07/18 A NAVARRO Performed at [facility], [place] [address] [place], [place] [address]   I-Stat CG4 Lactic Acid, ED     Status: None   Collection Time: 04/07/18 11:51 PM  Result Value Ref Range   Lactic Acid, Venous 0.74 0.5 - 1.9 mmol/L  I-Stat Troponin, ED (not at [facility])     Status: Abnormal   Collection Time: 04/08/18  1:41 AM  Result Value Ref Range   Troponin i, poc 0.25 (HH) 0.00 - 0.08 ng/mL   Comment NOTIFIED PHYSICIAN    Comment 3            Comment: Due to the release kinetics of cTnI, a negative result within the first hours of the onset of symptoms does not rule out myocardial infarction with certainty. If myocardial infarction is still suspected, repeat the test at appropriate intervals.   Prepare fresh frozen plasma     Status: None (Preliminary result)   Collection Time: 04/08/18  2:06 AM  Result Value Ref Range   Unit Number [id number]    Blood Component Type THAWED PLASMA    Unit division 00    Status of Unit ISSUED    Transfusion Status OK TO TRANSFUSE    Unit Number [id number]    Blood Component Type THAWED PLASMA    Unit division 00    Status of Unit ISSUED    Transfusion Status      OK TO TRANSFUSE Performed at [place] [address],  [place], [place] [address]   Prepare RBC     Status: None   Collection Time: 04/08/18  2:07 AM  Result Value Ref Range   Order Confirmation      ORDER PROCESSED BY BLOOD BANK Performed at [facility], [place] [address], [place],  [address]    Ct Abdomen Pelvis Wo Contrast  Result Date: 04/08/2018 CLINICAL DATA:  Failure to thrive the past 4 days. Jaundice and incontinence. Leukocytosis. EXAM: CT ABDOMEN AND PELVIS WITHOUT CONTRAST TECHNIQUE: Multidetector CT imaging of the abdomen and pelvis  was performed following the standard protocol without IV contrast. COMPARISON:  05/09/2012 CT FINDINGS: Lower chest: Tip of central line catheter is seen in the proximal right atrium. Mild cardiomegaly without pericardial effusion. Bilateral small pleural effusions with atelectasis at each lung base. 6 mm nodular density in the right lower lobe adjacent to the major fissure, series 2/2. Hepatobiliary: Surface nodularity morphologically consistent with cirrhosis. Left hepatic lobe hypodense mass measuring 4.8 x 4.7 x 3.7 cm is noted, new since prior exam and concerning for hepatocellular carcinoma or potentially metastatic disease. The gallbladder is distended and contains intraluminal hyperdense noncalcified abnormalities. This appears to extend into the common duct and may be contributing to the patient's jaundice. Hyperdense biliary sludge, blood products or intraluminal gallbladder mass might account for this appearance. Pancreas: The pancreas appears atrophic without ductal dilatation. Spleen: No splenomegaly. Adrenals/Urinary Tract: No adrenal mass. There is left adrenal limb thickening. Kidneys are visualized without nephrolithiasis nor obstructive uropathy. No discrete renal mass is identified. The urinary bladder is decompressed and somewhat thickened in appearance as a result. Stomach/Bowel: Physiologic distention of the stomach. No small bowel dilatation. No bowel obstruction. Sympathetic  thickening of the intestine likely from surrounding ascites and edema. Circumferential rectal wall thickening possibly representing internal hemorrhoids. Vascular/Lymphatic: Aortoiliac atherosclerosis without aneurysm. Lymphadenopathy is difficult to assess given paucity intra-abdominal fat and lack of IV contrast. Reproductive: Calcified uterine fibroids are noted. No adnexal mass is seen. Other: Moderate volume of ascites within the abdomen pelvis. Mild soft tissue anasarca. Musculoskeletal: No aggressive osseous lesions. Degenerative disc disease L5-S1. IMPRESSION: 1. Cardiomegaly with small bilateral pleural effusions. 6 mm nodular density in the right lower lobe adjacent to the major fissure. Non-contrast chest CT at 6-12 months is recommended. If the nodule is stable at time of repeat CT, then future CT at 18-24 months (from today's scan) is considered optional for low-risk patients, but is recommended for high-risk patients. This recommendation follows the consensus statement: Guidelines for Management of Incidental Pulmonary Nodules Detected on CT Images: From the Fleischner Society 2017; Radiology 2017; 284:228-243. 2. Morphologic appearance of cirrhosis with moderate ascites. A hypodense mass in the left hepatic lobe measuring 4.8 x 4.7 x 3.7 cm is new since prior comparison and concerning for neoplasm. 3. Gallbladder distention with hyperdense intraluminal masslike abnormalities noted within either representing tumefactive biliary sludge blood products or potentially intraluminal mass. The hyperdensity extends into what appears to be the common duct may be contributing to the patient's reported jaundice. 4. Nonobstructed, nondistended bowel. Circumferential thickening in the rectum may represent internal hemorrhoids. 5. Calcified uterine fibroids. 6. Moderate-to-marked aortoiliac atherosclerosis. Electronically Signed   By: Ashley Royalty M.D.   On: 04/08/2018 01:39   Dg Chest 1 View  Result Date:  04/07/2018 CLINICAL DATA:  Weakness.  HIV. EXAM: CHEST  1 VIEW COMPARISON:  01/26/2018. FINDINGS: AP portable supine radiograph demonstrates elevated RIGHT hemidiaphragm. Perihilar opacity is new/increased from priors, extending into the RIGHT-sided retrocardiac region, suspicious for RIGHT lower lobe pneumonia. Streaky LEFT basilar density probably atelectasis. No osseous findings of significance. Previous cervical fusion. Normal heart size. IMPRESSION: Worsening aeration from priors. Query RIGHT perihilar pneumonia with volume loss. Electronically Signed   By: Staci Righter M.D.   On: 04/07/2018 22:49   Ct Head Wo Contrast  Result Date: 04/08/2018 CLINICAL DATA:  Altered level of consciousness EXAM: CT HEAD WITHOUT CONTRAST TECHNIQUE: Contiguous axial images were obtained from the base of the skull through the vertex without intravenous contrast. COMPARISON:  04/11/2014 FINDINGS: Brain:  Mild age related involutional changes of the brain. Minimal small vessel ischemic disease of periventricular white matter. No large vascular territory infarct hemorrhage or midline shift. No intra-axial mass nor extra-axial fluid collections. Vascular: No hyperdense vessel sign. Skull: No skull fracture. Sinuses/Orbits: Clear paranasal sinuses and mastoids. Intact orbits and globes. Other: None IMPRESSION: Chronic minimal small vessel ischemic disease of periventricular white matter. No acute intracranial abnormality. Electronically Signed   By: Ashley Royalty M.D.   On: 04/08/2018 01:22   Dg Chest Port 1 View  Result Date: 04/08/2018 CLINICAL DATA:  Central line placement EXAM: PORTABLE CHEST 1 VIEW COMPARISON:  04/07/2018 FINDINGS: Right IJ approach central line catheter is seen in the proximal right atrium. No pneumothorax. Heart size is normal. There is mild ectasia of the thoracic aorta. Slight improvement in aeration about the perihilar airspace opacity on the right. Bibasilar atelectasis is noted, left greater than right.  ACDF of the included mid and lower cervical spine. IMPRESSION: New right IJ central line catheter is seen in the proximal right atrium without pneumothorax. Slightly improved aeration with respect infrahilar pulmonary consolidation noted previously. Electronically Signed   By: Ashley Royalty M.D.   On: 04/08/2018 01:21    Pending Labs Unresulted Labs (From admission, onward)   Start     Ordered   04/08/18 0600  Troponin I  Now then every 6 hours,   R     04/08/18 0318   04/08/18 0600  TSH  Once,   R     04/08/18 0320   04/08/18 0600  CK  Once,   R     04/08/18 0322   04/08/18 0500  CBC  Tomorrow morning,   R     04/08/18 0108   04/08/18 2620  Basic metabolic panel  Tomorrow morning,   R     04/08/18 0108   04/08/18 0500  Magnesium  Tomorrow morning,   R     04/08/18 0108   04/08/18 0500  Phosphorus  Tomorrow morning,   R     04/08/18 0108   04/08/18 0500  Ammonia  Tomorrow morning,   R     04/08/18 0110   04/08/18 0500  Hepatic function panel  Tomorrow morning,   R     04/08/18 0110   04/08/18 0125  Vancomycin, random  Once,   R     04/08/18 0124   04/08/18 0120  Troponin I  Once,   STAT     04/07/18 2355   04/08/18 0108  Strep pneumoniae urinary antigen  (not at [facility])  Once,   R     04/08/18 0108   04/08/18 0108  Legionella Pneumophila Serogp 1 Ur Ag  Once,   R     04/08/18 0108   04/08/18 0107  Procalcitonin  Add-on,   R     04/08/18 0108   04/08/18 0107  Cortisol  Add-on,   STAT     04/08/18 0108   04/07/18 2122  T-helper cells (CD4) count (not at [facility])  Once,   R     04/07/18 2122   04/07/18 2118  Urinalysis, Complete w Microscopic  STAT,   STAT     04/07/18 2119   04/07/18 2118  Urine culture  STAT,   STAT     04/07/18 2119   04/07/18 2118  Blood Cultures (routine x 2)  BLOOD CULTURE X 2,   STAT     04/07/18 2119      Vitals/Pain Today's  Vitals   04/08/18 0621 04/08/18 0630 04/08/18 0642 04/08/18 0645  BP: 122/76 118/74 118/74 118/80  Pulse: 64 65 65 64  Resp: 14  15 14 12   Temp: (!) 96.3 F (35.7 C) (!) 96.3 F (35.7 C) (!) 96.4 F (35.8 C) (!) 96.4 F (35.8 C)  TempSrc: Core  Core   SpO2:  99%  98%  Weight:        Isolation Precautions No active isolations  Medications Medications  0.9 %  sodium chloride infusion (has no administration in time range)  sodium bicarbonate 150 mEq in dextrose 5 % 1,000 mL infusion ( Intravenous New Bag/Given 04/08/18 0140)  0.9 %  sodium chloride infusion (has no administration in time range)  famotidine (PEPCID) 10 mg in sodium chloride 0.9 % 25 mL (0 mg Intravenous Stopped 04/08/18 0214)  0.9 %  sodium chloride infusion (has no administration in time range)  piperacillin-tazobactam (ZOSYN) IVPB 2.25 g (has no administration in time range)  0.9 %  sodium chloride infusion (has no administration in time range)  sodium chloride 0.9 % bolus 1,000 mL (0 mLs Intravenous Stopped 04/07/18 2231)  sodium chloride 0.9 % bolus 1,000 mL (0 mLs Intravenous Stopped 04/07/18 2210)  vancomycin (VANCOCIN) 1,250 mg in sodium chloride 0.9 % 250 mL IVPB (0 mg Intravenous Stopped 04/08/18 0030)  piperacillin-tazobactam (ZOSYN) IVPB 2.25 g (0 g Intravenous Stopped 04/07/18 2256)  sodium chloride 0.9 % bolus 1,000 mL (0 mLs Intravenous Stopped 04/07/18 2240)  norepinephrine (LEVOPHED) 4 mg in dextrose 5 % 250 mL (0.016 mg/mL) infusion (10 mcg/min Intravenous New Bag/Given 04/08/18 0558)  sodium bicarbonate injection 50 mEq (50 mEq Intravenous Given 04/07/18 2246)  lactated ringers bolus 2,000 mL (0 mLs Intravenous Stopped 04/08/18 0527)  albumin human 25 % solution 12.5 g (0 g Intravenous Stopped 04/08/18 0249)    Mobility non-ambulatory

## 2018-04-08 NOTE — ED Notes (Signed)
[name], pt's sister, requesting update on pt admission and/or transfer [phone]

## 2018-04-08 NOTE — Consult Note (Signed)
Referring Provider:  Critical care Primary Care Physician:  Elwyn Reach, MD Primary Gastroenterologist:  Althia Forts  Reason for Consultation:  Abdominal pain, anemia, jaundice, abnormal CT  HPI: [name] is a 67 y.o. female with past medical history significant for HIV followed by infectious disease with recent CD4 count of 200, history of avascular necrosis of bilateral hips, history of polysubstance use,  history of COPD and fibromyalgia brought in to the emergency room for further evaluation of altered mental status and poor oral intake. Upon initial evaluation she was found to have acute kidney injury with creatinine of around 12, anemia with hemoglobin of 6.1, elevated LFTs with AST 111, ALT 45, alkaline phosphatase 764, and total bili 48.9. INR 1.65. CT abdomen pelvis without contrast showed changes of liver cirrhosis as well as 4.8 cm left hepatic lobe lesion concerning for a potential carcinoma or metastatic disease. CT scan also showed gallbladder distention with hyperdense intraluminal lesion that extends into the common bile duct. GI is consulted for further evaluation.  Patient seen and examined at bedside. Patient is currently lethargic. Unable to obtain history from the patient. Discussed with the nursing staff. Patient is having loose stools which are nonbloody. No nausea or vomiting. Generalized abdominal discomfort.  Past Medical History:  Diagnosis Date  . Anxiety   . Arthritis   . Asthma   . Chronic bronchitis   . COPD (chronic obstructive pulmonary disease) ([place])   . Depression   . Fibromyalgia    "I think I got that"  . Headache(784.0)   . HIV (human immunodeficiency virus infection) ([place])    "dx'd ~ 12/2011"  . Lipoma    right forearm  . 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 Pneumonia ~ 2000's   "once"  . Shortness of breath on exertion   . Thyroid cancer s/p total thyroidectomy April2013 02/06/2012    Per patient report.  Recently diagnosed. Awaiting procedure to have thyroid removed.  Under treatment with Dr Janace Hoard .   Marland Kitchen Vaginal discharge 12/19/2015    Past Surgical History:  Procedure Laterality Date  . ANTERIOR CERVICAL DECOMP/DISCECTOMY FUSION  05/2009   C3-4; C5-6  . DILATION AND CURETTAGE OF UTERUS    . THYROIDECTOMY  03/11/12   bilaterally  . THYROIDECTOMY  03/11/2012   Procedure: THYROIDECTOMY;  Surgeon: Melissa Montane, MD;  Location: [facility];  Service: ENT;  Laterality: Bilateral;    Prior to Admission medications   Medication Sig Start Date End Date Taking? Authorizing Provider  albuterol (PROVENTIL HFA;VENTOLIN HFA) 108 (90 Base) MCG/ACT inhaler Inhale 2 puffs into the lungs every 4 (four) hours as needed for wheezing or shortness of breath. 04/24/17  Yes Cardama, Grayce Sessions, MD  atazanavir-cobicistat (EVOTAZ) 300-150 MG tablet Take 1 tablet by mouth daily with breakfast. Swallow whole. Do NOT crush, cut or chew tablet. Take with food. 02/24/18  Yes Tommy Medal, Lavell Islam, MD  citalopram (CELEXA) 20 MG tablet Take 1 tablet (20 mg total) by mouth daily. Patient taking differently: Take 20-40 mg by mouth daily.  09/30/17 09/30/18 Yes Truman Hayward, MD  emtricitabine-tenofovir AF (DESCOVY) 200-25 MG tablet Take 1 tablet by mouth daily. 02/24/18  Yes Tommy Medal, Lavell Islam, MD  gabapentin (NEURONTIN) 300 MG capsule Take 1 capsule (300 mg total) by mouth 3 (three) times daily. Patient taking differently: Take 300 mg by mouth 2 (two) times daily as needed (nerve pain).  09/30/17  Yes Lucianne Lei  Dam, Lavell Islam, MD  ibuprofen (ADVIL,MOTRIN) 600 MG tablet Take 600 mg by mouth every 8 (eight) hours as needed for moderate pain.  01/04/18  Yes [provider]  megestrol (MEGACE) 20 MG tablet Take 20 mg by mouth daily.   Yes [provider]  naproxen sodium (ALEVE) 220 MG tablet Take 220 mg by mouth 2 (two) times daily as needed (pain).    Yes [provider]  SYNTHROID 150  MCG tablet Take 1 tablet (150 mcg total) by mouth daily. 02/24/18  Yes Truman Hayward, MD    Scheduled Meds: . chlorhexidine  15 mL Mouth Rinse BID  . Chlorhexidine Gluconate Cloth  6 each Topical Q0600  . mouth rinse  15 mL Mouth Rinse q12n4p   Continuous Infusions: . sodium chloride    . sodium chloride    . sodium chloride    . sodium chloride    . famotidine (PEPCID) IV Stopped (04/08/18 0214)  . fluconazole (DIFLUCAN) IV 400 mg (04/08/18 1340)  . piperacillin-tazobactam (ZOSYN)  IV 2.25 g (04/08/18 1349)  . dialysis replacement fluid (prismasate)    . dialysis replacement fluid (prismasate)    . dialysate (PRISMASATE)    .  sodium bicarbonate  infusion 1000 mL 125 mL/hr at 04/08/18 1014  . sodium chloride     PRN Meds:.sodium chloride, heparin, sodium chloride  Allergies as of 04/07/2018 - Review Complete 04/07/2018  Allergen Reaction Noted  . Sulfa antibiotics Hives and Other (See Comments) 12/15/2011    Family History  Problem Relation Age of Onset  . Heart disease Maternal Uncle     Social History   Socioeconomic History  . Marital status: Divorced    Spouse name: Not on file  . Number of children: Not on file  . Years of education: 72  . Highest education level: Not on file  Occupational History  . Not on file  Social Needs  . Financial resource strain: Not on file  . Food insecurity:    Worry: Not on file    Inability: Not on file  . Transportation needs:    Medical: Not on file    Non-medical: Not on file  Tobacco Use  . Smoking status: Current Every Day Smoker    Packs/day: 0.50    Years: 47.00    Pack years: 23.50    Types: Cigarettes  . Smokeless tobacco: Former Systems developer    Quit date: 12/01/1974  . Tobacco comment: currently smokes   Substance and Sexual Activity  . Alcohol use: No    Comment: 03/11/12 "no beer or wine ~ 01/2012"  . Drug use: No    Frequency: 1.0 times per week    Types: Marijuana, Cocaine    Comment: 03/11/12 "last drug  use early /2013"  . Sexual activity: Not Currently    Partners: Male    Birth control/protection: None, Post-menopausal  Lifestyle  . Physical activity:    Days per week: Not on file    Minutes per session: Not on file  . Stress: Not on file  Relationships  . Social connections:    Talks on phone: Not on file    Gets together: Not on file    Attends religious service: Not on file    Active member of club or organization: Not on file    Attends meetings of clubs or organizations: Not on file    Relationship status: Not on file  . Intimate partner violence:    Fear of current  or ex partner: Not on file    Emotionally abused: Not on file    Physically abused: Not on file    Forced sexual activity: Not on file  Other Topics Concern  . Not on file  Social History Narrative   Regular exercise-yes (walking)   Caffeine Use-yes          Review of Systems: not able to obtain  Physical Exam: Vital signs: Vitals:   04/08/18 1330 04/08/18 1345  BP: 101/71 99/74  Pulse: 71 72  Resp: 15 14  Temp:    SpO2: 93% 94%   Last BM Date: 04/08/18 Physical Exam  Constitutional: She appears well-nourished. No distress.  HENT:  Head: Normocephalic and atraumatic.  Eyes: EOM are normal. Scleral icterus is present.  Neck: Normal range of motion. Neck supple.  Cardiovascular: Normal rate.  Murmur heard. Pulmonary/Chest:  Decreased breath sounds bilaterally. No acute respiratory distress  Abdominal:  Abdomen is distended. Generalized discomfort on palpation. No rebound or guarding. Bowel sounds hypoactive.  Musculoskeletal: Normal range of motion. She exhibits no edema.  Neurological:  Confused. Lethargic  Skin: Skin is dry. No erythema.  Psychiatric:  Confused.  Vitals reviewed.   GI:  Lab Results: Recent Labs    04/07/18 2042 04/07/18 2139  WBC 22.4* 23.4*  HGB 6.3* 6.1*  HCT 18.9* 18.3*  PLT 355 323   BMET Recent Labs    04/07/18 2042 04/07/18 2139  NA 136 136  K  3.6 3.6  CL 100* 102  CO2 13* 13*  GLUCOSE 88 89  BUN 115* 113*  CREATININE 12.06* 12.10*  CALCIUM 7.9* 7.7*   LFT Recent Labs    04/07/18 2139  PROT 5.9*  ALBUMIN 1.8*  AST 111*  ALT 45  ALKPHOS 764*  BILITOT 48.9*   PT/INR Recent Labs    04/07/18 2139  LABPROT 19.3*  INR 1.65     Studies/Results: Ct Abdomen Pelvis Wo Contrast  Result Date: 04/08/2018 CLINICAL DATA:  Failure to thrive the past 4 days. Jaundice and incontinence. Leukocytosis. EXAM: CT ABDOMEN AND PELVIS WITHOUT CONTRAST TECHNIQUE: Multidetector CT imaging of the abdomen and pelvis was performed following the standard protocol without IV contrast. COMPARISON:  05/09/2012 CT FINDINGS: Lower chest: Tip of central line catheter is seen in the proximal right atrium. Mild cardiomegaly without pericardial effusion. Bilateral small pleural effusions with atelectasis at each lung base. 6 mm nodular density in the right lower lobe adjacent to the major fissure, series 2/2. Hepatobiliary: Surface nodularity morphologically consistent with cirrhosis. Left hepatic lobe hypodense mass measuring 4.8 x 4.7 x 3.7 cm is noted, new since prior exam and concerning for hepatocellular carcinoma or potentially metastatic disease. The gallbladder is distended and contains intraluminal hyperdense noncalcified abnormalities. This appears to extend into the common duct and may be contributing to the patient's jaundice. Hyperdense biliary sludge, blood products or intraluminal gallbladder mass might account for this appearance. Pancreas: The pancreas appears atrophic without ductal dilatation. Spleen: No splenomegaly. Adrenals/Urinary Tract: No adrenal mass. There is left adrenal limb thickening. Kidneys are visualized without nephrolithiasis nor obstructive uropathy. No discrete renal mass is identified. The urinary bladder is decompressed and somewhat thickened in appearance as a result. Stomach/Bowel: Physiologic distention of the stomach. No  small bowel dilatation. No bowel obstruction. Sympathetic thickening of the intestine likely from surrounding ascites and edema. Circumferential rectal wall thickening possibly representing internal hemorrhoids. Vascular/Lymphatic: Aortoiliac atherosclerosis without aneurysm. Lymphadenopathy is difficult to assess given paucity intra-abdominal fat and lack of IV  contrast. Reproductive: Calcified uterine fibroids are noted. No adnexal mass is seen. Other: Moderate volume of ascites within the abdomen pelvis. Mild soft tissue anasarca. Musculoskeletal: No aggressive osseous lesions. Degenerative disc disease L5-S1. IMPRESSION: 1. Cardiomegaly with small bilateral pleural effusions. 6 mm nodular density in the right lower lobe adjacent to the major fissure. Non-contrast chest CT at 6-12 months is recommended. If the nodule is stable at time of repeat CT, then future CT at 18-24 months (from today's scan) is considered optional for low-risk patients, but is recommended for high-risk patients. This recommendation follows the consensus statement: Guidelines for Management of Incidental Pulmonary Nodules Detected on CT Images: From the Fleischner Society 2017; Radiology 2017; 284:228-243. 2. Morphologic appearance of cirrhosis with moderate ascites. A hypodense mass in the left hepatic lobe measuring 4.8 x 4.7 x 3.7 cm is new since prior comparison and concerning for neoplasm. 3. Gallbladder distention with hyperdense intraluminal masslike abnormalities noted within either representing tumefactive biliary sludge blood products or potentially intraluminal mass. The hyperdensity extends into what appears to be the common duct may be contributing to the patient's reported jaundice. 4. Nonobstructed, nondistended bowel. Circumferential thickening in the rectum may represent internal hemorrhoids. 5. Calcified uterine fibroids. 6. Moderate-to-marked aortoiliac atherosclerosis. Electronically Signed   By: Ashley Royalty M.D.   On:  04/08/2018 01:39   Dg Chest 1 View  Result Date: 04/07/2018 CLINICAL DATA:  Weakness.  HIV. EXAM: CHEST  1 VIEW COMPARISON:  01/26/2018. FINDINGS: AP portable supine radiograph demonstrates elevated RIGHT hemidiaphragm. Perihilar opacity is new/increased from priors, extending into the RIGHT-sided retrocardiac region, suspicious for RIGHT lower lobe pneumonia. Streaky LEFT basilar density probably atelectasis. No osseous findings of significance. Previous cervical fusion. Normal heart size. IMPRESSION: Worsening aeration from priors. Query RIGHT perihilar pneumonia with volume loss. Electronically Signed   By: Staci Righter M.D.   On: 04/07/2018 22:49   Ct Head Wo Contrast  Result Date: 04/08/2018 CLINICAL DATA:  Altered level of consciousness EXAM: CT HEAD WITHOUT CONTRAST TECHNIQUE: Contiguous axial images were obtained from the base of the skull through the vertex without intravenous contrast. COMPARISON:  04/11/2014 FINDINGS: Brain: Mild age related involutional changes of the brain. Minimal small vessel ischemic disease of periventricular white matter. No large vascular territory infarct hemorrhage or midline shift. No intra-axial mass nor extra-axial fluid collections. Vascular: No hyperdense vessel sign. Skull: No skull fracture. Sinuses/Orbits: Clear paranasal sinuses and mastoids. Intact orbits and globes. Other: None IMPRESSION: Chronic minimal small vessel ischemic disease of periventricular white matter. No acute intracranial abnormality. Electronically Signed   By: Ashley Royalty M.D.   On: 04/08/2018 01:22   US Renal  Result Date: 04/08/2018 CLINICAL DATA:  67 year old female with a history of acute renal disease EXAM: RENAL / URINARY TRACT ULTRASOUND COMPLETE COMPARISON:  None. FINDINGS: Right Kidney: Length: 13.3 cm. Echogenicity within normal limits. No mass or hydronephrosis visualized. Left Kidney: Length: 12.2 cm. Echogenicity within normal limits. No mass or hydronephrosis visualized.  Additional: Multiple mixed echogenicity liver masses are present. Ascites. Bladder: Appears normal for degree of bladder distention. IMPRESSION: No evidence of hydronephrosis. Multiple liver masses, compatible with metastatic disease. Ascites. These results were called by telephone at the time of interpretation on 04/08/2018 at 1:37 pm to Marni Griffon who verbally acknowledged these results. Electronically Signed   By: Corrie Mckusick D.O.   On: 04/08/2018 13:39   Dg Chest Port 1 View  Result Date: 04/08/2018 CLINICAL DATA:  Central line placement EXAM: PORTABLE CHEST 1 VIEW  COMPARISON:  04/07/2018 FINDINGS: Right IJ approach central line catheter is seen in the proximal right atrium. No pneumothorax. Heart size is normal. There is mild ectasia of the thoracic aorta. Slight improvement in aeration about the perihilar airspace opacity on the right. Bibasilar atelectasis is noted, left greater than right. ACDF of the included mid and lower cervical spine. IMPRESSION: New right IJ central line catheter is seen in the proximal right atrium without pneumothorax. Slightly improved aeration with respect infrahilar pulmonary consolidation noted previously. Electronically Signed   By: Ashley Royalty M.D.   On: 04/08/2018 01:21    Impression/Plan: - significant jaundice with total bilirubin of 48.9, alkaline phosphatase 764. Minimal elevation of AST at 111 and ALT at 45. INR 1.65. CT scan concerning for liver lesion as well as gallbladder lesion extending to the common bile duct. - confusion/altered mental status - Acute kidney injury. - Cirrhosis with ascites .  - anemia. No overt GI bleed. - Diarrhea. Non bloody - septic Shock  - HIV   recommendations ------------------------ - Stool studies with C. Difficile and GI pathogen panel - MRCP without contrast to rule out biliary obstruction. - AFP and CA 19-9. - Monitor CBC, CMP and INR. - GI will follow.   LOS: 0 days   Otis Brace  MD, FACP 04/08/2018,  1:56 PM  Contact #  [phone]

## 2018-04-08 NOTE — ED Notes (Signed)
Rectal temp 95.5/bair hugger applied

## 2018-04-08 NOTE — Progress Notes (Signed)
Pt arrived on the unit receiving FFP and RBC. 2LNC, arousalable by voice. Oriented to name, place and situation. Fould rotten stool noted, as well as a foul Sur[place] color urine. MRSA, URINE was sent to the lab. Mouth is full of [place].green color of thick secretions. Oral care provided. Tongue is with  black, white and green coating. Stage 2 on the sacrum noted with discoloration on Lt buttcheek. Pt is following commands.

## 2018-04-08 NOTE — ED Notes (Signed)
[facility] notified of need for transport to [facility]

## 2018-04-08 NOTE — ED Notes (Signed)
Report to [facility] per T.Catering manager

## 2018-04-08 NOTE — ED Notes (Signed)
There will be a delay on lab draws due to pt receiving blood

## 2018-04-08 NOTE — Progress Notes (Signed)
Pharmacy Antibiotic Note  [name] is a 67 y.o. female admitted on 04/07/2018 with sepsis.  Pharmacy has been consulted for zosyn dosing.  Plan: Zosyn 2.25gm iv q6hr  Weight: 135 lb (61.2 kg)  Temp (24hrs), Avg:95 F (35 C), Min:92.7 F (33.7 C), Max:97.1 F (36.2 C)  Recent Labs  Lab 04/07/18 2042 04/07/18 2121 04/07/18 2139 04/07/18 2351  WBC 22.4*  --  23.4*  --   CREATININE 12.06*  --  12.10*  --   LATICACIDVEN  --  1.22  --  0.74    Estimated Creatinine Clearance: 4.5 mL/min (A) (by C-G formula based on SCr of 12.1 mg/dL (H)).    Allergies  Allergen Reactions  . Sulfa Antibiotics Hives and Other (See Comments)    "in my shoulders; legs; feet; made me burn all over"    Antimicrobials this admission: Vancomycin 04/07/2018 x1 Zosyn 04/07/2018 >>   Dose adjustments this admission: -  Microbiology results: -  Thank you for allowing pharmacy to be a part of this patient's care.  Nani Skillern Crowford 04/08/2018 3:25 AM

## 2018-04-08 NOTE — Consult Note (Signed)
[place] Nurse wound consult note Reason for Consult:Stage 2 pressure to sacrum.  Partial thickness skin loss to right buttocks, moisture associated skin damage Wound type:pressure and moisture Pressure Injury POA: Yes Measurement: Right buttock:  2.4 cm x 0.3 cm x 0.1 cm  Wound ZTI:WPYK and moist Drainage (amount, consistency, odor) scant weeping Periwound:intact Dressing procedure/placement/frequency:Gerhardts butt paste to sacrum and buttocks twice daily.  No disposable briefs or underpads Will not follow at this time.  Please re-consult if needed.  Domenic Moras RN BSN [place] Pager [phone]

## 2018-04-08 NOTE — Procedures (Signed)
Central Venous dialysis Catheter Insertion Procedure Note [name] [id number] Oct 01, 1951  Procedure: Insertion of Central Venous Catheter Indications: dialysis   Procedure Details Consent: Risks of procedure as well as the alternatives and risks of each were explained to the (patient/caregiver).  Consent for procedure obtained. Time Out: Verified patient identification, verified procedure, site/side was marked, verified correct patient position, special equipment/implants available, medications/allergies/relevent history reviewed, required imaging and test results available.  Performed Real time [place] was used to ID and cannulate vessel  Maximum sterile technique was used including antiseptics, cap, gloves, gown, hand hygiene, mask and sheet. Skin prep: Chlorhexidine; local anesthetic administered A antimicrobial bonded/coated triple lumen catheter was placed in the right femoral vein due to multiple attempts, no other available access and patient being a dialysis patient using the Seldinger technique.  Evaluation Blood flow good Complications: No apparent complications Patient did tolerate procedure well. Chest X-ray ordered to verify placement.  CXR: normal.  Clementeen Graham 04/08/2018, 4:21 PM  Erick Colace ACNP-BC [place] Pager # [phone] OR # [phone] if no answer

## 2018-04-08 NOTE — Consult Note (Signed)
Referring Provider: No ref. provider found Primary Care Physician:  Elwyn Reach, MD Primary Nephrologist:    Reason for Consultation:  Acute renal failure   Anemia and metabolic acidosis   HPI:  PMH as below, which is significant for HIV followed by Dr. Tommy Medal treated with [facility]. 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 At her last office visit she was felt to have good control of her disease. 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 ". Family reports she has been unwell for over one month, but is usually responsive. She was last spoken to by her sister 2 days ago when she was complaining of abdominal pain and diarrhea. Family is worried that her caretakers are not competent. She has supposedly not been eating or drinking for days. ED she was found to be hypotensive despite IVF resuscitation. Levophed started. Laboratory evaluation significant for creatinine of 12.06 and BUN of 115. Patient has leukocytosis of 22.4 with left shift. AST 111, ALT 45, alkaline phosphatase 764, and total bili 48.4.  PT/INR 19.3/1.65. Initial troponin elevated at 0.39. Hemoglobin 6.1.  She was started on zosyn and IV bicarbonate We are asked to evaluate her for potential dialysis. She does not have much urine output and is not intubated but I think that the etiology is secondary to shock and ATN          Past Medical History:  Diagnosis Date  . Anxiety   . Arthritis   . Asthma   . Chronic bronchitis   . COPD (chronic obstructive pulmonary disease) ([place])   . Depression   . Fibromyalgia    "I think I got that"  . Headache(784.0)   . HIV (human immunodeficiency virus infection) ([place])    "dx'd ~ 12/2011"  . Lipoma    right forearm  . 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 Papillary carcinoma of thyroid ([place]) 03/11/12  . Pneumonia ~ 2000's   "once"  . Shortness of breath on exertion   . Thyroid  cancer s/p total thyroidectomy April2013 02/06/2012   Per patient report.  Recently diagnosed. Awaiting procedure to have thyroid removed.  Under treatment with Dr Janace Hoard .   Marland Kitchen Vaginal discharge 12/19/2015    Past Surgical History:  Procedure Laterality Date  . ANTERIOR CERVICAL DECOMP/DISCECTOMY FUSION  05/2009   C3-4; C5-6  . DILATION AND CURETTAGE OF UTERUS    . THYROIDECTOMY  03/11/12   bilaterally  . THYROIDECTOMY  03/11/2012   Procedure: THYROIDECTOMY;  Surgeon: Melissa Montane, MD;  Location: [place];  Service: ENT;  Laterality: Bilateral;    Prior to Admission medications   Medication Sig Start Date End Date Taking? Authorizing Provider  albuterol (PROVENTIL HFA;VENTOLIN HFA) 108 (90 Base) MCG/ACT inhaler Inhale 2 puffs into the lungs every 4 (four) hours as needed for wheezing or shortness of breath. 04/24/17  Yes Cardama, Grayce Sessions, MD  atazanavir-cobicistat (EVOTAZ) 300-150 MG tablet Take 1 tablet by mouth daily with breakfast. Swallow whole. Do NOT crush, cut or chew tablet. Take with food. 02/24/18  Yes Tommy Medal, Lavell Islam, MD  citalopram (CELEXA) 20 MG tablet Take 1 tablet (20 mg total) by mouth daily. Patient taking differently: Take 20-40 mg by mouth daily.  09/30/17 09/30/18 Yes Truman Hayward, MD  emtricitabine-tenofovir AF (DESCOVY) 200-25 MG tablet Take 1 tablet by mouth daily. 02/24/18  Yes Tommy Medal, Lavell Islam, MD  gabapentin (NEURONTIN) 300 MG capsule  Take 1 capsule (300 mg total) by mouth 3 (three) times daily. Patient taking differently: Take 300 mg by mouth 2 (two) times daily as needed (nerve pain).  09/30/17  Yes Tommy Medal, Lavell Islam, MD  ibuprofen (ADVIL,MOTRIN) 600 MG tablet Take 600 mg by mouth every 8 (eight) hours as needed for moderate pain.  01/04/18  Yes [provider]  naproxen sodium (ALEVE) 220 MG tablet Take 220 mg by mouth 2 (two) times daily as needed (pain).    Yes [provider]  SYNTHROID 150 MCG tablet Take 1 tablet (150 mcg total)  by mouth daily. 02/24/18  Yes Tommy Medal, Lavell Islam, MD    Current Facility-Administered Medications  Medication Dose Route Frequency Provider Last Rate Last Dose  . 0.9 %  sodium chloride infusion  10 mL/hr Intravenous Once Drenda Freeze, MD      . 0.9 %  sodium chloride infusion  250 mL Intravenous PRN Corey Harold, NP      . 0.9 %  sodium chloride infusion  10 mL/hr Intravenous Once Scatliffe, Kristen D, MD      . 0.9 %  sodium chloride infusion   Intravenous Once Jola Schmidt, MD      . chlorhexidine (PERIDEX) 0.12 % solution 15 mL  15 mL Mouth Rinse BID Rush Farmer, MD   15 mL at 04/08/18 0908  . Chlorhexidine Gluconate Cloth 2 % PADS 6 each  6 each Topical Q0600 Rush Farmer, MD   6 each at 04/08/18 0908  . famotidine (PEPCID) 10 mg in sodium chloride 0.9 % 25 mL  10 mg Intravenous QHS Corey Harold, NP   Stopped at 04/08/18 0214  . heparin injection 1,000-6,000 Units  1,000-6,000 Units CRRT PRN Edrick Oh, MD      . MEDLINE mouth rinse  15 mL Mouth Rinse q12n4p Rush Farmer, MD      . piperacillin-tazobactam (ZOSYN) IVPB 2.25 g  2.25 g Intravenous Q6H Scatliffe, Rise Paganini, MD      . prismasol BGK 4/2.5 5,000 mL dialysis replacement fluid   CRRT Continuous Edrick Oh, MD      . prismasol BGK 4/2.5 5,000 mL dialysis replacement fluid   CRRT Continuous Edrick Oh, MD      . prismasol BGK 4/2.5 5,000 mL dialysis solution   CRRT Continuous Edrick Oh, MD      . sodium bicarbonate 150 mEq in dextrose 5 % 1,000 mL infusion   Intravenous Continuous Drenda Freeze, MD 125 mL/hr at 04/08/18 1014    . sodium chloride 0.9 % primer fluid for CRRT   CRRT PRN Edrick Oh, MD        Allergies as of 04/07/2018 - Review Complete 04/07/2018  Allergen Reaction Noted  . Sulfa antibiotics Hives and Other (See Comments) 12/15/2011    Family History  Problem Relation Age of Onset  . Heart disease Maternal Uncle     Social History   Socioeconomic History  . Marital  status: Divorced    Spouse name: Not on file  . Number of children: Not on file  . Years of education: 68  . Highest education level: Not on file  Occupational History  . Not on file  Social Needs  . Financial resource strain: Not on file  . Food insecurity:    Worry: Not on file    Inability: Not on file  . Transportation needs:    Medical: Not on file    Non-medical: Not  on file  Tobacco Use  . Smoking status: Current Every Day Smoker    Packs/day: 0.50    Years: 47.00    Pack years: 23.50    Types: Cigarettes  . Smokeless tobacco: Former Systems developer    Quit date: 12/01/1974  . Tobacco comment: currently smokes   Substance and Sexual Activity  . Alcohol use: No    Comment: 03/11/12 "no beer or wine ~ 01/2012"  . Drug use: No    Frequency: 1.0 times per week    Types: Marijuana, Cocaine    Comment: 03/11/12 "last drug use early /2013"  . Sexual activity: Not Currently    Partners: Male    Birth control/protection: None, Post-menopausal  Lifestyle  . Physical activity:    Days per week: Not on file    Minutes per session: Not on file  . Stress: Not on file  Relationships  . Social connections:    Talks on phone: Not on file    Gets together: Not on file    Attends religious service: Not on file    Active member of club or organization: Not on file    Attends meetings of clubs or organizations: Not on file    Relationship status: Not on file  . Intimate partner violence:    Fear of current or ex partner: Not on file    Emotionally abused: Not on file    Physically abused: Not on file    Forced sexual activity: Not on file  Other Topics Concern  . Not on file  Social History Narrative   Regular exercise-yes (walking)   Caffeine Use-yes          Review of Systems: Patient not able to provide a review of systems  Physical Exam: Vital signs in last 24 hours: Temp:  [92.7 F (33.7 C)-97.1 F (36.2 C)] 96.1 F (35.6 C) (05/09 0715) Pulse Rate:  [56-67] 64 (05/09  0900) Resp:  [9-31] 20 (05/09 0900) BP: (63-123)/(44-86) 123/82 (05/09 0900) SpO2:  [93 %-100 %] 99 % (05/09 0900) Weight:  [135 lb (61.2 kg)] 135 lb (61.2 kg) (05/08 2155)   General:   Ill appearing  Head:  Normocephalic and atraumatic. Eyes:  Sclera clear, no icterus.   Conjunctiva pink. Ears:  Normal auditory acuity. Nose:  No deformity, discharge,  or lesions. Mouth:  No deformity or lesions, dentition normal. Neck:  Supple; no masses or thyromegaly. JVP not elevated Lungs:  Clear throughout to auscultation.   No wheezes, crackles, or rhonchi. No acute distress. Heart:  Regular rate and rhythm; no murmurs, clicks, rubs,  or gallops. Abdomen:  Soft, nontender and nondistended. No masses, hepatosplenomegaly or hernias noted. Normal bowel sounds, without guarding, and without rebound.   Msk:  Symmetrical without gross deformities. Normal posture. Pulses:  No carotid, renal, femoral bruits. DP and PT symmetrical and equal Extremities:  Without clubbing or edema. Neurologic:  Alert and  oriented x4;  grossly normal neurologically. Skin:  Intact without significant lesions or rashes.   Intake/Output from previous day: 05/08 0701 - 05/09 0700 In: 9788 [I.V.:5250; ZJQBH:4193; IV Piggyback:3050] Out: 5 [Urine:5] Intake/Output this shift: Total I/O In: 530 [Blood:530] Out: -   Lab Results: Recent Labs    04/07/18 2042 04/07/18 2139  WBC 22.4* 23.4*  HGB 6.3* 6.1*  HCT 18.9* 18.3*  PLT 355 323   BMET Recent Labs    04/07/18 2042 04/07/18 2139  NA 136 136  K 3.6 3.6  CL 100* 102  CO2  13* 13*  GLUCOSE 88 89  BUN 115* 113*  CREATININE 12.06* 12.10*  CALCIUM 7.9* 7.7*   LFT Recent Labs    04/07/18 2139  PROT 5.9*  ALBUMIN 1.8*  AST 111*  ALT 45  ALKPHOS 764*  BILITOT 48.9*   PT/INR Recent Labs    04/07/18 2139  LABPROT 19.3*  INR 1.65   Hepatitis Panel No results for input(s): HEPBSAG, HCVAB, HEPAIGM, HEPBIGM in the last 72  hours.  Studies/Results: Ct Abdomen Pelvis Wo Contrast  Result Date: 04/08/2018 CLINICAL DATA:  Failure to thrive the past 4 days. Jaundice and incontinence. Leukocytosis. EXAM: CT ABDOMEN AND PELVIS WITHOUT CONTRAST TECHNIQUE: Multidetector CT imaging of the abdomen and pelvis was performed following the standard protocol without IV contrast. COMPARISON:  05/09/2012 CT FINDINGS: Lower chest: Tip of central line catheter is seen in the proximal right atrium. Mild cardiomegaly without pericardial effusion. Bilateral small pleural effusions with atelectasis at each lung base. 6 mm nodular density in the right lower lobe adjacent to the major fissure, series 2/2. Hepatobiliary: Surface nodularity morphologically consistent with cirrhosis. Left hepatic lobe hypodense mass measuring 4.8 x 4.7 x 3.7 cm is noted, new since prior exam and concerning for hepatocellular carcinoma or potentially metastatic disease. The gallbladder is distended and contains intraluminal hyperdense noncalcified abnormalities. This appears to extend into the common duct and may be contributing to the patient's jaundice. Hyperdense biliary sludge, blood products or intraluminal gallbladder mass might account for this appearance. Pancreas: The pancreas appears atrophic without ductal dilatation. Spleen: No splenomegaly. Adrenals/Urinary Tract: No adrenal mass. There is left adrenal limb thickening. Kidneys are visualized without nephrolithiasis nor obstructive uropathy. No discrete renal mass is identified. The urinary bladder is decompressed and somewhat thickened in appearance as a result. Stomach/Bowel: Physiologic distention of the stomach. No small bowel dilatation. No bowel obstruction. Sympathetic thickening of the intestine likely from surrounding ascites and edema. Circumferential rectal wall thickening possibly representing internal hemorrhoids. Vascular/Lymphatic: Aortoiliac atherosclerosis without aneurysm. Lymphadenopathy is  difficult to assess given paucity intra-abdominal fat and lack of IV contrast. Reproductive: Calcified uterine fibroids are noted. No adnexal mass is seen. Other: Moderate volume of ascites within the abdomen pelvis. Mild soft tissue anasarca. Musculoskeletal: No aggressive osseous lesions. Degenerative disc disease L5-S1. IMPRESSION: 1. Cardiomegaly with small bilateral pleural effusions. 6 mm nodular density in the right lower lobe adjacent to the major fissure. Non-contrast chest CT at 6-12 months is recommended. If the nodule is stable at time of repeat CT, then future CT at 18-24 months (from today's scan) is considered optional for low-risk patients, but is recommended for high-risk patients. This recommendation follows the consensus statement: Guidelines for Management of Incidental Pulmonary Nodules Detected on CT Images: From the Fleischner Society 2017; Radiology 2017; 284:228-243. 2. Morphologic appearance of cirrhosis with moderate ascites. A hypodense mass in the left hepatic lobe measuring 4.8 x 4.7 x 3.7 cm is new since prior comparison and concerning for neoplasm. 3. Gallbladder distention with hyperdense intraluminal masslike abnormalities noted within either representing tumefactive biliary sludge blood products or potentially intraluminal mass. The hyperdensity extends into what appears to be the common duct may be contributing to the patient's reported jaundice. 4. Nonobstructed, nondistended bowel. Circumferential thickening in the rectum may represent internal hemorrhoids. 5. Calcified uterine fibroids. 6. Moderate-to-marked aortoiliac atherosclerosis. Electronically Signed   By: Ashley Royalty M.D.   On: 04/08/2018 01:39   Dg Chest 1 View  Result Date: 04/07/2018 CLINICAL DATA:  Weakness.  HIV. EXAM: CHEST  1 VIEW COMPARISON:  01/26/2018. FINDINGS: AP portable supine radiograph demonstrates elevated RIGHT hemidiaphragm. Perihilar opacity is new/increased from priors, extending into the  RIGHT-sided retrocardiac region, suspicious for RIGHT lower lobe pneumonia. Streaky LEFT basilar density probably atelectasis. No osseous findings of significance. Previous cervical fusion. Normal heart size. IMPRESSION: Worsening aeration from priors. Query RIGHT perihilar pneumonia with volume loss. Electronically Signed   By: Staci Righter M.D.   On: 04/07/2018 22:49   Ct Head Wo Contrast  Result Date: 04/08/2018 CLINICAL DATA:  Altered level of consciousness EXAM: CT HEAD WITHOUT CONTRAST TECHNIQUE: Contiguous axial images were obtained from the base of the skull through the vertex without intravenous contrast. COMPARISON:  04/11/2014 FINDINGS: Brain: Mild age related involutional changes of the brain. Minimal small vessel ischemic disease of periventricular white matter. No large vascular territory infarct hemorrhage or midline shift. No intra-axial mass nor extra-axial fluid collections. Vascular: No hyperdense vessel sign. Skull: No skull fracture. Sinuses/Orbits: Clear paranasal sinuses and mastoids. Intact orbits and globes. Other: None IMPRESSION: Chronic minimal small vessel ischemic disease of periventricular white matter. No acute intracranial abnormality. Electronically Signed   By: Ashley Royalty M.D.   On: 04/08/2018 01:22   Dg Chest Port 1 View  Result Date: 04/08/2018 CLINICAL DATA:  Central line placement EXAM: PORTABLE CHEST 1 VIEW COMPARISON:  04/07/2018 FINDINGS: Right IJ approach central line catheter is seen in the proximal right atrium. No pneumothorax. Heart size is normal. There is mild ectasia of the thoracic aorta. Slight improvement in aeration about the perihilar airspace opacity on the right. Bibasilar atelectasis is noted, left greater than right. ACDF of the included mid and lower cervical spine. IMPRESSION: New right IJ central line catheter is seen in the proximal right atrium without pneumothorax. Slightly improved aeration with respect infrahilar pulmonary consolidation noted  previously. Electronically Signed   By: Ashley Royalty M.D.   On: 04/08/2018 01:21    Assessment/Plan:  Acute renal failure with Creatinine that has increased to above 12  The etiology appears to be from hemorrhagic shock and acute tubular necrosis. I think that starting CRRT may be a good idea in this lady at this point although we should watch for recovery. I think it reasonable to order a renal ultrasound to rule out obstruction and to send a urinalysis and urine microscopy study to rule out acute interstitial nephritis and acute glomerulonephritis. 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

## 2018-04-08 NOTE — H&P (Addendum)
PULMONARY / CRITICAL CARE MEDICINE   Name: [name] MRN: [id number] DOB: 05-Jun-1951    ADMISSION DATE:  04/07/2018 CONSULTATION DATE:  5/9  REFERRING MD:  Dr. Valere Dross EDP  CHIEF COMPLAINT:  Shock  HISTORY OF PRESENT ILLNESS:   67 year old female with PMH as below, which is significant for HIV followed by Dr. Tommy Medal treated with Symtuza. 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 At her last office visit she was felt to have good control of her disease. 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   Patient is encephalopathic and/or intubated. Therefore history has been obtained from chart review.  She presented to ED 5/8 for "failure to thrive". Family reports she has been unwell for over one month, but is usually responsive. She was last spoken to by her sister 2 days ago when she was complaining of abdominal pain and diarrhea. Family is worried that her caretakers are not competent. She has supposedly not been eating or drinking for days.   Upon arrival to the ED she was found to be hypotensive despite IVF resuscitation. Levophed started. Laboratory evaluation significant for creatinine of 12.06 and BUN of 115.  Patient has leukocytosis of 22.4 with left shift.  AST 111, ALT 45, alkaline phosphatase 764, and total bili 48.4.    PT/INR 19.3/1.65. Initial troponin elevated at 0.39. Hemoglobin 6.1. She was transfused. CT head wnl, CT abdomen done as well. Pending. She was started on ABX for presumed sepsis. PCCM asked to admit.   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) (03/11/12), Pneumonia (~ 2000's),  Shortness of breath on exertion, Thyroid cancer s/p total thyroidectomy April2013 (02/06/2012), and Vaginal discharge (12/19/2015).  PAST SURGICAL HISTORY: She  has a past surgical history that includes Thyroidectomy (03/11/12); Anterior cervical decomp/discectomy fusion (05/2009); Dilation and curettage of uterus; and Thyroidectomy (03/11/2012).  Allergies  Allergen Reactions  . Sulfa Antibiotics Hives and Other (See Comments)    "in my shoulders; legs; feet; made me burn all over"    No current facility-administered medications on file prior to encounter.    Current Outpatient Medications on File Prior to Encounter  Medication Sig  . albuterol (PROVENTIL HFA;VENTOLIN HFA) 108 (90 Base) MCG/ACT inhaler Inhale 2 puffs into the lungs every 4 (four) hours as needed for wheezing or shortness of breath.  Marland Kitchen atazanavir-cobicistat (EVOTAZ) 300-150 MG tablet Take 1 tablet by mouth daily with breakfast. Swallow whole. Do NOT crush, cut or chew tablet. Take with food.  . citalopram (CELEXA) 20 MG tablet Take 1 tablet (20 mg total) by mouth daily. (Patient taking differently: Take 20-40 mg by mouth daily. )  . emtricitabine-tenofovir AF (DESCOVY) 200-25 MG tablet Take 1 tablet by mouth daily.  Marland Kitchen gabapentin (NEURONTIN) 300 MG capsule Take 1 capsule (300 mg total) by mouth 3 (three) times daily. (Patient taking differently: Take 300 mg by mouth 2 (two) times daily as needed (nerve pain). )  . ibuprofen (ADVIL,MOTRIN) 600 MG tablet Take 600 mg by mouth every 8 (eight) hours as needed for moderate pain.   . naproxen sodium (ALEVE) 220 MG tablet Take 220 mg by mouth 2 (two) times daily as needed (pain).   . SYNTHROID 150 MCG tablet  Take 1 tablet (150 mcg total) by mouth daily.    FAMILY HISTORY:  Her indicated that her mother is deceased. She indicated that her father is deceased. She indicated that the status of her maternal uncle is unknown.   SOCIAL HISTORY: She  reports that she has been smoking  cigarettes.  She has a 23.50 pack-year smoking history. She quit smokeless tobacco use about 43 years ago. She reports that she does not drink alcohol or use drugs.  REVIEW OF SYSTEMS:  ( unable to obtain due to acute metabolic encephalopathy) Constitutional: Negative for fever, chills, weight loss, malaise/fatigue and diaphoresis.  HENT: Negative for hearing loss, ear pain, nosebleeds, congestion, sore throat, neck pain, tinnitus and ear discharge.  Eyes: Negative for blurred vision, double vision, photophobia, pain, discharge and redness.  Respiratory: Negative for cough, hemoptysis, sputum production, shortness of breath, wheezing and stridor.  Cardiovascular: Negative for chest pain, palpitations, orthopnea, claudication, leg swelling and PND.  Gastrointestinal: Negative for heartburn, nausea, vomiting, abdominal pain, diarrhea, constipation, blood in stool and melena.  Genitourinary: Negative for dysuria, urgency, frequency, hematuria and flank pain.  Musculoskeletal: Negative for myalgias, back pain, joint pain and falls.  Skin: Negative for itching and rash.  Neurological: Negative for dizziness, tingling, tremors, sensory change, speech change, focal weakness, seizures, loss of consciousness, weakness and headaches.  Endo/Heme/Allergies: Negative for environmental allergies and polydipsia. Does not bruise/bleed easily.    SUBJECTIVE:    VITAL SIGNS: BP 100/69 Comment: temp foley just inserted  Pulse 66 Comment: temp foley just inserted  Temp (!) 92.7 F (33.7 C) Comment: temp foley just inserted  Resp (!) 9 Comment: temp foley just inserted  Wt 61.2 kg (135 lb)   SpO2 98% Comment: temp foley just inserted  BMI 20.53 kg/m   HEMODYNAMICS:    VENTILATOR SETTINGS:    INTAKE / OUTPUT: No intake/output data recorded.  PHYSICAL EXAMINATION: General:  Thin female, somnolent Neuro:  Somnolent, responds to name, verbal  HEENT:  NCAT, dry lips, EOM intact Cardiovascular:   S1 and  S2 +2/6 murmur Lungs:  Mild crackles b/l Abdomen:  Soft, not distended hypoactive BS Musculoskeletal:  No lower ext edema Skin:  jaundiced  LABS:  BMET Recent Labs  Lab 04/07/18 2042 04/07/18 2139  NA 136 136  K 3.6 3.6  CL 100* 102  CO2 13* 13*  BUN 115* 113*  CREATININE 12.06* 12.10*  GLUCOSE 88 89    Electrolytes Recent Labs  Lab 04/07/18 2042 04/07/18 2139  CALCIUM 7.9* 7.7*    CBC Recent Labs  Lab 04/07/18 2042 04/07/18 2139  WBC 22.4* 23.4*  HGB 6.3* 6.1*  HCT 18.9* 18.3*  PLT 355 323    Coag's Recent Labs  Lab 04/07/18 2139  INR 1.65    Sepsis Markers Recent Labs  Lab 04/07/18 2121 04/07/18 2351  LATICACIDVEN 1.22 0.74    ABG No results for input(s): PHART, PCO2ART, PO2ART in the last 168 hours.  Liver Enzymes Recent Labs  Lab 04/07/18 2139  AST 111*  ALT 45  ALKPHOS 764*  BILITOT 48.9*  ALBUMIN 1.8*    Cardiac Enzymes Recent Labs  Lab 04/07/18 2218  TROPONINI 0.39*    Glucose Recent Labs  Lab 04/07/18 2120  GLUCAP 86    Imaging Dg Chest 1 View  Result Date: 04/07/2018 CLINICAL DATA:  Weakness.  HIV. EXAM: CHEST  1 VIEW COMPARISON:  01/26/2018. FINDINGS: AP portable supine radiograph demonstrates elevated RIGHT hemidiaphragm. Perihilar opacity is new/increased from priors, extending into  the RIGHT-sided retrocardiac region, suspicious for RIGHT lower lobe pneumonia. Streaky LEFT basilar density probably atelectasis. No osseous findings of significance. Previous cervical fusion. Normal heart size. IMPRESSION: Worsening aeration from priors. Query RIGHT perihilar pneumonia with volume loss. Electronically Signed   By: Staci Righter M.D.   On: 04/07/2018 22:49     STUDIES:  CT abdomen/pelvis 5/8 > Cardiomegaly with small bilateral pleural effusions. 6 mm nodular density in the right lower lobe adjacent to the major fissure. Morphologic appearance of cirrhosis with moderate ascites. A hypodense mass in the left  hepatic lobe measuring 4.8 x 4.7 x 3.7 cm is new since prior comparison and concerning for neoplasm. Gallbladder distention with hyperdense intraluminal masslike abnormalities noted within either representing tumefactive biliary sludge blood products or potentially intraluminal mass. CT head 5/8 >Chronic minimal small vessel ischemic disease of periventricular white matter. No acute intracranial abnormality.  CULTURES: Blood 5/8 > Urine 5/8 >  ANTIBIOTICS: Zosyn 5/8 > Vancomycin 5/8    LINES/TUBES: Indwelling foley catheter- no UOP PIVs  DISCUSSION: 67 yr old HIV+ female with 4 day history of not drinking and eating well. Severely dehydrated with Acute Kidney injury( severe). AGMA secondary to uremia. Septic shock.  ASSESSMENT / PLAN:  PULMONARY A: Pulmonary nodule  P:   Consider CT chest if need to identify biopsy source. Otherwise follow up CT as outpatient.   CARDIOVASCULAR A:  Shock Septic +/- hypovolemic NSTEMI  P:  Telemetry monitoring Levophed for MAP goal 64mmHg IVF resuscitation with LR Echo CVP monitoring Trend troponin  RENAL A:   Acute renal failure Metabolic acidosis Rhabdomyolysis   P:   Place foley Renal Ultrasound IVF resuscitation- received 5L Ns so far Sodium Bicarb infusion @125  Unable to contact nephrology, will need consult in AM Trend CK- cont IVF  GASTROINTESTINAL A:   Hepatic mass on Admit CT Masslike appearance in gallbladder and CBD  P:   NPO Pepcid low dose given renal failure May need biopsy if able to make some improvement.  HEMATOLOGIC A:   Anemia  P:  Transfuse additional 1 unit PRBC (2 total), add 1 unit FFP. Repeat CBC, suspect hgb will worsen as we resuscitate her.   INFECTIOUS A:   Septic shock Source not entirely clear. PNA vs intraabdominal process. Immunocompromised host.  HIV  P:   Conitnue Zosyn Hold vancomycin with renal failure. Got one dose in ED. Check Vanc random level Follow  cultures PCT Consider ID consultation  ENDOCRINE A:   S/p thyroidectomy   P:   Check TSH  NEUROLOGIC A:   Acute metabolic encephalopathy multifactorial. Sepsis, uremia, acidosis.  P:   RASS goal: 0 Supportive Care, protecting airway.    FAMILY  - Updates: - none present at bedside at time of evaluation. She has a sister who helped to provide information for HPI.   - Inter-disciplinary family meet or Palliative Care meeting due by:  day 7   Signed Dr Seward Carol Pulmonary Critical Care Locums

## 2018-04-08 NOTE — ED Notes (Signed)
Critical Care at bedside and ordered bair hugger to be discontinued

## 2018-04-09 ENCOUNTER — Inpatient Hospital Stay (HOSPITAL_COMMUNITY): Payer: Medicare Other

## 2018-04-09 ENCOUNTER — Encounter (HOSPITAL_COMMUNITY): Payer: Self-pay | Admitting: Radiology

## 2018-04-09 DIAGNOSIS — B379 Candidiasis, unspecified: Secondary | ICD-10-CM

## 2018-04-09 DIAGNOSIS — F17210 Nicotine dependence, cigarettes, uncomplicated: Secondary | ICD-10-CM

## 2018-04-09 DIAGNOSIS — R188 Other ascites: Secondary | ICD-10-CM

## 2018-04-09 DIAGNOSIS — R7989 Other specified abnormal findings of blood chemistry: Secondary | ICD-10-CM

## 2018-04-09 DIAGNOSIS — K869 Disease of pancreas, unspecified: Secondary | ICD-10-CM

## 2018-04-09 DIAGNOSIS — R160 Hepatomegaly, not elsewhere classified: Secondary | ICD-10-CM

## 2018-04-09 DIAGNOSIS — B20 Human immunodeficiency virus [HIV] disease: Secondary | ICD-10-CM

## 2018-04-09 LAB — PREPARE FRESH FROZEN PLASMA
UNIT DIVISION: 0
Unit division: 0

## 2018-04-09 LAB — GASTROINTESTINAL PANEL BY PCR, STOOL (REPLACES STOOL CULTURE)

## 2018-04-09 LAB — BPAM FFP
BLOOD PRODUCT EXPIRATION DATE: 201905142359
Blood Product Expiration Date: 201905142359
ISSUE DATE / TIME: 201905090605
ISSUE DATE / TIME: 201905090520
Unit Type and Rh: 5100
Unit Type and Rh: 5100

## 2018-04-09 LAB — RENAL FUNCTION PANEL
ALBUMIN: 1.8 g/dL — AB (ref 3.5–5.0)
ANION GAP: 18 — AB (ref 5–15)
Albumin: 1.5 g/dL — ABNORMAL LOW (ref 3.5–5.0)
Anion gap: 10 (ref 5–15)
BUN: 48 mg/dL — ABNORMAL HIGH (ref 6–20)
BUN: 29 mg/dL — ABNORMAL HIGH (ref 6–20)
CALCIUM: 7.2 mg/dL — AB (ref 8.9–10.3)
CHLORIDE: 97 mmol/L — AB (ref 101–111)
CO2: 22 mmol/L (ref 22–32)
CO2: 29 mmol/L (ref 22–32)
CREATININE: 4.91 mg/dL — AB (ref 0.44–1.00)
CREATININE: 3.2 mg/dL — AB (ref 0.44–1.00)
Calcium: 7 mg/dL — ABNORMAL LOW (ref 8.9–10.3)
Chloride: 98 mmol/L — ABNORMAL LOW (ref 101–111)
GFR calc non Af Amer: 8 mL/min — ABNORMAL LOW (ref >60)
GFR, EST AFRICAN AMERICAN: 10 mL/min — AB (ref >60)
GFR, EST AFRICAN AMERICAN: 16 mL/min — AB (ref >60)
GFR, EST NON AFRICAN AMERICAN: 14 mL/min — AB (ref >60)
Glucose, Bld: 108 mg/dL — ABNORMAL HIGH (ref 65–99)
Glucose, Bld: 121 mg/dL — ABNORMAL HIGH (ref 65–99)
PHOSPHORUS: 3.2 mg/dL (ref 2.5–4.6)
Phosphorus: 2.1 mg/dL — ABNORMAL LOW (ref 2.5–4.6)
Potassium: 3 mmol/L — ABNORMAL LOW (ref 3.5–5.1)
Potassium: 4.1 mmol/L (ref 3.5–5.1)
SODIUM: 138 mmol/L (ref 135–145)
Sodium: 136 mmol/L (ref 135–145)

## 2018-04-09 LAB — BODY FLUID CELL COUNT WITH DIFFERENTIAL
EOS FL: 0 %
Lymphs, Fluid: 20 %
Monocyte-Macrophage-Serous Fluid: 39 % — ABNORMAL LOW (ref 50–90)
Neutrophil Count, Fluid: 41 % — ABNORMAL HIGH (ref 0–25)
Total Nucleated Cell Count, Fluid: 295 cu mm (ref 0–1000)

## 2018-04-09 LAB — HEPATIC FUNCTION PANEL
ALK PHOS: 675 U/L — AB (ref 38–126)
ALT: 38 U/L (ref 14–54)
AST: 104 U/L — ABNORMAL HIGH (ref 15–41)
Albumin: 1.8 g/dL — ABNORMAL LOW (ref 3.5–5.0)
BILIRUBIN TOTAL: 49.9 mg/dL — AB (ref 0.3–1.2)
TOTAL PROTEIN: 5.7 g/dL — AB (ref 6.5–8.1)

## 2018-04-09 LAB — GLUCOSE, CAPILLARY
GLUCOSE-CAPILLARY: 89 mg/dL (ref 65–99)
GLUCOSE-CAPILLARY: 97 mg/dL (ref 65–99)
GLUCOSE-CAPILLARY: 99 mg/dL (ref 65–99)
Glucose-Capillary: 92 mg/dL (ref 65–99)
Glucose-Capillary: 97 mg/dL (ref 65–99)
Glucose-Capillary: 104 mg/dL — ABNORMAL HIGH (ref 65–99)

## 2018-04-09 LAB — PROCALCITONIN: PROCALCITONIN: 4.96 ng/mL

## 2018-04-09 LAB — AFP TUMOR MARKER: AFP, Serum, Tumor Marker: 1.6 ng/mL (ref 0.0–8.3)

## 2018-04-09 LAB — ALBUMIN, PLEURAL OR PERITONEAL FLUID: Albumin, Fluid: <1 g/dL

## 2018-04-09 LAB — URINE CULTURE: Culture: NO GROWTH

## 2018-04-09 LAB — CBC
HEMATOCRIT: 28.1 % — AB (ref 36.0–46.0)
Hemoglobin: 9.9 g/dL — ABNORMAL LOW (ref 12.0–15.0)
MCH: 31.4 pg (ref 26.0–34.0)
MCHC: 35.2 g/dL (ref 30.0–36.0)
MCV: 89.2 fL (ref 78.0–100.0)
PLATELETS: 166 10*3/uL (ref 150–400)
RBC: 3.15 MIL/uL — ABNORMAL LOW (ref 3.87–5.11)
RDW: 17.9 % — ABNORMAL HIGH (ref 11.5–15.5)
WBC: 21.9 10*3/uL — AB (ref 4.0–10.5)

## 2018-04-09 LAB — CK: CK TOTAL: 618 U/L — AB (ref 38–234)

## 2018-04-09 LAB — AMMONIA: Ammonia: 62 umol/L — ABNORMAL HIGH (ref 9–35)

## 2018-04-09 LAB — TSH: TSH: 15.913 u[IU]/mL — AB (ref 0.350–4.500)

## 2018-04-09 LAB — GLUCOSE, PLEURAL OR PERITONEAL FLUID: Glucose, Fluid: 110 mg/dL

## 2018-04-09 LAB — LACTATE DEHYDROGENASE, PLEURAL OR PERITONEAL FLUID: LD FL: 141 U/L — AB (ref 3–23)

## 2018-04-09 LAB — ABO/RH: ABO/RH(D): O POS

## 2018-04-09 LAB — PROTEIN, PLEURAL OR PERITONEAL FLUID

## 2018-04-09 LAB — MAGNESIUM: Magnesium: 2 mg/dL (ref 1.7–2.4)

## 2018-04-09 LAB — LEGIONELLA PNEUMOPHILA SEROGP 1 UR AG: L. PNEUMOPHILA SEROGP 1 UR AG: NEGATIVE

## 2018-04-09 LAB — TROPONIN I: TROPONIN I: 0.25 ng/mL — AB (ref <0.03)

## 2018-04-09 LAB — PROTIME-INR
INR: 1.62
PROTHROMBIN TIME: 19.1 s — AB (ref 11.4–15.2)

## 2018-04-09 MED ORDER — PNEUMOCOCCAL VAC POLYVALENT 25 MCG/0.5ML IJ INJ
0.5000 mL | INJECTION | INTRAMUSCULAR | Status: DC
  Filled 2018-04-09: qty 0.5

## 2018-04-09 MED ORDER — POTASSIUM CHLORIDE 10 MEQ/100ML IV SOLN: 10.0000 meq | INTRAVENOUS | Status: DC

## 2018-04-09 MED ORDER — CHLORHEXIDINE GLUCONATE CLOTH 2 % EX PADS
6.0000 | MEDICATED_PAD | Freq: Every day | CUTANEOUS | Status: DC
  Administered 2018-04-10 – 2018-04-11 (×2): 6 via TOPICAL

## 2018-04-09 MED ORDER — SODIUM CHLORIDE 0.9 % IV SOLN
3.0000 g | Freq: Three times a day (TID) | INTRAVENOUS | Status: DC
  Administered 2018-04-09 – 2018-04-10 (×2): 3 g via INTRAVENOUS
  Filled 2018-04-09 (×4): qty 3

## 2018-04-09 MED ORDER — POTASSIUM CHLORIDE 10 MEQ/50ML IV SOLN
10.0000 meq | INTRAVENOUS | Status: AC
  Administered 2018-04-09 (×6): 10 meq via INTRAVENOUS
  Filled 2018-04-09 (×6): qty 50

## 2018-04-09 MED ORDER — RIFAXIMIN 550 MG PO TABS
550.0000 mg | ORAL_TABLET | Freq: Two times a day (BID) | ORAL | Status: DC
  Filled 2018-04-09 (×5): qty 1

## 2018-04-09 NOTE — Progress Notes (Signed)
Pharmacy Antibiotic Note  [name] is a 67 y.o. female admitted on 04/07/2018 with shock.  Patient has liver and pancreatic masses on MRI.  Pharmacy has been consulted to narrow Zosyn to Unasyn for intra-abdominal infection.  She has AKI currently on CRRT.  Afebrile, WBC 21.9, PCT 4.96.   Plan: Unasyn 3gm IV Q8H Diflucan 400mg  IV Q24H per MD Rifaximin 550mg  PO BID per MD Monitor CRRT tolerance/interruption, clinical progress   Height: 5\' 8"  (172.7 cm) Weight: 133 lb 13.1 oz (60.7 kg) IBW/kg (Calculated) : 63.9  Temp (24hrs), Avg:95.9 F (35.5 C), Min:94.1 F (34.5 C), Max:97.9 F (36.6 C)  Recent Labs  Lab 04/07/18 2042 04/07/18 2121 04/07/18 2139 04/07/18 2351 04/08/18 1422 04/09/18 0515  WBC 22.4*  --  23.4*  --   --  21.9*  CREATININE 12.06*  --  12.10*  --  10.15* 4.91*  LATICACIDVEN  --  1.22  --  0.74  --   --     Estimated Creatinine Clearance: 10.9 mL/min (A) (by C-G formula based on SCr of 4.91 mg/dL (H)).    Allergies  Allergen Reactions  . Sulfa Antibiotics Hives and Other (See Comments)    "in my shoulders; legs; feet; made me burn all over"    Vanc 5/8 x1 Zosyn 04/07/2018 >> 5/10 Diflucan 5/9 >> Unasyn 5/10 >>  5/8 blood cx: ngtd 5/9 mrsa pcr: neg 5/9 cdiff: NEG 5/8 Urine - NEG   [name] D. Mina Marble, PharmD, BCPS, [place] Pager:  [phone] 04/09/2018, 3:23 PM

## 2018-04-09 NOTE — Progress Notes (Signed)
Pt bleeding from paracentesis site, has 3" hematoma. Notified NP and MD at bedside, advised to dress with gauze and continue to monitor for further signs of bleeding. Dressed with 4x4 and ABD. Will continue to monitor.

## 2018-04-09 NOTE — Progress Notes (Addendum)
PULMONARY / CRITICAL CARE MEDICINE   Name: [name] MRN:   [id number] DOB:   July 18, 1951           ADMISSION DATE:  04/07/2018 CONSULTATION DATE:  5/9  REFERRING MD:  Dr. Valere Dross EDP  CHIEF COMPLAINT:  Shock  HISTORY OF PRESENT ILLNESS:   67 year old female with PMH as below, which is significant for HIV followed by Dr. Tommy Medal treated with Symtuza. 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 At her last office visit she was felt to have good control of her disease, but this is back in 2017, her current CD 4 count is 200 w/ exam c/w AIDS at this point. 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   Admitted 5/8 She presented to ED 5/8 for "failure to thrive". Family reports she has been unwell for over one month, but is usually responsive. She was last spoken to by her sister 2 days prior when she was complaining of abdominal pain and diarrhea. Family is worried that her caretakers are not competent. She had supposedly not been eating or drinking for days.   Upon arrival to the ED she was found to be hypotensive despite IVF resuscitation. Levophed started. Laboratory evaluation significant for creatinine of 12.06 and BUN of 115. Patient has leukocytosis of 22.4 with left shift. AST 111, ALT 45, alkaline phosphatase 764, and total bili 48.4.  PT/INR 19.3/1.65. Initial troponin elevated at 0.39. Hemoglobin 6.1. She was transfused. CT head wnl, CT abdomen done as well. Pending. She was started on ABX for presumed sepsis. PCCM asked to admit.   Subjective/interval:  CRRT initiated last night Remains hemodynamically stable Culture data is negative to date Renal function improving on CRRT  Objective Blood Pressure (Abnormal) 86/67   Pulse (Abnormal) 56   Temperature (Abnormal) 94.3 F (34.6 C) (Bladder)   Respiration 11   Weight 133 lb 13.1 oz (60.7 kg)   Oxygen Saturation  91%   Body Mass Index 20.35 kg/m   CVP:  [6 mmHg-10 mmHg] 7 mmHg   Intake/Output Summary (Last 24 hours) at 04/09/2018 1115 Last data filed at 04/09/2018 1000 Gross per 24 hour  Intake 3745 ml  Output 2702 ml  Net 1043 ml    Physical exam General: 67 year old female currently awake, withdrawn, oriented x2 HEENT poor dentition.  Tongue is caked with thick white-Makki appearing coating no JVD Pulmonary: Clear to auscultation Cardiac: Systolic diastolic heart murmur proximally 4 out of 6 Abdomen: Remains distended and painful to palp hypoactive bowel sounds having frequent bowel movements GU cloudy Schillo-colored urine Neuro: Oriented x1-2.  Moves all extremities, affect flat and withdrawn  CBC Recent Labs    04/07/18 2042 04/07/18 2139 04/08/18 2217 04/09/18 0515  WBC 22.4* 23.4*  --  21.9*  HGB 6.3* 6.1* 9.9* 9.9*  HCT 18.9* 18.3* 28.1* 28.1*  PLT 355 323  --  166    Coag's Recent Labs    04/07/18 2139 04/09/18 0515  INR 1.65 1.62    BMET Recent Labs    04/07/18 2139 04/08/18 1422 04/09/18 0515  NA 136 137 138  K 3.6 2.6* 3.0*  CL 102 101 98*  CO2 13* 18* 22  BUN 113* 99* 48*  CREATININE 12.10* 10.15* 4.91*  GLUCOSE 89 125* 108*    Electrolytes Recent Labs    04/07/18 2139 04/08/18 1422 04/09/18 0515  CALCIUM 7.7* 6.4* 7.2*  MG  --   --  2.0  PHOS  --  7.6* 3.2    Sepsis Markers Recent Labs    04/09/18 0515  PROCALCITON 4.96    ABG No results for input(s): PHART, PCO2ART, PO2ART in the last 72 hours.  Liver Enzymes Recent Labs    04/07/18 2139 04/08/18 1422 04/09/18 0515  AST 111*  --  104*  ALT 45  --  38  ALKPHOS 764*  --  675*  BILITOT 48.9*  --  49.9*  ALBUMIN 1.8* 1.7* 1.8*  1.8*    Cardiac Enzymes Recent Labs    04/07/18 2218 04/09/18 0515  TROPONINI 0.39* 0.25*    Glucose Recent Labs    04/08/18 1016 04/08/18 1211 04/08/18 2004 04/09/18 0011 04/09/18 0436 04/09/18 0744  GLUCAP 155* 127* 101* 99 92 97     Imaging Ct Abdomen Pelvis Wo Contrast  Result Date: 04/08/2018 CLINICAL DATA:  Failure to thrive the past 4 days. Jaundice and incontinence. Leukocytosis. EXAM: CT ABDOMEN AND PELVIS WITHOUT CONTRAST TECHNIQUE: Multidetector CT imaging of the abdomen and pelvis was performed following the standard protocol without IV contrast. COMPARISON:  05/09/2012 CT FINDINGS: Lower chest: Tip of central line catheter is seen in the proximal right atrium. Mild cardiomegaly without pericardial effusion. Bilateral small pleural effusions with atelectasis at each lung base. 6 mm nodular density in the right lower lobe adjacent to the major fissure, series 2/2. Hepatobiliary: Surface nodularity morphologically consistent with cirrhosis. Left hepatic lobe hypodense mass measuring 4.8 x 4.7 x 3.7 cm is noted, new since prior exam and concerning for hepatocellular carcinoma or potentially metastatic disease. The gallbladder is distended and contains intraluminal hyperdense noncalcified abnormalities. This appears to extend into the common duct and may be contributing to the patient's jaundice. Hyperdense biliary sludge, blood products or intraluminal gallbladder mass might account for this appearance. Pancreas: The pancreas appears atrophic without ductal dilatation. Spleen: No splenomegaly. Adrenals/Urinary Tract: No adrenal mass. There is left adrenal limb thickening. Kidneys are visualized without nephrolithiasis nor obstructive uropathy. No discrete renal mass is identified. The urinary bladder is decompressed and somewhat thickened in appearance as a result. Stomach/Bowel: Physiologic distention of the stomach. No small bowel dilatation. No bowel obstruction. Sympathetic thickening of the intestine likely from surrounding ascites and edema. Circumferential rectal wall thickening possibly representing internal hemorrhoids. Vascular/Lymphatic: Aortoiliac atherosclerosis without aneurysm. Lymphadenopathy is difficult to assess  given paucity intra-abdominal fat and lack of IV contrast. Reproductive: Calcified uterine fibroids are noted. No adnexal mass is seen. Other: Moderate volume of ascites within the abdomen pelvis. Mild soft tissue anasarca. Musculoskeletal: No aggressive osseous lesions. Degenerative disc disease L5-S1. IMPRESSION: 1. Cardiomegaly with small bilateral pleural effusions. 6 mm nodular density in the right lower lobe adjacent to the major fissure. Non-contrast chest CT at 6-12 months is recommended. If the nodule is stable at time of repeat CT, then future CT at 18-24 months (from today's scan) is considered optional for low-risk patients, but is recommended for high-risk patients. This recommendation follows the consensus statement: Guidelines for Management of Incidental Pulmonary Nodules Detected on CT Images: From the Fleischner Society 2017; Radiology 2017; 284:228-243. 2. Morphologic appearance of cirrhosis with moderate ascites. A hypodense mass in the left hepatic lobe measuring 4.8 x 4.7 x 3.7 cm is new since prior comparison and concerning for neoplasm. 3. Gallbladder distention with hyperdense intraluminal masslike abnormalities noted within either representing tumefactive biliary sludge blood products or potentially intraluminal mass. The hyperdensity extends into what appears to be the common duct may be contributing to  the patient's reported jaundice. 4. Nonobstructed, nondistended bowel. Circumferential thickening in the rectum may represent internal hemorrhoids. 5. Calcified uterine fibroids. 6. Moderate-to-marked aortoiliac atherosclerosis. Electronically Signed   By: Ashley Royalty M.D.   On: 04/08/2018 01:39   Dg Chest 1 View  Result Date: 04/07/2018 CLINICAL DATA:  Weakness.  HIV. EXAM: CHEST  1 VIEW COMPARISON:  01/26/2018. FINDINGS: AP portable supine radiograph demonstrates elevated RIGHT hemidiaphragm. Perihilar opacity is new/increased from priors, extending into the RIGHT-sided retrocardiac  region, suspicious for RIGHT lower lobe pneumonia. Streaky LEFT basilar density probably atelectasis. No osseous findings of significance. Previous cervical fusion. Normal heart size. IMPRESSION: Worsening aeration from priors. Query RIGHT perihilar pneumonia with volume loss. Electronically Signed   By: Staci Righter M.D.   On: 04/07/2018 22:49   Ct Head Wo Contrast  Result Date: 04/08/2018 CLINICAL DATA:  Altered level of consciousness EXAM: CT HEAD WITHOUT CONTRAST TECHNIQUE: Contiguous axial images were obtained from the base of the skull through the vertex without intravenous contrast. COMPARISON:  04/11/2014 FINDINGS: Brain: Mild age related involutional changes of the brain. Minimal small vessel ischemic disease of periventricular white matter. No large vascular territory infarct hemorrhage or midline shift. No intra-axial mass nor extra-axial fluid collections. Vascular: No hyperdense vessel sign. Skull: No skull fracture. Sinuses/Orbits: Clear paranasal sinuses and mastoids. Intact orbits and globes. Other: None IMPRESSION: Chronic minimal small vessel ischemic disease of periventricular white matter. No acute intracranial abnormality. Electronically Signed   By: Ashley Royalty M.D.   On: 04/08/2018 01:22   US Renal  Result Date: 04/08/2018 CLINICAL DATA:  67 year old female with a history of acute renal disease EXAM: RENAL / URINARY TRACT ULTRASOUND COMPLETE COMPARISON:  None. FINDINGS: Right Kidney: Length: 13.3 cm. Echogenicity within normal limits. No mass or hydronephrosis visualized. Left Kidney: Length: 12.2 cm. Echogenicity within normal limits. No mass or hydronephrosis visualized. Additional: Multiple mixed echogenicity liver masses are present. Ascites. Bladder: Appears normal for degree of bladder distention. IMPRESSION: No evidence of hydronephrosis. Multiple liver masses, compatible with metastatic disease. Ascites. These results were called by telephone at the time of interpretation on  04/08/2018 at 1:37 pm to Marni Griffon who verbally acknowledged these results. Electronically Signed   By: Corrie Mckusick D.O.   On: 04/08/2018 13:39   Dg Chest Port 1 View  Result Date: 04/08/2018 CLINICAL DATA:  Central line placement EXAM: PORTABLE CHEST 1 VIEW COMPARISON:  04/07/2018 FINDINGS: Right IJ approach central line catheter is seen in the proximal right atrium. No pneumothorax. Heart size is normal. There is mild ectasia of the thoracic aorta. Slight improvement in aeration about the perihilar airspace opacity on the right. Bibasilar atelectasis is noted, left greater than right. ACDF of the included mid and lower cervical spine. IMPRESSION: New right IJ central line catheter is seen in the proximal right atrium without pneumothorax. Slightly improved aeration with respect infrahilar pulmonary consolidation noted previously. Electronically Signed   By: Ashley Royalty M.D.   On: 04/08/2018 01:21   [facility] CTabd/pelvis 5/8: Hepatobiliary: Surface nodularity morphologically consistent with cirrhosis. Left hepatic lobe hypodense mass measuring 4.8 x 4.7 x 3.7 cm is noted, new since prior exam and concerning for hepatocellular carcinoma or potentially metastatic disease. The gallbladder is distended and contains intraluminal hyperdense noncalcified abnormalities. This appears to extend into the common duct and may be contributing to the patient's jaundice. Hyperdense biliary sludge, blood products or intraluminal gallbladder mass might account for this appearance.The urinary bladder is decompressed and somewhat thickened in appearance  as a result. Stomach/Bowel: Physiologic distention of the stomach. No small bowel dilatation. No bowel obstruction. Sympathetic thickening of the intestine likely from surrounding ascites and edema. Circumferential rectal wall thickening possibly representing internal hemorrhoids. Vascular/Lymphatic: Aortoiliac atherosclerosis without aneurysm. Lymphadenopathy  is difficult to assess given paucity intra-abdominal fat and lack of IV contrast. abd [place] 5/9: No evidence of hydronephrosis. Multiple liver masses, compatible with metastatic disease Ascites ECHO 5/9>>>EF 65 to 70%.  LV size normal.  No regional wall abnormality. Grade 2 diastolic heart failure.  Right ventricle slightly dilated systolic function of the right ventricle moderately reduced.  Right atrium moderately dilated.  There is moderate tricuspid regurgitation.  Peak systolic pressure estimated at 37 mmHg  Cultures BCX2 5/9>>> UC 5/9>>> negative Urine legionella 5/9>>> negative  ABX vanc x 1 5/8 Zosyn 5/9>>> Fluconazole 5/9>>>  Lines/tubes Right IJ 5/9>>> Right fem HD cath 5/9>>>  Impression/plan  Septic shock complicated by hypovolemia.  Source of infection likely GI/rule out biliary obstruction also consider spontaneous bacterial peritonitis -Now volume resuscitated, pressors weaned to off Plan Day #3 Zosyn Avoid hypotension Await culture data  Possible non-ST elevation MI with newly identified systolic and diastolic heart murmur EF 65 to 70%.  LV size normal.  No regional wall abnormality. Grade 2 diastolic heart failure.  Right ventricle slightly dilated systolic function of the right ventricle moderately reduced.  Right atrium moderately dilated.  There is moderate tricuspid regurgitation.  Peak systolic pressure estimated at 37 mmHg Plan Continue telemetry monitoring Not a candidate for further cardiac work-up at this point  appears to have significant thrush I suspect she has esophageal candidiasis Plan Day #2 fluconazole  Multiple hepatic masses as well as masslike appearance in gallbladder and common bile duct.  -Total bilirubin, alk phos, and AST elevated -Appreciate GI consultation. Plan Follow-up MR of abdomen with MRCP; ruling out biliary obstruction Follow-up hepatitis panel Will perform diagnostic paracentesis today for fluid analysis as recommended  by GI services to rule out spontaneous bacterial peritonitis May need liver biopsy depending on MRI/MR CP findings Start rifaximin for possible hepatic encephalopathy   Renal failure with marked anion gap metabolic acidosis; felt hemodynamically mediated Plan  CRRT per nephrology  Acute encephalopathy.  Suspect metabolic primarily, in the setting of uremia, acidosis and sepsis -Neurologically about the same Plan Continue supportive care Sedation Correct metabolic derangements  Anemia, etiology unclear -She received 3 units of blood and had appropriate hemoglobin bump no evidence of bleeding currently Plan Trend CBC Transfuse for hemoglobin less than 7  Mild coagulopathy.  Suspect related to hepatic dysfunction as well as sepsis Has received FFP; INR improved Plan Trend INR  HIV/AIDS; absolute CD4 count now 200 Plan Will ask infectious disease to see  Prior thyroidectomy Plan Follow-up TSH  Chronic pain with bilateral avascular necrosis of the hips; head bound essentially at baseline Plan Continue supportive care  Pulmonary nodule Plan Follow-up bridging if she survives   DVT prophylaxis: scd SUP: na  Diet: NPO Activity: br Disposition : ICU critically ill   Care x35 minutes  Erick Colace ACNP-BC [place] Pager # [phone] OR # [phone] if no answer

## 2018-04-09 NOTE — Progress Notes (Signed)
Last documented PNA vaccine in 2012 will order prior to discharge.

## 2018-04-09 NOTE — Consult Note (Signed)
[name] for Infectious Disease    Date of Admission:  04/07/2018   Total days of antibiotics: 2 zosyn               Reason for Consult: HIV+    Referring Provider: Kary Kos   Assessment: Liver masses and pancreatic mass (on MRI) HIV+ ARF Thrush + troponin  Plan: 1. Change zosyn to unasyn 2. Continue symtuza (when able) 3. Continue diflucan 4. Await her Cx 5. Await her Bx of liver mass  Comment- She appears to have metastatic pancreatic cancer? Would continue her anbx until we have negative BCx.    Thank you so much for this interesting consult,  Active Problems:   Septic shock ([place])   Acute renal disease   Acute respiratory failure with hypoxemia ([place])   Acute renal failure ([place])   . chlorhexidine  15 mL Mouth Rinse BID  . Chlorhexidine Gluconate Cloth  6 each Topical Q0600  . Gerhardt's butt cream   Topical BID  . insulin aspart  1-3 Units Subcutaneous Q4H  . mouth rinse  15 mL Mouth Rinse q12n4p  . [START ON 04/10/2018] pneumococcal 23 valent vaccine  0.5 mL Intramuscular Tomorrow-1000  . rifaximin  550 mg Oral BID    HPI: [name] is a 67 y.o. female with hx of HIV+ (on symtuza), bilateral AVN, adm on 5-8 with failure to thrive, encephalopathy. Prior to arriving in ED pt c/o to family of diarrhea and abd pain. Not eating for "days".  In ED pt was intubated, required pressor for hypotension.  Pt also note to have ARF (started on bicarb), troponin 0.39, and increased LFTs. Pt was started on vanco/zosyn.  She underwent CT abd which showed 4.8 x 4.7 x 3.7 cm mass in liver as well as in gallbladder. T Bili 48.9 and Alk Phos 764 INR 1.65 GI has seen and MRCP pending.   HIV 1 RNA Quant (copies/mL)  Date Value  09/10/2017 <20 NOT DETECTED  11/12/2016 <20  12/18/2015 61 (H)   CD4 T Cell Abs (/uL)  Date Value  04/07/2018 200 (L)  09/10/2017 1,120  04/13/2017 1,010    Review of Systems: Review of Systems  Unable to perform ROS: Mental  status change    Past Medical History:  Diagnosis Date  . Anxiety   . Arthritis   . Asthma   . Chronic bronchitis   . COPD (chronic obstructive pulmonary disease) ([place])   . Depression   . Fibromyalgia    "I think I got that"  . Headache(784.0)   . HIV (human immunodeficiency virus infection) ([place])    "dx'd ~ 12/2011"  . Lipoma    right forearm  . 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 Pneumonia ~ 2000's   "once"  . Shortness of breath on exertion   . Thyroid cancer s/p total thyroidectomy April2013 02/06/2012   Per patient report.  Recently diagnosed. Awaiting procedure to have thyroid removed.  Under treatment with Dr Janace Hoard .   Marland Kitchen Vaginal discharge 12/19/2015    Social History   Tobacco Use  . Smoking status: Current Every Day Smoker    Packs/day: 0.50    Years: 47.00    Pack years: 23.50    Types: Cigarettes  . Smokeless tobacco: Former Systems developer    Quit date: 12/01/1974  . Tobacco comment: currently smokes   Substance Use Topics  . Alcohol use:  No    Comment: 03/11/12 "no beer or wine ~ 01/2012"  . Drug use: No    Frequency: 1.0 times per week    Types: Marijuana, Cocaine    Comment: 03/11/12 "last drug use early /2013"    Family History  Problem Relation Age of Onset  . Heart disease Maternal Uncle      Medications:  Scheduled: . chlorhexidine  15 mL Mouth Rinse BID  . Chlorhexidine Gluconate Cloth  6 each Topical Q0600  . Gerhardt's butt cream   Topical BID  . insulin aspart  1-3 Units Subcutaneous Q4H  . mouth rinse  15 mL Mouth Rinse q12n4p  . [START ON 04/10/2018] pneumococcal 23 valent vaccine  0.5 mL Intramuscular Tomorrow-1000  . rifaximin  550 mg Oral BID    Abtx:  Anti-infectives (From admission, onward)   Start     Dose/Rate Route Frequency Ordered Stop   04/09/18 1200  rifaximin (XIFAXAN) tablet 550 mg     550 mg Oral 2 times daily 04/09/18 1109     04/08/18 1400  fluconazole (DIFLUCAN) IVPB 400 mg      400 mg 100 mL/hr over 120 Minutes Intravenous Every 24 hours 04/08/18 1220     04/08/18 0600  piperacillin-tazobactam (ZOSYN) IVPB 2.25 g     2.25 g 100 mL/hr over 30 Minutes Intravenous Every 6 hours 04/08/18 0322     04/07/18 2200  vancomycin (VANCOCIN) 1,500 mg in sodium chloride 0.9 % 500 mL IVPB  Status:  Discontinued     1,500 mg 250 mL/hr over 120 Minutes Intravenous STAT 04/07/18 2156 04/07/18 2158   04/07/18 2200  vancomycin (VANCOCIN) 1,250 mg in sodium chloride 0.9 % 250 mL IVPB     1,250 mg 166.7 mL/hr over 90 Minutes Intravenous STAT 04/07/18 2158 04/08/18 0030   04/07/18 2200  piperacillin-tazobactam (ZOSYN) IVPB 2.25 g     2.25 g 100 mL/hr over 30 Minutes Intravenous STAT 04/07/18 2158 04/07/18 2256   04/07/18 2130  piperacillin-tazobactam (ZOSYN) IVPB 3.375 g  Status:  Discontinued     3.375 g 100 mL/hr over 30 Minutes Intravenous STAT 04/07/18 2128 04/07/18 2158        OBJECTIVE: Blood pressure (!) 86/67, pulse (!) 56, temperature (!) 94.3 F (34.6 C), temperature source Bladder, resp. rate 11, weight 60.7 kg (133 lb 13.1 oz), SpO2 91 %.  Physical Exam  Constitutional: No distress.  HENT:  Mouth/Throat: Oropharyngeal exudate present.  Eyes: Scleral icterus is present.  Neck: Neck supple.    Cardiovascular: Normal rate and regular rhythm.  Pulmonary/Chest: Effort normal and breath sounds normal.  Abdominal: Soft. Bowel sounds are normal. She exhibits no distension. There is no tenderness.  Musculoskeletal: She exhibits no edema.  Lymphadenopathy:    She has no cervical adenopathy.  Skin: Skin is warm and dry.    Lab Results Results for orders placed or performed during the hospital encounter of 04/07/18 (from the past 48 hour(s))  Basic metabolic panel     Status: Abnormal   Collection Time: 04/07/18  8:42 PM  Result Value Ref Range   Sodium 136 135 - 145 mmol/L   Potassium 3.6 3.5 - 5.1 mmol/L   Chloride 100 (L) 101 - 111 mmol/L   CO2 13 (L) 22  - 32 mmol/L   Glucose, Bld 88 65 - 99 mg/dL   BUN 115 (H) 6 - 20 mg/dL    Comment: RESULTS CONFIRMED BY MANUAL DILUTION   Creatinine, Ser 12.06 (H) 0.44 - 1.00  mg/dL   Calcium 7.9 (L) 8.9 - 10.3 mg/dL   GFR calc non Af Amer 3 (L) >60 mL/min   GFR calc Af Amer 3 (L) >60 mL/min    Comment: (NOTE) The eGFR has been calculated using the CKD EPI equation. This calculation has not been validated in all clinical situations. eGFR's persistently <60 mL/min signify possible Chronic Kidney Disease.    Anion gap 23 (H) 5 - 15    Comment: Performed at [facility], [place] [address] [place], [place] [address]  CBC     Status: Abnormal   Collection Time: 04/07/18  8:42 PM  Result Value Ref Range   WBC 22.4 (H) 4.0 - 10.5 K/uL   RBC 1.89 (L) 3.87 - 5.11 MIL/uL   Hemoglobin 6.3 (LL) 12.0 - 15.0 g/dL    Comment: CRITICAL RESULT CALLED TO, READ BACK BY AND VERIFIED WITH: J INMAN,RN _0  04/07/18 MKELLY REPEATED TO VERIFY    HCT 18.9 (L) 36.0 - 46.0 %   MCV 100.0 78.0 - 100.0 fL   MCH 33.3 26.0 - 34.0 pg   MCHC 33.3 30.0 - 36.0 g/dL   RDW 18.0 (H) 11.5 - 15.5 %   Platelets 355 150 - 400 K/uL    Comment: Performed at [facility], [place] [address], [place], [place] [address]  Urinalysis, Routine w reflex microscopic     Status: Abnormal   Collection Time: 04/07/18  8:42 PM  Result Value Ref Range   Color, Urine Cosma (A) YELLOW    Comment: BIOCHEMICALS MAY BE AFFECTED BY COLOR   APPearance CLOUDY (A) CLEAR   Specific Gravity, Urine 1.025 1.005 - 1.030   pH 6.5 5.0 - 8.0   Glucose, UA 100 (A) NEGATIVE mg/dL   Hgb urine dipstick TRACE (A) NEGATIVE   Bilirubin Urine LARGE (A) NEGATIVE   Ketones, ur TRACE (A) NEGATIVE mg/dL   Protein, ur 100 (A) NEGATIVE mg/dL   Nitrite POSITIVE (A) NEGATIVE   Leukocytes, UA NEGATIVE NEGATIVE    Comment: Performed at [facility], [place] [address] [place], [place] [address]  CK     Status: Abnormal    Collection Time: 04/07/18  8:42 PM  Result Value Ref Range   Total CK 643 (H) 38 - 234 U/L    Comment: Performed at [facility], [place] [address] [place], [place] [address]  Urine culture     Status: None   Collection Time: 04/07/18  8:42 PM  Result Value Ref Range   Specimen Description      URINE, CATHETERIZED Performed at [facility], [place] [address], [place], [place] [address]    Special Requests      NONE Performed at [facility], [place] [address], [place], [place] [address]    Culture      NO GROWTH Performed at [facility], [place] [address], [place],  [address]    Report Status 04/09/2018 FINAL   Rapid urine drug screen (hospital performed)     Status: Abnormal   Collection Time: 04/07/18  8:42 PM  Result Value Ref Range   Opiates NONE DETECTED NONE DETECTED   Cocaine POSITIVE (A) NONE DETECTED   Benzodiazepines NONE DETECTED NONE DETECTED   Amphetamines NONE DETECTED NONE DETECTED   Tetrahydrocannabinol NONE DETECTED NONE DETECTED   Barbiturates NONE DETECTED NONE DETECTED    Comment: (NOTE) DRUG SCREEN FOR MEDICAL PURPOSES ONLY.  IF CONFIRMATION IS NEEDED FOR ANY PURPOSE,  NOTIFY LAB WITHIN 5 DAYS. LOWEST DETECTABLE LIMITS FOR URINE DRUG SCREEN Drug Class                     Cutoff (ng/mL) Amphetamine and metabolites    1000 Barbiturate and metabolites    200 Benzodiazepine                 370 Tricyclics and metabolites     300 Opiates and metabolites        300 Cocaine and metabolites        300 THC                            50 Performed at [facility], [place] [address] [place], [place] [address]   Urinalysis, Microscopic (reflex)     Status: Abnormal   Collection Time: 04/07/18  8:42 PM  Result Value Ref Range   RBC / HPF NONE SEEN 0 - 5 RBC/hpf   WBC, UA 0-5 0 - 5 WBC/hpf   Bacteria, UA FEW (A) NONE SEEN   Squamous Epithelial / LPF 0-5 0 - 5    Comment: Performed at  [facility], [place] [address] [place], [place] [address]  Strep pneumoniae urinary antigen  (not at [facility])     Status: Abnormal   Collection Time: 04/07/18  8:42 PM  Result Value Ref Range   Strep Pneumo Urinary Antigen POSITIVE (A) NEGATIVE    Comment: Performed at [place] [address], [place], [place] [address]  Ethanol     Status: None   Collection Time: 04/07/18  9:19 PM  Result Value Ref Range   Alcohol, Ethyl (B) <10 <10 mg/dL    Comment: Performed at [facility] [place] [address], [place], [place] [address]  CBG monitoring, ED     Status: None   Collection Time: 04/07/18  9:20 PM  Result Value Ref Range   Glucose-Capillary 86 65 - 99 mg/dL  I-Stat CG4 Lactic Acid, ED     Status: None   Collection Time: 04/07/18  9:21 PM  Result Value Ref Range   Lactic Acid, Venous 1.22 0.5 - 1.9 mmol/L  Comprehensive metabolic panel     Status: Abnormal   Collection Time: 04/07/18  9:39 PM  Result Value Ref Range   Sodium 136 135 - 145 mmol/L   Potassium 3.6 3.5 - 5.1 mmol/L   Chloride 102 101 - 111 mmol/L   CO2 13 (L) 22 - 32 mmol/L   Glucose, Bld 89 65 - 99 mg/dL   BUN 113 (H) 6 - 20 mg/dL    Comment: RESULTS CONFIRMED BY MANUAL DILUTION   Creatinine, Ser 12.10 (H) 0.44 - 1.00 mg/dL   Calcium 7.7 (L) 8.9 - 10.3 mg/dL   Total Protein 5.9 (L) 6.5 - 8.1 g/dL   Albumin 1.8 (L) 3.5 - 5.0 g/dL   AST 111 (H) 15 - 41 U/L   ALT 45 14 - 54 U/L   Alkaline Phosphatase 764 (H) 38 - 126 U/L   Total Bilirubin 48.9 (HH) 0.3 - 1.2 mg/dL    Comment: RESULTS CONFIRMED BY MANUAL DILUTION CRITICAL RESULT CALLED TO, READ BACK BY AND VERIFIED WITHMilas Gain RN 4917 04/17/18 A NAVARRO    GFR calc non Af Amer 3 (L) >60 mL/min   GFR calc Af Amer 3 (L) >60 mL/min    Comment: (NOTE) The eGFR has  been calculated using the CKD EPI equation. This calculation has not been validated in all clinical situations. eGFR's persistently <60 mL/min signify possible  Chronic Kidney Disease.    Anion gap 21 (H) 5 - 15    Comment: Performed at [facility], [place] [address] [place], [place] [address]  CBC WITH DIFFERENTIAL     Status: Abnormal   Collection Time: 04/07/18  9:39 PM  Result Value Ref Range   WBC 23.4 (H) 4.0 - 10.5 K/uL    Comment: WHITE COUNT CONFIRMED ON SMEAR   RBC 1.83 (L) 3.87 - 5.11 MIL/uL   Hemoglobin 6.1 (LL) 12.0 - 15.0 g/dL    Comment: CRITICAL VALUE NOTED.  VALUE IS CONSISTENT WITH PREVIOUSLY REPORTED AND CALLED VALUE.   HCT 18.3 (L) 36.0 - 46.0 %   MCV 100.0 78.0 - 100.0 fL   MCH 33.3 26.0 - 34.0 pg   MCHC 33.3 30.0 - 36.0 g/dL   RDW 18.1 (H) 11.5 - 15.5 %   Platelets 323 150 - 400 K/uL    Comment: SPECIMEN CHECKED FOR CLOTS PLATELET COUNT CONFIRMED BY SMEAR    Neutrophils Relative % 82 %   Lymphocytes Relative 7 %   Monocytes Relative 3 %   Eosinophils Relative 1 %   Basophils Relative 1 %   Band Neutrophils 0 %   Metamyelocytes Relative 2 %   Myelocytes 2 %   Promyelocytes Relative 2 %   Blasts 0 %   nRBC 0 0 /100 WBC   Other 0 %   Neutro Abs 20.7 (H) 1.7 - 7.7 K/uL   Lymphs Abs 1.6 0.7 - 4.0 K/uL   Monocytes Absolute 0.7 0.1 - 1.0 K/uL   Eosinophils Absolute 0.2 0.0 - 0.7 K/uL   Basophils Absolute 0.2 (H) 0.0 - 0.1 K/uL   RBC Morphology POLYCHROMASIA PRESENT     Comment: TARGET CELLS Performed at [facility], [place] [address], [place], [place] [address]   Blood Cultures (routine x 2)     Status: None (Preliminary result)   Collection Time: 04/07/18  9:39 PM  Result Value Ref Range   Specimen Description      BLOOD RIGHT ANTECUBITAL Performed at [place] [address], [place], [place] [address]    Special Requests      BOTTLES DRAWN AEROBIC AND ANAEROBIC Blood Culture adequate volume Performed at [place] [address], [place], [place] [address]    Culture      NO GROWTH 2 DAYS Performed at [place], [place] [address], [place], [place] [address]    Report Status PENDING   Blood Cultures (routine x 2)     Status: None (Preliminary result)   Collection Time: 04/07/18  9:39 PM  Result Value Ref Range   Specimen Description      BLOOD LEFT ANTECUBITAL Performed at [place] [address], [place], [place] [address]    Special Requests      AEROBIC BOTTLE ONLY Blood Culture adequate volume Performed at [place] [address] [place], [place] [address]    Culture      NO GROWTH 2 DAYS Performed at [facility], [place] [address] [place], [place] [address]    Report Status PENDING   Protime-INR     Status: Abnormal   Collection Time: 04/07/18  9:39 PM  Result Value Ref Range   Prothrombin Time 19.3 (H) 11.4 -  15.2 seconds   INR 1.65     Comment: Performed at [facility], [place] [address] [place], [place] [address]  Ammonia     Status: Abnormal   Collection Time: 04/07/18  9:39 PM  Result Value Ref Range   Ammonia 67 (H) 9 - 35 umol/L    Comment: Performed at [facility], [place] [address], [place], [place] [address]  T-helper cells (CD4) count (not at [facility])     Status: Abnormal   Collection Time: 04/07/18  9:39 PM  Result Value Ref Range   CD4 T Cell Abs 200 (L) 400 - 2,700 /uL   CD4 % Helper T Cell 28 (L) 33 - 55 %    Comment: Performed at [facility], [place] [address], [place], [place] [address]  Cortisol     Status: None   Collection Time: 04/07/18  9:39 PM  Result Value Ref Range   Cortisol, Plasma 18.3 ug/dL    Comment: (NOTE) AM    6.7 - 22.6 ug/dL PM   <10.0       ug/dL Performed at [place] [address] [place], [place] [address]   Blood gas, venous     Status: Abnormal   Collection Time: 04/07/18  9:47 PM  Result Value Ref Range   pH, Ven 7.148 (LL) 7.250 - 7.430    Comment: CRITICAL RESULT CALLED TO, READ BACK BY AND VERIFIED WITH: Edd Fabian, RN AT 2151 BY TABATHA KNAPP, RRT,RCP ON 04/07/18    pCO2, Ven 35.7 (L) 44.0 - 60.0 mmHg   pO2, Ven VALUE BELOW REPORTABLE RANGE 32.0 - 45.0 mmHg    Comment: CRITICAL RESULT CALLED TO, READ BACK BY AND VERIFIED WITH: Edd Fabian, RN AT 2151 BY TABATHA KNAPP, RRT,RCP ON 04/07/18    Bicarbonate 11.9 (L) 20.0 - 28.0 mmol/L   Acid-base deficit 15.4 (H) 0.0 - 2.0 mmol/L   O2 Saturation 25.3 %   Patient temperature 98.6    Collection site VENOUS    Drawn by  Edd Fabian    Sample type VENOUS     Comment: Performed at [facility], [place] [address] [place], [place] [address]  Type and screen [place]     Status: None (Preliminary result)   Collection Time: 04/07/18 10:04 PM  Result Value Ref Range   ABO/RH(D) O POS    Antibody Screen NEG    Sample Expiration 04/10/2018    Unit Number [id number]    Blood Component Type RBC LR PHER1    Unit division 00    Status of Unit ISSUED,FINAL    Transfusion Status OK TO TRANSFUSE    Crossmatch Result Compatible    Unit Number [id number]    Blood Component Type RBC LR PHER2    Unit division 00    Status of Unit ISSUED,FINAL    Transfusion Status OK TO TRANSFUSE    Crossmatch Result Compatible    Unit Number [id number]    Blood Component Type RED CELLS,LR    Unit division 00    Status of Unit ALLOCATED    Transfusion Status OK TO TRANSFUSE    Crossmatch Result Compatible    Unit Number [id number]    Blood Component Type RED CELLS,LR    Unit division 00    Status of Unit ISSUED,FINAL    Transfusion Status OK TO TRANSFUSE    Crossmatch Result      Compatible Performed at [facility]  [facility], [place] [address] [place], [place] [address]   Prepare RBC     Status: None   Collection Time: 04/07/18 10:04 PM  Result Value Ref Range   Order Confirmation      ORDER PROCESSED BY BLOOD BANK Performed at [facility], [place] [address] [place], [place] [address]     ABO/Rh     Status: None   Collection Time: 04/07/18 10:04 PM  Result Value Ref Range   ABO/RH(D)      Jenetta Downer POS Performed at [facility], [place] [address] [place], [place] [address]   Troponin I     Status: Abnormal   Collection Time: 04/07/18 10:18 PM  Result Value Ref Range   Troponin I 0.39 (HH) <0.03 ng/mL    Comment: CRITICAL RESULT CALLED TO, READ BACK BY AND VERIFIED WITHArnold Long RN 2248 04/07/18 A NAVARRO Performed at [facility], [place] [address] [place], [place] [address]   I-Stat CG4 Lactic Acid, ED     Status: None   Collection Time: 04/07/18 11:51 PM  Result Value Ref Range   Lactic Acid, Venous 0.74 0.5 - 1.9 mmol/L  I-Stat Troponin, ED (not at [facility])     Status: Abnormal   Collection Time: 04/08/18  1:41 AM  Result Value Ref Range   Troponin i, poc 0.25 (HH) 0.00 - 0.08 ng/mL   Comment NOTIFIED PHYSICIAN    Comment 3            Comment: Due to the release kinetics of cTnI, a negative result within the first hours of the onset of symptoms does not rule out myocardial infarction with certainty. If myocardial infarction is still suspected, repeat the test at appropriate intervals.   Prepare fresh frozen plasma     Status: None   Collection Time: 04/08/18  2:06 AM  Result Value Ref Range   Unit Number [id number]    Blood Component Type THAWED PLASMA    Unit division 00    Status of Unit ISSUED,FINAL    Transfusion Status      OK TO TRANSFUSE Performed at [place] [address], [place], [place] [address]    Unit Number [id number]    Blood Component Type THAWED PLASMA    Unit division 00    Status of Unit ISSUED,FINAL    Transfusion Status OK TO TRANSFUSE   Prepare RBC     Status: None   Collection Time: 04/08/18  2:07 AM  Result Value Ref Range   Order Confirmation      ORDER PROCESSED BY BLOOD BANK Performed at [facility], [place] [address] [place], [place]  [address]   MRSA PCR Screening     Status: None   Collection Time: 04/08/18  9:11 AM  Result Value Ref Range   MRSA by PCR NEGATIVE NEGATIVE    Comment:        The GeneXpert MRSA Assay (FDA approved for NASAL specimens only), is one component of a comprehensive MRSA colonization surveillance program. It is not intended to diagnose MRSA infection nor to guide or monitor treatment for MRSA infections. Performed at [facility], [place] [address], [place],  [address]   Glucose, capillary     Status: Abnormal   Collection Time: 04/08/18 10:16 AM  Result Value Ref Range   Glucose-Capillary 155 (H) 65 - 99 mg/dL   Comment 1 Capillary Specimen   Urinalysis, Routine w reflex microscopic  Status: Abnormal   Collection Time: 04/08/18 10:31 AM  Result Value Ref Range   Color, Urine [place] (A) YELLOW   APPearance TURBID (A) CLEAR   Specific Gravity, Urine  1.005 - 1.030    TEST NOT REPORTED DUE TO COLOR INTERFERENCE OF URINE PIGMENT   pH  5.0 - 8.0    TEST NOT REPORTED DUE TO COLOR INTERFERENCE OF URINE PIGMENT   Glucose, UA (A) NEGATIVE mg/dL    TEST NOT REPORTED DUE TO COLOR INTERFERENCE OF URINE PIGMENT   Hgb urine dipstick (A) NEGATIVE    TEST NOT REPORTED DUE TO COLOR INTERFERENCE OF URINE PIGMENT   Bilirubin Urine (A) NEGATIVE    TEST NOT REPORTED DUE TO COLOR INTERFERENCE OF URINE PIGMENT   Ketones, ur (A) NEGATIVE mg/dL    TEST NOT REPORTED DUE TO COLOR INTERFERENCE OF URINE PIGMENT   Protein, ur (A) NEGATIVE mg/dL    TEST NOT REPORTED DUE TO COLOR INTERFERENCE OF URINE PIGMENT   Nitrite (A) NEGATIVE    TEST NOT REPORTED DUE TO COLOR INTERFERENCE OF URINE PIGMENT   Leukocytes, UA (A) NEGATIVE    TEST NOT REPORTED DUE TO COLOR INTERFERENCE OF URINE PIGMENT   RBC / HPF 0-5 0 - 5 RBC/hpf   WBC, UA 0-5 0 - 5 WBC/hpf   Bacteria, UA FEW (A) NONE SEEN   Squamous Epithelial / LPF 0-5 0 - 5    Comment: Performed at [facility], [place] [address] [place], [place]  [address]  Glucose, capillary     Status: Abnormal   Collection Time: 04/08/18 12:11 PM  Result Value Ref Range   Glucose-Capillary 127 (H) 65 - 99 mg/dL   Comment 1 Capillary Specimen   Renal function panel (daily at 1600)     Status: Abnormal   Collection Time: 04/08/18  2:22 PM  Result Value Ref Range   Sodium 137 135 - 145 mmol/L   Potassium 2.6 (LL) 3.5 - 5.1 mmol/L    Comment: CRITICAL RESULT CALLED TO, READ BACK BY AND VERIFIED WITH: Ouida Sills RN _0  ON 04/08/18 BY HTEMOCHE    Chloride 101 101 - 111 mmol/L   CO2 18 (L) 22 - 32 mmol/L   Glucose, Bld 125 (H) 65 - 99 mg/dL   BUN 99 (H) 6 - 20 mg/dL   Creatinine, Ser 10.15 (H) 0.44 - 1.00 mg/dL   Calcium 6.4 (LL) 8.9 - 10.3 mg/dL    Comment: CRITICAL RESULT CALLED TO, READ BACK BY AND VERIFIED WITH: Ouida Sills RN _1  ON 04/08/18 BY HTEMOCHE    Phosphorus 7.6 (H) 2.5 - 4.6 mg/dL   Albumin 1.7 (L) 3.5 - 5.0 g/dL   GFR calc non Af Amer 4 (L) >60 mL/min   GFR calc Af Amer 4 (L) >60 mL/min    Comment: (NOTE) The eGFR has been calculated using the CKD EPI equation. This calculation has not been validated in all clinical situations. eGFR's persistently <60 mL/min signify possible Chronic Kidney Disease.    Anion gap 18 (H) 5 - 15    Comment: Performed at [facility], [place] [address], [place], [place] [address]  AFP tumor marker     Status: None   Collection Time: 04/08/18  3:47 PM  Result Value Ref Range   AFP, Serum, Tumor Marker 1.6 0.0 - 8.3 ng/mL    Comment: (NOTE) [employer] Electrochemiluminescence Immunoassay (ECLIA) Values obtained with different assay methods or kits cannot be used interchangeably.  Results cannot  be interpreted as absolute evidence of the presence or absence of malignant disease. This test is not interpretable in pregnant females. Performed At: [facility] [place], [place] [id number] Rush Farmer MD [id number] Performed at [facility], [place]  [address] [place], [place] [address]   C difficile quick scan w PCR reflex     Status: None   Collection Time: 04/08/18  4:28 PM  Result Value Ref Range   C Diff antigen NEGATIVE NEGATIVE   C Diff toxin NEGATIVE NEGATIVE   C Diff interpretation No C. difficile detected.     Comment: Performed at [facility], [place] [address], [place], [place] [address]  Glucose, capillary     Status: Abnormal   Collection Time: 04/08/18  8:04 PM  Result Value Ref Range   Glucose-Capillary 101 (H) 65 - 99 mg/dL   Comment 1 Notify RN   Hemoglobin and hematocrit, blood     Status: Abnormal   Collection Time: 04/08/18 10:17 PM  Result Value Ref Range   Hemoglobin 9.9 (L) 12.0 - 15.0 g/dL   HCT 28.1 (L) 36.0 - 46.0 %    Comment: Performed at [facility] [address], [place], [place] [address]  Type and screen [place]     Status: None   Collection Time: 04/08/18 10:19 PM  Result Value Ref Range   ABO/RH(D) O POS    Antibody Screen NEG    Sample Expiration      04/11/2018 Performed at [facility], [place] [address] [place], [place] [address]   ABO/Rh     Status: None   Collection Time: 04/08/18 10:19 PM  Result Value Ref Range   ABO/RH(D)      O POS Performed at [facility] [address], [place], [place] [address]   Glucose, capillary     Status: None   Collection Time: 04/09/18 12:11 AM  Result Value Ref Range   Glucose-Capillary 99 65 - 99 mg/dL   Comment 1 Notify RN   Ammonia     Status: Abnormal   Collection Time: 04/09/18  4:04 AM  Result Value Ref Range   Ammonia 62 (H) 9 - 35 umol/L    Comment: Performed at [facility], [place] [address], [place], [place] [address]  TSH     Status: Abnormal   Collection Time: 04/09/18  4:05 AM  Result Value Ref Range   TSH 15.913 (H) 0.350 - 4.500 uIU/mL    Comment: Performed by a 3rd Generation assay with a functional sensitivity of <=0.01 uIU/mL. Performed at [facility], [place] [address], [place], [place] [address]   Glucose, capillary     Status: None   Collection Time: 04/09/18  4:36 AM  Result Value Ref Range   Glucose-Capillary 92 65 - 99 mg/dL  Renal function panel (daily at 0500)     Status: Abnormal   Collection Time: 04/09/18  5:15 AM  Result Value Ref Range   Sodium 138 135 - 145 mmol/L   Potassium 3.0 (L) 3.5 - 5.1 mmol/L   Chloride 98 (L) 101 - 111 mmol/L   CO2 22 22 - 32 mmol/L   Glucose, Bld 108 (H) 65 - 99 mg/dL   BUN 48 (H) 6 - 20 mg/dL   Creatinine, Ser 4.91 (H) 0.44 - 1.00 mg/dL    Comment: ICTERUS AT THIS LEVEL MAY AFFECT RESULT DELTA CHECK NOTED  Calcium 7.2 (L) 8.9 - 10.3 mg/dL   Phosphorus 3.2 2.5 - 4.6 mg/dL   Albumin 1.8 (L) 3.5 - 5.0 g/dL   GFR calc non Af Amer 8 (L) >60 mL/min   GFR calc Af Amer 10 (L) >60 mL/min    Comment: (NOTE) The eGFR has been calculated using the CKD EPI equation. This calculation has not been validated in all clinical situations. eGFR's persistently <60 mL/min signify possible Chronic Kidney Disease.    Anion gap 18 (H) 5 - 15    Comment: Performed at [facility], [place] [address], [place], [place] [address]  Magnesium     Status: None   Collection Time: 04/09/18  5:15 AM  Result Value Ref Range   Magnesium 2.0 1.7 - 2.4 mg/dL    Comment: Performed at [facility] [address], [place], [place] [address]  Protime-INR     Status: Abnormal   Collection Time: 04/09/18  5:15 AM  Result Value Ref Range   Prothrombin Time 19.1 (H) 11.4 - 15.2 seconds   INR 1.62     Comment: Performed at [facility] [address], [place], [place] [address]  Procalcitonin     Status: None   Collection Time: 04/09/18  5:15 AM  Result Value Ref Range   Procalcitonin 4.96 ng/mL    Comment:        Interpretation: PCT > 2 ng/mL: Systemic infection (sepsis) is likely, unless other causes are known. QUESTIONABLE RESULTS, RECOMMEND RECOLLECT TO VERIFY ICTERUS AT THIS LEVEL MAY AFFECT RESULT (NOTE)        Sepsis PCT Algorithm           Lower Respiratory Tract                                      Infection PCT Algorithm    ----------------------------     ----------------------------         PCT < 0.25 ng/mL                PCT < 0.10 ng/mL         Strongly encourage             Strongly discourage   discontinuation of antibiotics    initiation of antibiotics    ----------------------------     -----------------------------       PCT 0.25 - 0.50 ng/mL            PCT 0.10 - 0.25 ng/mL               OR       >80% decrease in PCT            Discourage initiation of                                            antibiotics      Encourage discontinuation           of antibiotics    ----------------------------     -----------------------------         PCT >= 0.50 ng /mL              PCT 0.26 - 0.50 ng/mL  AND       <80% decrease in PCT             Encourage initiation of                                             antibiotics       Encourage continuation           of antibiotics    ----------------------------     -----------------------------        PCT >= 0.50 ng/mL                  PCT > 0.50 ng/mL               AND         increase in PCT                  Strongly encourage                                      initiation of antibiotics    Strongly encourage escalation           of antibiotics                                     -----------------------------                                           PCT <= 0.25 ng/mL                                                 OR                                        > 80% decrease in PCT                                     Discontinue / Do not initiate                                             antibiotics Performed at [facility], [place] [address], [place],  [place] [address]   CBC     Status: Abnormal   Collection Time: 04/09/18  5:15 AM  Result Value Ref Range   WBC 21.9 (H) 4.0 - 10.5 K/uL   RBC 3.15 (L) 3.87 - 5.11  MIL/uL   Hemoglobin 9.9 (L) 12.0 - 15.0 g/dL   HCT 28.1 (L) 36.0 - 46.0 %   MCV 89.2 78.0 - 100.0 fL   MCH 31.4 26.0 - 34.0 pg   MCHC 35.2 30.0 - 36.0 g/dL   RDW 17.9 (H) 11.5 -  15.5 %   Platelets 166 150 - 400 K/uL    Comment: Performed at [facility], [place] [address] [place], [place] [address]  Hepatic function panel     Status: Abnormal   Collection Time: 04/09/18  5:15 AM  Result Value Ref Range   Total Protein 5.7 (L) 6.5 - 8.1 g/dL   Albumin 1.8 (L) 3.5 - 5.0 g/dL   AST 104 (H) 15 - 41 U/L   ALT 38 14 - 54 U/L   Alkaline Phosphatase 675 (H) 38 - 126 U/L   Total Bilirubin 49.9 (HH) 0.3 - 1.2 mg/dL    Comment: RESULTS CONFIRMED BY MANUAL DILUTION CRITICAL RESULT CALLED TO, READ BACK BY AND VERIFIED WITH: MOTLEY J,RN 04/09/18 0642 WAYK    Bilirubin, Direct >30.0 (H) 0.1 - 0.5 mg/dL    Comment: RESULTS CONFIRMED BY MANUAL DILUTION   Indirect Bilirubin NOT CALCULATED 0.3 - 0.9 mg/dL    Comment: Performed at [facility] [address] [place], [place] [address]  Troponin I     Status: Abnormal   Collection Time: 04/09/18  5:15 AM  Result Value Ref Range   Troponin I 0.25 (HH) <0.03 ng/mL    Comment: CRITICAL RESULT CALLED TO, READ BACK BY AND VERIFIED WITH: MOTLEY J,RN 04/09/18 [place] Performed at [facility] [address], [place], [place] [address]   CK     Status: Abnormal   Collection Time: 04/09/18  5:15 AM  Result Value Ref Range   Total CK 618 (H) 38 - 234 U/L    Comment: Performed at [facility], [place] [address], [place], [place] [address]  Glucose, capillary     Status: None   Collection Time: 04/09/18  7:44 AM  Result Value Ref Range   Glucose-Capillary 97 65 - 99 mg/dL   Comment 1 Capillary Specimen    Comment 2 Notify RN       Component Value Date/Time   SDES  04/07/2018 2139    BLOOD RIGHT ANTECUBITAL Performed at [facility], [place] [address], [place], [place] [address]    SDES  04/07/2018 2139     BLOOD LEFT ANTECUBITAL Performed at [facility] [address], [place], [place] [address]    SPECREQUEST  04/07/2018 2139    BOTTLES DRAWN AEROBIC AND ANAEROBIC Blood Culture adequate volume Performed at [facility] [address], [place], [place] [address]    SPECREQUEST  04/07/2018 2139    AEROBIC BOTTLE ONLY Blood Culture adequate volume Performed at [facility] [address] [place], [place] [address]    CULT  04/07/2018 2139    NO GROWTH 2 DAYS Performed at [facility] [address] [place], [place] [address]    CULT  04/07/2018 2139    NO GROWTH 2 DAYS Performed at [facility] [address] [place], [place] [address]    REPTSTATUS PENDING 04/07/2018 2139   REPTSTATUS PENDING 04/07/2018 2139   Ct Abdomen Pelvis Wo Contrast  Result Date: 04/08/2018 CLINICAL DATA:  Failure to thrive the past 4 days. Jaundice and incontinence. Leukocytosis. EXAM: CT ABDOMEN AND PELVIS WITHOUT CONTRAST TECHNIQUE: Multidetector CT imaging of the abdomen and pelvis was performed following the standard protocol without IV contrast. COMPARISON:  05/09/2012 CT FINDINGS: Lower chest: Tip of central line catheter is seen in the proximal right atrium. Mild cardiomegaly without pericardial effusion. Bilateral small pleural effusions with  atelectasis at each lung base. 6 mm nodular density in the right lower lobe adjacent to the major fissure, series 2/2. Hepatobiliary: Surface nodularity morphologically consistent with cirrhosis. Left hepatic lobe hypodense mass measuring 4.8 x 4.7 x 3.7 cm is noted, new since prior exam and concerning for hepatocellular carcinoma or potentially metastatic disease. The gallbladder is distended and contains intraluminal hyperdense noncalcified abnormalities. This appears to extend into the common duct and may be contributing to the patient's jaundice. Hyperdense biliary sludge, blood products or  intraluminal gallbladder mass might account for this appearance. Pancreas: The pancreas appears atrophic without ductal dilatation. Spleen: No splenomegaly. Adrenals/Urinary Tract: No adrenal mass. There is left adrenal limb thickening. Kidneys are visualized without nephrolithiasis nor obstructive uropathy. No discrete renal mass is identified. The urinary bladder is decompressed and somewhat thickened in appearance as a result. Stomach/Bowel: Physiologic distention of the stomach. No small bowel dilatation. No bowel obstruction. Sympathetic thickening of the intestine likely from surrounding ascites and edema. Circumferential rectal wall thickening possibly representing internal hemorrhoids. Vascular/Lymphatic: Aortoiliac atherosclerosis without aneurysm. Lymphadenopathy is difficult to assess given paucity intra-abdominal fat and lack of IV contrast. Reproductive: Calcified uterine fibroids are noted. No adnexal mass is seen. Other: Moderate volume of ascites within the abdomen pelvis. Mild soft tissue anasarca. Musculoskeletal: No aggressive osseous lesions. Degenerative disc disease L5-S1. IMPRESSION: 1. Cardiomegaly with small bilateral pleural effusions. 6 mm nodular density in the right lower lobe adjacent to the major fissure. Non-contrast chest CT at 6-12 months is recommended. If the nodule is stable at time of repeat CT, then future CT at 18-24 months (from today's scan) is considered optional for low-risk patients, but is recommended for high-risk patients. This recommendation follows the consensus statement: Guidelines for Management of Incidental Pulmonary Nodules Detected on CT Images: From the Fleischner Society 2017; Radiology 2017; 284:228-243. 2. Morphologic appearance of cirrhosis with moderate ascites. A hypodense mass in the left hepatic lobe measuring 4.8 x 4.7 x 3.7 cm is new since prior comparison and concerning for neoplasm. 3. Gallbladder distention with hyperdense intraluminal masslike  abnormalities noted within either representing tumefactive biliary sludge blood products or potentially intraluminal mass. The hyperdensity extends into what appears to be the common duct may be contributing to the patient's reported jaundice. 4. Nonobstructed, nondistended bowel. Circumferential thickening in the rectum may represent internal hemorrhoids. 5. Calcified uterine fibroids. 6. Moderate-to-marked aortoiliac atherosclerosis. Electronically Signed   By: Ashley Royalty M.D.   On: 04/08/2018 01:39   Dg Chest 1 View  Result Date: 04/07/2018 CLINICAL DATA:  Weakness.  HIV. EXAM: CHEST  1 VIEW COMPARISON:  01/26/2018. FINDINGS: AP portable supine radiograph demonstrates elevated RIGHT hemidiaphragm. Perihilar opacity is new/increased from priors, extending into the RIGHT-sided retrocardiac region, suspicious for RIGHT lower lobe pneumonia. Streaky LEFT basilar density probably atelectasis. No osseous findings of significance. Previous cervical fusion. Normal heart size. IMPRESSION: Worsening aeration from priors. Query RIGHT perihilar pneumonia with volume loss. Electronically Signed   By: Staci Righter M.D.   On: 04/07/2018 22:49   Ct Head Wo Contrast  Result Date: 04/08/2018 CLINICAL DATA:  Altered level of consciousness EXAM: CT HEAD WITHOUT CONTRAST TECHNIQUE: Contiguous axial images were obtained from the base of the skull through the vertex without intravenous contrast. COMPARISON:  04/11/2014 FINDINGS: Brain: Mild age related involutional changes of the brain. Minimal small vessel ischemic disease of periventricular white matter. No large vascular territory infarct hemorrhage or midline shift. No intra-axial mass nor extra-axial fluid collections. Vascular: No hyperdense vessel sign.  Skull: No skull fracture. Sinuses/Orbits: Clear paranasal sinuses and mastoids. Intact orbits and globes. Other: None IMPRESSION: Chronic minimal small vessel ischemic disease of periventricular white matter. No acute  intracranial abnormality. Electronically Signed   By: Ashley Royalty M.D.   On: 04/08/2018 01:22   US Renal  Result Date: 04/08/2018 CLINICAL DATA:  67 year old female with a history of acute renal disease EXAM: RENAL / URINARY TRACT ULTRASOUND COMPLETE COMPARISON:  None. FINDINGS: Right Kidney: Length: 13.3 cm. Echogenicity within normal limits. No mass or hydronephrosis visualized. Left Kidney: Length: 12.2 cm. Echogenicity within normal limits. No mass or hydronephrosis visualized. Additional: Multiple mixed echogenicity liver masses are present. Ascites. Bladder: Appears normal for degree of bladder distention. IMPRESSION: No evidence of hydronephrosis. Multiple liver masses, compatible with metastatic disease. Ascites. These results were called by telephone at the time of interpretation on 04/08/2018 at 1:37 pm to Marni Griffon who verbally acknowledged these results. Electronically Signed   By: Corrie Mckusick D.O.   On: 04/08/2018 13:39   Mr Abdomen Mrcp Wo Contrast  Result Date: 04/09/2018 CLINICAL DATA:  Abnormal LFTs EXAM: MRI ABDOMEN WITHOUT CONTRAST  (INCLUDING MRCP) TECHNIQUE: Multiplanar multisequence MR imaging of the abdomen was performed. Heavily T2-weighted images of the biliary and pancreatic ducts were obtained, and three-dimensional MRCP images were rendered by post processing. COMPARISON:  CT abdomen/pelvis dated 04/08/2017 FINDINGS: Severely motion degraded images. Lower chest: Small bilateral pleural effusions. Associated lower lobe opacities, likely atelectasis. Hepatobiliary: Numerous hepatic metastases, measuring up to 5.0 cm in segment 4A (series 4001/image 11) and 6.3 cm in segment 5 (series 4001/image 16). Distended gallbladder with cholelithiasis. Intrahepatic and extrahepatic ductal dilatation, with layering sludge/gallstones within the distal common duct (series 8001/image 21). Pancreas: Mass-like enlargement of the pancreatic tail, measuring 2.7 x 5.3 cm (series 4001/image 17).  Spleen:  Within normal limits. Adrenals/Urinary Tract:  Adrenal glands are grossly unremarkable. Kidneys are within normal limits.  No hydronephrosis. Stomach/Bowel: 2.4 cm cystic lesion along the gastric cardia (series 4001/image 20). Stomach is otherwise unremarkable. Visualized bowel is grossly unremarkable. Vascular/Lymphatic:  No evidence of abdominal aortic aneurysm. 1.6 cm short axis portacaval node (series 8001/image 20). Other:  Moderate abdominal ascites. Suspected peritoneal disease/omental caking beneath the left mid anterior abdominal wall (series 8001/image 25), although poorly evaluated. Musculoskeletal: Heterogeneous marrow signal in the visualized thoracolumbar spine, metastases not excluded. IMPRESSION: Limited evaluation due to severe motion degradation and lack of intravenous contrast administration. Mass-like enlargement of the pancreatic tail, measuring 2.7 x 5.3 cm, worrisome for primary pancreatic neoplasm given additional findings. 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 Moderate abdominal ascites. Suspected peritoneal disease/omental caking beneath the left mid anterior abdominal wall. 1.6 cm short axis portacaval node. Distended gallbladder with cholelithiasis. Intrahepatic and extrahepatic ductal dilatation with layering sludge/stones in the common duct. Electronically Signed   By: Julian Hy M.D.   On: 04/09/2018 12:26   Mr 3d Recon At Scanner  Result Date: 04/09/2018 CLINICAL DATA:  Abnormal LFTs EXAM: MRI ABDOMEN WITHOUT CONTRAST  (INCLUDING MRCP) TECHNIQUE: Multiplanar multisequence MR imaging of the abdomen was performed. Heavily T2-weighted images of the biliary and pancreatic ducts were obtained, and three-dimensional MRCP images were rendered by post processing. COMPARISON:  CT abdomen/pelvis dated 04/08/2017 FINDINGS: Severely motion degraded images. Lower chest: Small bilateral pleural effusions. Associated lower lobe opacities, likely  atelectasis. Hepatobiliary: Numerous hepatic metastases, measuring up to 5.0 cm in segment 4A (series 4001/image 11) and 6.3 cm in segment 5 (series 4001/image 16). Distended  gallbladder with cholelithiasis. Intrahepatic and extrahepatic ductal dilatation, with layering sludge/gallstones within the distal common duct (series 8001/image 21). Pancreas: Mass-like enlargement of the pancreatic tail, measuring 2.7 x 5.3 cm (series 4001/image 17). Spleen:  Within normal limits. Adrenals/Urinary Tract:  Adrenal glands are grossly unremarkable. Kidneys are within normal limits.  No hydronephrosis. Stomach/Bowel: 2.4 cm cystic lesion along the gastric cardia (series 4001/image 20). Stomach is otherwise unremarkable. Visualized bowel is grossly unremarkable. Vascular/Lymphatic:  No evidence of abdominal aortic aneurysm. 1.6 cm short axis portacaval node (series 8001/image 20). Other:  Moderate abdominal ascites. Suspected peritoneal disease/omental caking beneath the left mid anterior abdominal wall (series 8001/image 25), although poorly evaluated. Musculoskeletal: Heterogeneous marrow signal in the visualized thoracolumbar spine, metastases not excluded. IMPRESSION: Limited evaluation due to severe motion degradation and lack of intravenous contrast administration. Mass-like enlargement of the pancreatic tail, measuring 2.7 x 5.3 cm, worrisome for primary pancreatic neoplasm given additional findings. 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 Moderate abdominal ascites. Suspected peritoneal disease/omental caking beneath the left mid anterior abdominal wall. 1.6 cm short axis portacaval node. Distended gallbladder with cholelithiasis. Intrahepatic and extrahepatic ductal dilatation with layering sludge/stones in the common duct. Electronically Signed   By: Julian Hy M.D.   On: 04/09/2018 12:26   Dg Chest Port 1 View  Result Date: 04/09/2018 CLINICAL DATA:  Pneumonia. EXAM: PORTABLE CHEST  1 VIEW COMPARISON:  04/08/2018 FINDINGS: The patient is mildly rotated to the right. Right jugular catheter terminates over the lower SVC. The cardiomediastinal silhouette is unchanged allowing for patient rotation. The lungs are hypoinflated. Right infrahilar airspace opacity is partially obscured by superimposition of the heart due to patient rotation but has not grossly worsened. There is mild patchy and linear opacity in the left lung base which is stable to slightly increased. Mild hazy perihilar opacity is present bilaterally, and the interstitial markings are mildly accentuated diffusely. There may be tiny bilateral pleural effusions. No pneumothorax is identified. IMPRESSION: 1. Low lung volumes with patchy bibasilar opacities which are stable to slightly worsened and which may reflect pneumonia or atelectasis. 2. Hazy perihilar opacities and interstitial prominence may reflect mild edema with tiny pleural effusions. Electronically Signed   By: Logan Bores M.D.   On: 04/09/2018 11:36   Dg Chest Port 1 View  Result Date: 04/08/2018 CLINICAL DATA:  Central line placement EXAM: PORTABLE CHEST 1 VIEW COMPARISON:  04/07/2018 FINDINGS: Right IJ approach central line catheter is seen in the proximal right atrium. No pneumothorax. Heart size is normal. There is mild ectasia of the thoracic aorta. Slight improvement in aeration about the perihilar airspace opacity on the right. Bibasilar atelectasis is noted, left greater than right. ACDF of the included mid and lower cervical spine. IMPRESSION: New right IJ central line catheter is seen in the proximal right atrium without pneumothorax. Slightly improved aeration with respect infrahilar pulmonary consolidation noted previously. Electronically Signed   By: Ashley Royalty M.D.   On: 04/08/2018 01:21   Recent Results (from the past 240 hour(s))  Urine culture     Status: None   Collection Time: 04/07/18  8:42 PM  Result Value Ref Range Status   Specimen  Description   Final    URINE, CATHETERIZED Performed at [facility] [address], [place], [place] [address]    Special Requests   Final    NONE Performed at [facility], [place] [address] [place], [place] [address]    Culture  Final    NO GROWTH Performed at [facility], [place] [address] [place], [place] [address]    Report Status 04/09/2018 FINAL  Final  Blood Cultures (routine x 2)     Status: None (Preliminary result)   Collection Time: 04/07/18  9:39 PM  Result Value Ref Range Status   Specimen Description   Final    BLOOD RIGHT ANTECUBITAL Performed at [facility] [address] [place], [place] [address]    Special Requests   Final    BOTTLES DRAWN AEROBIC AND ANAEROBIC Blood Culture adequate volume Performed at [facility] [address], [place], [place] [address]    Culture   Final    NO GROWTH 2 DAYS Performed at [facility] [address], [place], [place] [address]    Report Status PENDING  Incomplete  Blood Cultures (routine x 2)     Status: None (Preliminary result)   Collection Time: 04/07/18  9:39 PM  Result Value Ref Range Status   Specimen Description   Final    BLOOD LEFT ANTECUBITAL Performed at [facility] [address] [place], [place] [address]    Special Requests   Final    AEROBIC BOTTLE ONLY Blood Culture adequate volume Performed at [facility] [address], [place], [place] [address]    Culture   Final    NO GROWTH 2 DAYS Performed at [facility] [address], [place], [place] [address]    Report Status PENDING  Incomplete  MRSA PCR Screening     Status: None   Collection Time: 04/08/18  9:11 AM  Result Value Ref Range Status   MRSA by PCR NEGATIVE NEGATIVE Final    Comment:        The GeneXpert MRSA Assay (FDA approved for NASAL specimens only), is one component of a comprehensive  MRSA colonization surveillance program. It is not intended to diagnose MRSA infection nor to guide or monitor treatment for MRSA infections. Performed at [facility], [place] [address] [place], [place] [address]   C difficile quick scan w PCR reflex     Status: None   Collection Time: 04/08/18  4:28 PM  Result Value Ref Range Status   C Diff antigen NEGATIVE NEGATIVE Final   C Diff toxin NEGATIVE NEGATIVE Final   C Diff interpretation No C. difficile detected.  Final    Comment: Performed at [facility], [place] [address], [place], [place] [address]    Microbiology: Recent Results (from the past 240 hour(s))  Urine culture     Status: None   Collection Time: 04/07/18  8:42 PM  Result Value Ref Range Status   Specimen Description   Final    URINE, CATHETERIZED Performed at [facility] [address], [place], [place] [address]    Special Requests   Final    NONE Performed at [facility], [place] [address] [place], [place] [address]    Culture   Final    NO GROWTH Performed at [facility], [place] [address], [place], [place] [address]    Report Status 04/09/2018 FINAL  Final  Blood Cultures (routine x 2)     Status: None (Preliminary result)   Collection Time: 04/07/18  9:39 PM  Result Value Ref Range Status   Specimen Description   Final    BLOOD RIGHT ANTECUBITAL Performed at [facility], [place] [address], [place], [place] [address]    Special Requests   Final    BOTTLES DRAWN AEROBIC AND ANAEROBIC Blood Culture adequate volume Performed at [facility] [address] [place], [place] [address]    Culture   Final    NO GROWTH 2 DAYS Performed at [facility] [address] [place], [place] [address]    Report Status PENDING  Incomplete  Blood Cultures (routine x 2)     Status: None (Preliminary result)   Collection Time: 04/07/18  9:39 PM  Result Value Ref Range  Status   Specimen Description   Final    BLOOD LEFT ANTECUBITAL Performed at [facility] [address], [place], [place] [address]    Special Requests   Final    AEROBIC BOTTLE ONLY Blood Culture adequate volume Performed at [facility] [address], [place], [place] [address]    Culture   Final    NO GROWTH 2 DAYS Performed at [facility] [address], [place], [place] [address]    Report Status PENDING  Incomplete  MRSA PCR Screening     Status: None   Collection Time: 04/08/18  9:11 AM  Result Value Ref Range Status   MRSA by PCR NEGATIVE NEGATIVE Final    Comment:        The GeneXpert MRSA Assay (FDA approved for NASAL specimens only), is one component of a comprehensive MRSA colonization surveillance program. It is not intended to diagnose MRSA infection nor to guide or monitor treatment for MRSA infections. Performed at [facility], [place] [address] [place], [place] [address]   C difficile quick scan w PCR reflex     Status: None   Collection Time: 04/08/18  4:28 PM  Result Value Ref Range Status   C Diff antigen NEGATIVE NEGATIVE Final   C Diff toxin NEGATIVE NEGATIVE Final   C Diff interpretation No C. difficile detected.  Final    Comment: Performed at [facility], [place] [address] [place], [place] [address]    Radiographs and labs were personally reviewed by me.   Bobby Rumpf, MD [facility] [phone] 04/09/2018, 1:29 PM

## 2018-04-09 NOTE — Procedures (Signed)
Paracentesis Procedure Note  Pre-operative Diagnosis: Ascites, pancreatic mass, sepsis  Post-operative Diagnosis: same  Indications: Assess for SBP.  Procedure Details  Consent: Informed consent was obtained. Risks of the procedure were discussed including: infection, bleeding, pain.  Under sterile conditions the patient was positioned. Betadine solution and sterile drapes were utilized.  1% plain lidocaine was used to anesthetize the right lower quadrant. Fluid was obtained without any difficulties and minimal blood loss.  A dressing was applied to the wound and wound care instructions were provided.   Findings 1200 ml of dark yellow/Hachey peritoneal fluid was obtained. A sample was sent for chemical analysis, cell count, culture, and cytology.   Condition: stable  Plan F/u test results  Attending Attestation: I performed the procedure.   Chesley Mires, MD [facility] Pulmonary/Critical Care 04/09/2018, 5:06 PM

## 2018-04-09 NOTE — Progress Notes (Signed)
[facility] ROUNDING NOTE   Subjective:   HIV followed by Dr. Tommy Medal treated with Symtuza. 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 At her last office visit she was felt to have good control of her disease. 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 Family reports she has been unwell for over one month, but is usually responsive. She was last spoken to by her sister 2 days ago when she was complaining of abdominal pain and diarrhea. Family is worried that her caretakers are not competent. She has supposedly not been eating or drinking for days. ED she was found to be hypotensive despite IVF resuscitation. Levophed started. Laboratory evaluation significant forcreatinine of 12.06 and BUN of 115.   She was started on CRRT 5/9 and appears to be tolerating this well     Urine Output  - minimal     Objective:  Vital signs in last 24 hours:  Temp:  [94.1 F (34.5 C)-98 F (36.7 C)] 94.3 F (34.6 C) (05/10 1000) Pulse Rate:  [56-72] 56 (05/10 0930) Resp:  [11-34] 11 (05/10 1000) BP: (86-127)/(57-98) 86/67 (05/10 1000) SpO2:  [86 %-100 %] 91 % (05/10 0930) Weight:  [133 lb 13.1 oz (60.7 kg)-151 lb 0.2 oz (68.5 kg)] 133 lb 13.1 oz (60.7 kg) (05/10 0400)  Weight change: 16 lb 0.2 oz (7.264 kg) Filed Weights   04/07/18 2155 04/08/18 1400 04/09/18 0400  Weight: 135 lb (61.2 kg) 151 lb 0.2 oz (68.5 kg) 133 lb 13.1 oz (60.7 kg)    Intake/Output: I/O last 3 completed shifts: In: 15431.2 [I.V.:9257.2; NATFT:7322; IV GURKYHCWC:3762] Out: 2354 [Urine:282; Other:2072]   Intake/Output this shift:  Total I/O In: 405 [I.V.:405] Out: 378 [Other:378]  Awake and oriented  CVS- RRR  No JVP noted  RS-  Diminished at base   ABD- BS present soft non-distended EXT- no edema   Basic Metabolic Panel: Recent Labs  Lab 04/07/18 2042 04/07/18 2139 04/08/18 1422 04/09/18 0515  NA  136 136 137 138  K 3.6 3.6 2.6* 3.0*  CL 100* 102 101 98*  CO2 13* 13* 18* 22  GLUCOSE 88 89 125* 108*  BUN 115* 113* 99* 48*  CREATININE 12.06* 12.10* 10.15* 4.91*  CALCIUM 7.9* 7.7* 6.4* 7.2*  MG  --   --   --  2.0  PHOS  --   --  7.6* 3.2    Liver Function Tests: Recent Labs  Lab 04/07/18 2139 04/08/18 1422 04/09/18 0515  AST 111*  --  104*  ALT 45  --  38  ALKPHOS 764*  --  675*  BILITOT 48.9*  --  49.9*  PROT 5.9*  --  5.7*  ALBUMIN 1.8* 1.7* 1.8*  1.8*   No results for input(s): LIPASE, AMYLASE in the last 168 hours. Recent Labs  Lab 04/07/18 2139 04/09/18 0404  AMMONIA 67* 62*    CBC: Recent Labs  Lab 04/07/18 2042 04/07/18 2139 04/08/18 2217 04/09/18 0515  WBC 22.4* 23.4*  --  21.9*  NEUTROABS  --  20.7*  --   --   HGB 6.3* 6.1* 9.9* 9.9*  HCT 18.9* 18.3* 28.1* 28.1*  MCV 100.0 100.0  --  89.2  PLT 355 323  --  166    Cardiac Enzymes: Recent Labs  Lab 04/07/18 2042 04/07/18 2218 04/09/18 0515  CKTOTAL 643*  --  618*  TROPONINI  --  0.39* 0.25*    BNP: Invalid  input(s): POCBNP  CBG: Recent Labs  Lab 04/08/18 1211 04/08/18 18-Mar-2003 04/09/18 0011 04/09/18 0436 04/09/18 0744  GLUCAP 127* 101* 99 92 97    Microbiology: Results for orders placed or performed during the hospital encounter of 04/07/18  Urine culture     Status: None   Collection Time: 04/07/18  8:42 PM  Result Value Ref Range Status   Specimen Description   Final    URINE, CATHETERIZED Performed at [facility], [place] [address], [place], [place] [address]    Special Requests   Final    NONE Performed at [facility], [place] [address], [place], [place] [address]    Culture   Final    NO GROWTH Performed at [facility], [place] [address] [place], [place] [address]    Report Status 04/09/2018 FINAL  Final  Blood Cultures (routine x 2)     Status: None (Preliminary result)   Collection Time: 04/07/18  9:39 PM  Result Value Ref  Range Status   Specimen Description   Final    BLOOD RIGHT ANTECUBITAL Performed at [facility] [address], [place], [place] [address]    Special Requests   Final    BOTTLES DRAWN AEROBIC AND ANAEROBIC Blood Culture adequate volume Performed at [facility] [address] [place], [place] [address]    Culture   Final    NO GROWTH 2 DAYS Performed at [facility] [address], [place], [place] [address]    Report Status PENDING  Incomplete  Blood Cultures (routine x 2)     Status: None (Preliminary result)   Collection Time: 04/07/18  9:39 PM  Result Value Ref Range Status   Specimen Description   Final    BLOOD LEFT ANTECUBITAL Performed at [facility] [address] [place], [place] [address]    Special Requests   Final    AEROBIC BOTTLE ONLY Blood Culture adequate volume Performed at [facility] [address] [place], [place] [address]    Culture   Final    NO GROWTH 2 DAYS Performed at [facility] [address], [place], [place] [address]    Report Status PENDING  Incomplete  MRSA PCR Screening     Status: None   Collection Time: 04/08/18  9:11 AM  Result Value Ref Range Status   MRSA by PCR NEGATIVE NEGATIVE Final    Comment:        The GeneXpert MRSA Assay (FDA approved for NASAL specimens only), is one component of a comprehensive MRSA colonization surveillance program. It is not intended to diagnose MRSA infection nor to guide or monitor treatment for MRSA infections. Performed at [facility], [place] [address], [place], [place] [address]   C difficile quick scan w PCR reflex     Status: None   Collection Time: 04/08/18  4:28 PM  Result Value Ref Range Status   C Diff antigen NEGATIVE NEGATIVE Final   C Diff toxin NEGATIVE NEGATIVE Final   C Diff interpretation No C. difficile detected.  Final    Comment: Performed at [facility], [place] [address] [place], [place] [address]    Coagulation Studies: Recent Labs    04/07/18 2138-03-17 04/09/18 0515  LABPROT 19.3* 19.1*  INR 1.65 1.62    Urinalysis: Recent Labs    04/07/18 2041/03/17 04/08/18 1031  [place] 1.025  TEST NOT REPORTED DUE TO COLOR INTERFERENCE OF URINE PIGMENT  PHURINE 6.5 TEST NOT REPORTED DUE TO COLOR INTERFERENCE OF URINE PIGMENT  GLUCOSEU 100* TEST NOT REPORTED DUE TO COLOR INTERFERENCE OF URINE PIGMENT*  HGBUR TRACE* TEST NOT REPORTED DUE TO COLOR INTERFERENCE OF URINE PIGMENT*  BILIRUBINUR LARGE* TEST NOT REPORTED DUE TO COLOR INTERFERENCE OF URINE PIGMENT*  KETONESUR TRACE* TEST NOT REPORTED DUE TO COLOR INTERFERENCE OF URINE PIGMENT*  PROTEINUR 100* TEST NOT REPORTED DUE TO COLOR INTERFERENCE OF URINE PIGMENT*  NITRITE POSITIVE* TEST NOT REPORTED DUE TO COLOR INTERFERENCE OF URINE PIGMENT*  LEUKOCYTESUR NEGATIVE TEST NOT REPORTED DUE TO COLOR INTERFERENCE OF URINE PIGMENT*      Imaging: Ct Abdomen Pelvis Wo Contrast  Result Date: 04/08/2018 CLINICAL DATA:  Failure to thrive the past 4 days. Jaundice and incontinence. Leukocytosis. EXAM: CT ABDOMEN AND PELVIS WITHOUT CONTRAST TECHNIQUE: Multidetector CT imaging of the abdomen and pelvis was performed following the standard protocol without IV contrast. COMPARISON:  05/09/2012 CT FINDINGS: Lower chest: Tip of central line catheter is seen in the proximal right atrium. Mild cardiomegaly without pericardial effusion. Bilateral small pleural effusions with atelectasis at each lung base. 6 mm nodular density in the right lower lobe adjacent to the major fissure, series 2/2. Hepatobiliary: Surface nodularity morphologically consistent with cirrhosis. Left hepatic lobe hypodense mass measuring 4.8 x 4.7 x 3.7 cm is noted, new since prior exam and concerning for hepatocellular carcinoma or potentially metastatic disease. The gallbladder is distended and contains intraluminal hyperdense noncalcified  abnormalities. This appears to extend into the common duct and may be contributing to the patient's jaundice. Hyperdense biliary sludge, blood products or intraluminal gallbladder mass might account for this appearance. Pancreas: The pancreas appears atrophic without ductal dilatation. Spleen: No splenomegaly. Adrenals/Urinary Tract: No adrenal mass. There is left adrenal limb thickening. Kidneys are visualized without nephrolithiasis nor obstructive uropathy. No discrete renal mass is identified. The urinary bladder is decompressed and somewhat thickened in appearance as a result. Stomach/Bowel: Physiologic distention of the stomach. No small bowel dilatation. No bowel obstruction. Sympathetic thickening of the intestine likely from surrounding ascites and edema. Circumferential rectal wall thickening possibly representing internal hemorrhoids. Vascular/Lymphatic: Aortoiliac atherosclerosis without aneurysm. Lymphadenopathy is difficult to assess given paucity intra-abdominal fat and lack of IV contrast. Reproductive: Calcified uterine fibroids are noted. No adnexal mass is seen. Other: Moderate volume of ascites within the abdomen pelvis. Mild soft tissue anasarca. Musculoskeletal: No aggressive osseous lesions. Degenerative disc disease L5-S1. IMPRESSION: 1. Cardiomegaly with small bilateral pleural effusions. 6 mm nodular density in the right lower lobe adjacent to the major fissure. Non-contrast chest CT at 6-12 months is recommended. If the nodule is stable at time of repeat CT, then future CT at 18-24 months (from today's scan) is considered optional for low-risk patients, but is recommended for high-risk patients. This recommendation follows the consensus statement: Guidelines for Management of Incidental Pulmonary Nodules Detected on CT Images: From the Fleischner Society 2017; Radiology 2017; 284:228-243. 2. Morphologic appearance of cirrhosis with moderate ascites. A hypodense mass in the left hepatic  lobe measuring 4.8 x 4.7 x 3.7 cm is new since prior comparison and concerning for neoplasm. 3. Gallbladder distention with hyperdense intraluminal masslike abnormalities noted within either representing tumefactive biliary sludge blood products or potentially intraluminal mass. The hyperdensity extends into what appears to be the common duct may be contributing to the patient's reported jaundice. 4. Nonobstructed, nondistended bowel. Circumferential thickening in the rectum may represent internal hemorrhoids. 5. Calcified  uterine fibroids. 6. Moderate-to-marked aortoiliac atherosclerosis. Electronically Signed   By: [facility] Royalty M.D.   On: 04/08/2018 01:39   Dg Chest 1 View  Result Date: 04/07/2018 CLINICAL DATA:  Weakness.  HIV. EXAM: CHEST  1 VIEW COMPARISON:  01/26/2018. FINDINGS: AP portable supine radiograph demonstrates elevated RIGHT hemidiaphragm. Perihilar opacity is new/increased from priors, extending into the RIGHT-sided retrocardiac region, suspicious for RIGHT lower lobe pneumonia. Streaky LEFT basilar density probably atelectasis. No osseous findings of significance. Previous cervical fusion. Normal heart size. IMPRESSION: Worsening aeration from priors. Query RIGHT perihilar pneumonia with volume loss. Electronically Signed   By: Staci Righter M.D.   On: 04/07/2018 22:49   Ct Head Wo Contrast  Result Date: 04/08/2018 CLINICAL DATA:  Altered level of consciousness EXAM: CT HEAD WITHOUT CONTRAST TECHNIQUE: Contiguous axial images were obtained from the base of the skull through the vertex without intravenous contrast. COMPARISON:  04/11/2014 FINDINGS: Brain: Mild age related involutional changes of the brain. Minimal small vessel ischemic disease of periventricular white matter. No large vascular territory infarct hemorrhage or midline shift. No intra-axial mass nor extra-axial fluid collections. Vascular: No hyperdense vessel sign. Skull: No skull fracture. Sinuses/Orbits: Clear paranasal  sinuses and mastoids. Intact orbits and globes. Other: None IMPRESSION: Chronic minimal small vessel ischemic disease of periventricular white matter. No acute intracranial abnormality. Electronically Signed   By: [facility] Royalty M.D.   On: 04/08/2018 01:22   US Renal  Result Date: 04/08/2018 CLINICAL DATA:  67 year old female with a history of acute renal disease EXAM: RENAL / URINARY TRACT ULTRASOUND COMPLETE COMPARISON:  None. FINDINGS: Right Kidney: Length: 13.3 cm. Echogenicity within normal limits. No mass or hydronephrosis visualized. Left Kidney: Length: 12.2 cm. Echogenicity within normal limits. No mass or hydronephrosis visualized. Additional: Multiple mixed echogenicity liver masses are present. Ascites. Bladder: Appears normal for degree of bladder distention. IMPRESSION: No evidence of hydronephrosis. Multiple liver masses, compatible with metastatic disease. Ascites. These results were called by telephone at the time of interpretation on 04/08/2018 at 1:37 pm to Marni Griffon who verbally acknowledged these results. Electronically Signed   By: Corrie Mckusick D.O.   On: 04/08/2018 13:39   Dg Chest Port 1 View  Result Date: 04/08/2018 CLINICAL DATA:  Central line placement EXAM: PORTABLE CHEST 1 VIEW COMPARISON:  04/07/2018 FINDINGS: Right IJ approach central line catheter is seen in the proximal right atrium. No pneumothorax. Heart size is normal. There is mild ectasia of the thoracic aorta. Slight improvement in aeration about the perihilar airspace opacity on the right. Bibasilar atelectasis is noted, left greater than right. ACDF of the included mid and lower cervical spine. IMPRESSION: New right IJ central line catheter is seen in the proximal right atrium without pneumothorax. Slightly improved aeration with respect infrahilar pulmonary consolidation noted previously. Electronically Signed   By: [facility] Royalty M.D.   On: 04/08/2018 01:21     Medications:   . sodium chloride    . sodium chloride  10 mL/hr at 04/09/18 1000  . sodium chloride    . sodium chloride    . famotidine (PEPCID) IV Stopped (04/08/18 2326)  . fluconazole (DIFLUCAN) IV Stopped (04/08/18 1540)  . piperacillin-tazobactam (ZOSYN)  IV Stopped (04/09/18 5027)  . dialysis replacement fluid (prismasate) 200 mL/hr at 04/08/18 1649  . dialysis replacement fluid (prismasate) 300 mL/hr at 04/09/18 0942  . dialysate (PRISMASATE) 2,000 mL/hr at 04/09/18 0809  .  sodium bicarbonate  infusion 1000 mL 125 mL/hr at 04/09/18 1000  . sodium chloride  999 mL/hr at 04/08/18 1654   . chlorhexidine  15 mL Mouth Rinse BID  . Chlorhexidine Gluconate Cloth  6 each Topical Q0600  . Gerhardt's butt cream   Topical BID  . insulin aspart  1-3 Units Subcutaneous Q4H  . mouth rinse  15 mL Mouth Rinse q12n4p  . [START ON 04/10/2018] pneumococcal 23 valent vaccine  0.5 mL Intramuscular Tomorrow-1000  . 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

## 2018-04-09 NOTE — Progress Notes (Signed)
[facility] Progress Note  [name] 67 y.o. [birth year]  CC:  Jaundice, anemia, abnormal CT   Subjective: patient remains lethargic. Currently getting CRRT. No acute events overnight.  ROS : not able to obtain.   Objective: Vital signs in last 24 hours: Vitals:   04/09/18 0930 04/09/18 1000  BP: 90/66 (!) 86/67  Pulse: (!) 56   Resp: 12 11  Temp:  (!) 94.3 F (34.6 C)  SpO2: 91%     Physical Exam:  Constitutional: confused, lethargic Eyes: EOM are normal. Scleral icterus is present.  Cardiovascular: Normal rate. Murmur heard. Pulmonary/Chest:  Decreased breath sounds bilaterally. No acute respiratory distress  Abdominal:  Abdomen is distended. Generalized discomfort on palpation. No rebound or guarding. Bowel sounds hypoactive.  Confused. Lethargic     Lab Results: Recent Labs    04/08/18 1422 04/09/18 0515  NA 137 138  K 2.6* 3.0*  CL 101 98*  CO2 18* 22  GLUCOSE 125* 108*  BUN 99* 48*  CREATININE 10.15* 4.91*  CALCIUM 6.4* 7.2*  MG  --  2.0  PHOS 7.6* 3.2   Recent Labs    04/07/18 2139 04/08/18 1422 04/09/18 0515  AST 111*  --  104*  ALT 45  --  38  ALKPHOS 764*  --  675*  BILITOT 48.9*  --  49.9*  PROT 5.9*  --  5.7*  ALBUMIN 1.8* 1.7* 1.8*  1.8*   Recent Labs    04/07/18 2139 04/08/18 2217 04/09/18 0515  WBC 23.4*  --  21.9*  NEUTROABS 20.7*  --   --   HGB 6.1* 9.9* 9.9*  HCT 18.3* 28.1* 28.1*  MCV 100.0  --  89.2  PLT 323  --  166   Recent Labs    04/07/18 2139 04/09/18 0515  LABPROT 19.3* 19.1*  INR 1.65 1.62      Assessment/Plan: - significant jaundice with total bilirubin of 48.9, alkaline phosphatase 764. Minimal elevation of AST at 111 and ALT at 45. INR 1.65. CT scan concerning for liver lesion as well as gallbladder lesion extending to the common bile duct. - confusion/altered mental status. ?? hepatic encephalopathy. - Acute kidney injury.currently getting CRRT. - Cirrhosis with ascites .  -  anemia. No overt GI bleed. - Diarrhea. Non bloody. C. Difficile negative - septic Shock  - HIV   recommendations ------------------------ -  C diff negative. GI pathogen panel pending  - MRCP without contrast to rule out biliary obstruction pending  - AFP normal. CA 19-9 pending  - follow hepatitis panel.  - Recommend diagnostic paracentesis with fluid analysis . Need to rule out SBP given leukocytosis. - she may need a liver biopsy depending on MRI MRCP findings. - rifaximin for possible hepatic encephalopathy. Avoid lactulose in setting of diarrhea. - Monitor CBC, CMP and INR. - GI will follow.      Otis Brace MD, [place] 04/09/2018, 10:13 AM  Contact #  [phone]

## 2018-04-09 NOTE — Progress Notes (Signed)
Initial Nutrition Assessment  DOCUMENTATION CODES:   Not applicable  INTERVENTION:    When diet advanced, recommend adding Ensure Enlive po TID, each supplement provides 350 kcal and 20 grams of protein  NUTRITION DIAGNOSIS:   Inadequate oral intake related to inability to eat as evidenced by NPO status.  GOAL:   Patient will meet greater than or equal to 90% of their needs  MONITOR:   Diet advancement, PO intake, Skin, Labs, I & O's  REASON FOR ASSESSMENT:   Low Braden    ASSESSMENT:   67 yo female with PMH of COPD, arthritis, lipoma, HIV, thyroid cancer s/p total thyroidectomy, asthma, and neuropathy who was admitted on 5/8 with septic shock, AIDS candidiasis, hepatic mass, and ATN.   Discussed patient in ICU rounds and with RN today. Unable to speak with patient or complete a nutrition focused physical exam at this time.  She has been on CRRT since 5/9.  Labs reviewed. Potassium 3 (L), phosphorus 3.2 (WNL) CBG's: 74-12-87 Medications reviewed and include Novolog and KCl.  NUTRITION - FOCUSED PHYSICAL EXAM:  unable to complete at this time  Diet Order:   Diet Order           Diet NPO time specified  Diet effective now          EDUCATION NEEDS:   No education needs have been identified at this time  Skin:  Skin Assessment: Skin Integrity Issues: Skin Integrity Issues:: Stage II Stage II: buttocks  Last BM:  5/10  Height:   Ht Readings from Last 1 Encounters:  04/09/18 5\' 8"  (1.727 m)    Weight:   Wt Readings from Last 1 Encounters:  04/09/18 133 lb 13.1 oz (60.7 kg)    Ideal Body Weight:  63.6 kg  BMI:  Body mass index is 20.35 kg/m.  Estimated Nutritional Needs:   Kcal:  1800-2000  Protein:  100-120 gm  Fluid:  1.6 L    Molli Barrows, RD, LDN, CNSC Pager [phone] After Hours Pager [phone]

## 2018-04-10 ENCOUNTER — Other Ambulatory Visit: Payer: Self-pay

## 2018-04-10 DIAGNOSIS — L8990 Pressure ulcer of unspecified site, unspecified stage: Secondary | ICD-10-CM

## 2018-04-10 LAB — GLUCOSE, CAPILLARY
GLUCOSE-CAPILLARY: 128 mg/dL — AB (ref 65–99)
Glucose-Capillary: 105 mg/dL — ABNORMAL HIGH (ref 65–99)
Glucose-Capillary: 115 mg/dL — ABNORMAL HIGH (ref 65–99)

## 2018-04-10 LAB — RENAL FUNCTION PANEL
ANION GAP: 10 (ref 5–15)
Albumin: 1.4 g/dL — ABNORMAL LOW (ref 3.5–5.0)
BUN: 22 mg/dL — ABNORMAL HIGH (ref 6–20)
CALCIUM: 7 mg/dL — AB (ref 8.9–10.3)
CHLORIDE: 98 mmol/L — AB (ref 101–111)
CO2: 30 mmol/L (ref 22–32)
CREATININE: 2.57 mg/dL — AB (ref 0.44–1.00)
GFR calc Af Amer: 21 mL/min — ABNORMAL LOW (ref >60)
GFR, EST NON AFRICAN AMERICAN: 18 mL/min — AB (ref >60)
Glucose, Bld: 115 mg/dL — ABNORMAL HIGH (ref 65–99)
Phosphorus: 1.9 mg/dL — ABNORMAL LOW (ref 2.5–4.6)
Potassium: 3.4 mmol/L — ABNORMAL LOW (ref 3.5–5.1)
SODIUM: 138 mmol/L (ref 135–145)

## 2018-04-10 LAB — HEPATITIS PANEL, ACUTE
HEP A IGM: NEGATIVE
HEP B C IGM: NEGATIVE
Hepatitis B Surface Ag: NEGATIVE

## 2018-04-10 LAB — MAGNESIUM: MAGNESIUM: 2.1 mg/dL (ref 1.7–2.4)

## 2018-04-10 LAB — LEGIONELLA PNEUMOPHILA SEROGP 1 UR AG: L. pneumophila Serogp 1 Ur Ag: NEGATIVE

## 2018-04-10 MED ORDER — POTASSIUM PHOSPHATES 15 MMOLE/5ML IV SOLN
10.0000 mmol | Freq: Once | INTRAVENOUS | Status: AC
  Administered 2018-04-10: 10 mmol via INTRAVENOUS
  Filled 2018-04-10: qty 3.33

## 2018-04-10 MED ORDER — WHITE PETROLATUM EX OINT
TOPICAL_OINTMENT | CUTANEOUS | Status: AC
Start: 2018-04-10 — End: 2018-04-10
  Administered 2018-04-10: 0.2
  Filled 2018-04-10: qty 28.35

## 2018-04-10 MED ORDER — SODIUM CHLORIDE 0.9 % IV SOLN
3.0000 g | Freq: Two times a day (BID) | INTRAVENOUS | Status: DC
  Administered 2018-04-10 – 2018-04-12 (×4): 3 g via INTRAVENOUS
  Filled 2018-04-10 (×5): qty 3

## 2018-04-10 MED ORDER — FLUCONAZOLE IN SODIUM CHLORIDE 200-0.9 MG/100ML-% IV SOLN
200.0000 mg | INTRAVENOUS | Status: DC
  Administered 2018-04-10 – 2018-04-11 (×2): 200 mg via INTRAVENOUS
  Filled 2018-04-10 (×3): qty 100

## 2018-04-10 NOTE — Progress Notes (Signed)
PULMONARY / CRITICAL CARE MEDICINE   Name: [name] MRN:   [id number] DOB:   May 26, 1951           ADMISSION DATE:  04/07/2018 CONSULTATION DATE:  5/9  REFERRING MD:  Dr. Valere Dross EDP  CHIEF COMPLAINT:  Shock  BRIEF 67 year old female with PMH as below, which is significant for HIV followed by Dr. Tommy Medal treated with Symtuza. 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 At her last office visit she was felt to have good control of her disease, but this is back in 2017, her current CD 4 count is 200 w/ exam c/w AIDS at this point. 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   Admitted 5/8 She presented to ED 5/8 for "failure to thrive". Family reports she has been unwell for over one month, but is usually responsive. She was last spoken to by her sister 2 days prior when she was complaining of abdominal pain and diarrhea. Family is worried that her caretakers are not competent. She had supposedly not been eating or drinking for days.   Upon arrival to the ED she was found to be hypotensive despite IVF resuscitation. Levophed started. Laboratory evaluation significant for creatinine of 12.06 and BUN of 115. Patient has leukocytosis of 22.4 with left shift. AST 111, ALT 45, alkaline phosphatase 764, and total bili 48.4.  PT/INR 19.3/1.65. Initial troponin elevated at 0.39. Hemoglobin 6.1. She was transfused. CT head wnl, CT abdomen done as well. Pending. She was started on ABX for presumed sepsis. PCCM asked to admit.   EVENTS CTabd/pelvis 5/8: Hepatobiliary: Surface nodularity morphologically consistent with cirrhosis. Left hepatic lobe hypodense mass measuring 4.8 x 4.7 x 3.7 cm is noted, new since prior exam and concerning for hepatocellular carcinoma or potentially metastatic disease. The gallbladder is distended and contains intraluminal hyperdense noncalcified abnormalities.  This appears to extend into the common duct and may be contributing to the patient's jaundice. Hyperdense biliary sludge, blood products or intraluminal gallbladder mass might account for this appearance.The urinary bladder is decompressed and somewhat thickened in appearance as a result. Stomach/Bowel: Physiologic distention of the stomach. No small bowel dilatation. No bowel obstruction. Sympathetic thickening of the intestine likely from surrounding ascites and edema. Circumferential rectal wall thickening possibly representing internal hemorrhoids. Vascular/Lymphatic: Aortoiliac atherosclerosis without aneurysm. Lymphadenopathy is difficult to assess given paucity intra-abdominal fat and lack of IV contrast. abd [place] 5/9: No evidence of hydronephrosis. Multiple liver masses, compatible with metastatic disease Ascites ECHO 5/9>>>EF 65 to 70%.  LV size normal.  No regional wall abnormality. Grade 2 diastolic heart failure.  Right ventricle slightly dilated systolic function of the right ventricle moderately reduced.  Right atrium moderately dilated.  There is moderate tricuspid regurgitation.  Peak systolic pressure estimated at 37 mmHg  Cultures BCX2 5/9>>> UC 5/9>>> negative Urine legionella 5/9>>> negative  ABX vanc x 1 5/8 Zosyn 5/9>>> Fluconazole 5/9>>>  Lines/tubes Right IJ 5/9>>> start CRRT Right fem HD cath 5/9>>>    5/10 = CRRT initiated last night Remains hemodynamically stable Culture data is negative to date Renal function improving on CRRT  5/10 -paracentesis    SUBJECTIVE/OVERNIGHT/INTERVAL HX  04/10/18 - Issues ar renal failure, liver and pancreatic mass on MRI with ascites (s/p paracentesis 5/9  and AIDS with thrush, failure to thrive, encephalopathy, ATN on CRRT . Renal stopping CRRT to see if tehre will be renal recovery. Some oozing from  paracentesis site per RN.Not opn pressors. Not on vent. AWaiting paracentesis results   Objective BP 102/63    Pulse 73   Temp 98.4 F (36.9 C) (Bladder)   Resp 17   Ht 5' 8"  (1.727 m)   Wt 61.5 kg (135 lb 9.3 oz)   SpO2 100%   BMI 20.62 kg/m  CVP:  [6 mmHg-7 mmHg] 6 mmHg   Intake/Output Summary (Last 24 hours) at 04/10/2018 1034 Last data filed at 04/10/2018 0700 Gross per 24 hour  Intake 3585 ml  Output 3932 ml  Net -347 ml    Physical exam  General Appearance:    Looks chronic criticall ill OBESE - NO but is very cachectic with muscle wasting  Head:    Normocephalic, without obvious abnormality, atraumatic  Eyes:    PERRL - yes, conjunctiva/corneas - JAUNDICE +      Ears:    Normal external ear canals, both ears  Nose:   NG tube - no  Throat:  ETT TUBE - no , OG tube - no  Neck:   Supple,  No enlargement/tenderness/nodules     Lungs:     Clear to auscultation bilaterally, V  Chest wall:    No deformity  Heart:    S1 and S2 normal, no murmur, CVP - mp.  Pressors - mp  Abdomen:     Soft, no masses, no organomegaly but non specifically tendder  Genitalia:    Not done  Rectal:   not done  Extremities:   Extremities- intact     Skin:   Intact in exposed areas .      Neurologic:   Sedation - none -> RASS - 0 to -1 equivalent. Moves all 4s - yes. CAM-ICU - positive for delirim . Orientation - not oriented      PULMONARY Recent Labs  Lab 04/07/18 2147  HCO3 11.9*  O2SAT 25.3    CBC Recent Labs  Lab 04/07/18 2042 04/07/18 2139 04/08/18 2217 04/09/18 0515  HGB 6.3* 6.1* 9.9* 9.9*  HCT 18.9* 18.3* 28.1* 28.1*  WBC 22.4* 23.4*  --  21.9*  PLT 355 323  --  166    COAGULATION Recent Labs  Lab 04/07/18 2139 04/09/18 0515  INR 1.65 1.62    CARDIAC   Recent Labs  Lab 04/07/18 2218 04/09/18 0515  TROPONINI 0.39* 0.25*   No results for input(s): PROBNP in the last 168 hours.   CHEMISTRY Recent Labs  Lab 04/07/18 2139 04/08/18 1422 04/09/18 0515 04/09/18 2142 04/10/18 0504  NA 136 137 138 136 138  K 3.6 2.6* 3.0* 4.1 3.4*  CL 102 101 98* 97* 98*  CO2  13* 18* 22 29 30   GLUCOSE 89 125* 108* 121* 115*  BUN 113* 99* 48* 29* 22*  CREATININE 12.10* 10.15* 4.91* 3.20* 2.57*  CALCIUM 7.7* 6.4* 7.2* 7.0* 7.0*  MG  --   --  2.0  --  2.1  PHOS  --  7.6* 3.2 2.1* 1.9*   Estimated Creatinine Clearance: 21.2 mL/min (A) (by C-G formula based on SCr of 2.57 mg/dL (H)).   LIVER Recent Labs  Lab 04/07/18 2139 04/08/18 1422 04/09/18 0515 04/09/18 2142 04/10/18 0504  AST 111*  --  104*  --   --   ALT 45  --  38  --   --   ALKPHOS 764*  --  675*  --   --   BILITOT 48.9*  --  49.9*  --   --  PROT 5.9*  --  5.7*  --   --   ALBUMIN 1.8* 1.7* 1.8*  1.8* 1.5* 1.4*  INR 1.65  --  1.62  --   --      INFECTIOUS Recent Labs  Lab 04/07/18 2121 04/07/18 2351 04/09/18 0515  LATICACIDVEN 1.22 0.74  --   PROCALCITON  --   --  4.96     ENDOCRINE CBG (last 3)  Recent Labs    04/09/18 1954 04/09/18 2318 04/10/18 0336  GLUCAP 89 104* 105*         IMAGING x48h  - image(s) personally visualized  -   highlighted in bold US Renal  Result Date: 04/08/2018 CLINICAL DATA:  67 year old female with a history of acute renal disease EXAM: RENAL / URINARY TRACT ULTRASOUND COMPLETE COMPARISON:  None. FINDINGS: Right Kidney: Length: 13.3 cm. Echogenicity within normal limits. No mass or hydronephrosis visualized. Left Kidney: Length: 12.2 cm. Echogenicity within normal limits. No mass or hydronephrosis visualized. Additional: Multiple mixed echogenicity liver masses are present. Ascites. Bladder: Appears normal for degree of bladder distention. IMPRESSION: No evidence of hydronephrosis. Multiple liver masses, compatible with metastatic disease. Ascites. These results were called by telephone at the time of interpretation on 04/08/2018 at 1:37 pm to Marni Griffon who verbally acknowledged these results. Electronically Signed   By: Corrie Mckusick D.O.   On: 04/08/2018 13:39   Mr Abdomen Mrcp Wo Contrast  Result Date: 04/09/2018 CLINICAL DATA:  Abnormal LFTs  EXAM: MRI ABDOMEN WITHOUT CONTRAST  (INCLUDING MRCP) TECHNIQUE: Multiplanar multisequence MR imaging of the abdomen was performed. Heavily T2-weighted images of the biliary and pancreatic ducts were obtained, and three-dimensional MRCP images were rendered by post processing. COMPARISON:  CT abdomen/pelvis dated 04/08/2017 FINDINGS: Severely motion degraded images. Lower chest: Small bilateral pleural effusions. Associated lower lobe opacities, likely atelectasis. Hepatobiliary: Numerous hepatic metastases, measuring up to 5.0 cm in segment 4A (series 4001/image 11) and 6.3 cm in segment 5 (series 4001/image 16). Distended gallbladder with cholelithiasis. Intrahepatic and extrahepatic ductal dilatation, with layering sludge/gallstones within the distal common duct (series 8001/image 21). Pancreas: Mass-like enlargement of the pancreatic tail, measuring 2.7 x 5.3 cm (series 4001/image 17). Spleen:  Within normal limits. Adrenals/Urinary Tract:  Adrenal glands are grossly unremarkable. Kidneys are within normal limits.  No hydronephrosis. Stomach/Bowel: 2.4 cm cystic lesion along the gastric cardia (series 4001/image 20). Stomach is otherwise unremarkable. Visualized bowel is grossly unremarkable. Vascular/Lymphatic:  No evidence of abdominal aortic aneurysm. 1.6 cm short axis portacaval node (series 8001/image 20). Other:  Moderate abdominal ascites. Suspected peritoneal disease/omental caking beneath the left mid anterior abdominal wall (series 8001/image 25), although poorly evaluated. Musculoskeletal: Heterogeneous marrow signal in the visualized thoracolumbar spine, metastases not excluded. IMPRESSION: Limited evaluation due to severe motion degradation and lack of intravenous contrast administration. Mass-like enlargement of the pancreatic tail, measuring 2.7 x 5.3 cm, worrisome for primary pancreatic neoplasm given additional findings. 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 Moderate  abdominal ascites. Suspected peritoneal disease/omental caking beneath the left mid anterior abdominal wall. 1.6 cm short axis portacaval node. Distended gallbladder with cholelithiasis. Intrahepatic and extrahepatic ductal dilatation with layering sludge/stones in the common duct. Electronically Signed   By: Julian Hy M.D.   On: 04/09/2018 12:26   Mr 3d Recon At Scanner  Result Date: 04/09/2018 CLINICAL DATA:  Abnormal LFTs EXAM: MRI ABDOMEN WITHOUT CONTRAST  (INCLUDING MRCP) TECHNIQUE: Multiplanar multisequence MR imaging of the abdomen was performed. Heavily T2-weighted images of  the biliary and pancreatic ducts were obtained, and three-dimensional MRCP images were rendered by post processing. COMPARISON:  CT abdomen/pelvis dated 04/08/2017 FINDINGS: Severely motion degraded images. Lower chest: Small bilateral pleural effusions. Associated lower lobe opacities, likely atelectasis. Hepatobiliary: Numerous hepatic metastases, measuring up to 5.0 cm in segment 4A (series 4001/image 11) and 6.3 cm in segment 5 (series 4001/image 16). Distended gallbladder with cholelithiasis. Intrahepatic and extrahepatic ductal dilatation, with layering sludge/gallstones within the distal common duct (series 8001/image 21). Pancreas: Mass-like enlargement of the pancreatic tail, measuring 2.7 x 5.3 cm (series 4001/image 17). Spleen:  Within normal limits. Adrenals/Urinary Tract:  Adrenal glands are grossly unremarkable. Kidneys are within normal limits.  No hydronephrosis. Stomach/Bowel: 2.4 cm cystic lesion along the gastric cardia (series 4001/image 20). Stomach is otherwise unremarkable. Visualized bowel is grossly unremarkable. Vascular/Lymphatic:  No evidence of abdominal aortic aneurysm. 1.6 cm short axis portacaval node (series 8001/image 20). Other:  Moderate abdominal ascites. Suspected peritoneal disease/omental caking beneath the left mid anterior abdominal wall (series 8001/image 25), although poorly  evaluated. Musculoskeletal: Heterogeneous marrow signal in the visualized thoracolumbar spine, metastases not excluded. IMPRESSION: Limited evaluation due to severe motion degradation and lack of intravenous contrast administration. Mass-like enlargement of the pancreatic tail, measuring 2.7 x 5.3 cm, worrisome for primary pancreatic neoplasm given additional findings. 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 Moderate abdominal ascites. Suspected peritoneal disease/omental caking beneath the left mid anterior abdominal wall. 1.6 cm short axis portacaval node. Distended gallbladder with cholelithiasis. Intrahepatic and extrahepatic ductal dilatation with layering sludge/stones in the common duct. Electronically Signed   By: Julian Hy M.D.   On: 04/09/2018 12:26   Dg Chest Port 1 View  Result Date: 04/09/2018 CLINICAL DATA:  Pneumonia. EXAM: PORTABLE CHEST 1 VIEW COMPARISON:  04/08/2018 FINDINGS: The patient is mildly rotated to the right. Right jugular catheter terminates over the lower SVC. The cardiomediastinal silhouette is unchanged allowing for patient rotation. The lungs are hypoinflated. Right infrahilar airspace opacity is partially obscured by superimposition of the heart due to patient rotation but has not grossly worsened. There is mild patchy and linear opacity in the left lung base which is stable to slightly increased. Mild hazy perihilar opacity is present bilaterally, and the interstitial markings are mildly accentuated diffusely. There may be tiny bilateral pleural effusions. No pneumothorax is identified. IMPRESSION: 1. Low lung volumes with patchy bibasilar opacities which are stable to slightly worsened and which may reflect pneumonia or atelectasis. 2. Hazy perihilar opacities and interstitial prominence may reflect mild edema with tiny pleural effusions. Electronically Signed   By: Logan Bores M.D.   On: 04/09/2018 11:36     Impression/plan  Septic  shock complicated by hypovolemia.  Source of infection likely GI/rule out biliary obstruction also consider spontaneous bacterial peritonitis   5.11./19 - off pressors  Plan MAP goal > 60 with SBP > 90   Possible non-ST elevation MI with newly identified systolic and diastolic heart murmur   Recent Labs  Lab 04/07/18 2218 04/09/18 0515  TROPONINI 0.39* 0.25*    EF 65 to 70%.  LV size normal.  No regional wall abnormality. Grade 2 diastolic heart failure.  Right ventricle slightly dilated systolic function of the right ventricle moderately reduced.  Right atrium moderately dilated.  There is moderate tricuspid regurgitation.  Peak systolic pressure estimated at 37 mmHg 04/10/18 - NSTEMI profilelikely stress  Plan Dc tele when appropriate Not a candidate for further cardiac work-up at this point  appears to have significant thrush I suspect she has esophageal candidiasis Plan Day #3 fluconazole - ID managiung  Multiple hepatic masses as well as masslike appearance in gallbladder and common bile duct. -0 seeon on MRI as well -Total bilirubin, alk phos, and AST elevated -Appreciate GI consultation. - s/p paracentesis 04/09/18  Plan Per GI Await paracentesis results 04/09/18 Might need liver biopsy - per GI Continue rifaximin for possible hepatic encephalopathy   Renal failure with marked anion gap metabolic acidosis; felt hemodynamically mediated   - 5/11 - Off CRRT Plan  Per renal  Acute encephalopathy.  Suspect metabolic primarily, in the setting of uremia, acidosis and sepsis  04/10/18 - ongoing hypoactive delirum +  Plan Supportive care  Anemia, etiology unclear Recent Labs  Lab 04/07/18 2042 04/07/18 2139 04/08/18 2217 04/09/18 0515  HGB 6.3* 6.1* 9.9* 9.9*  HCT 18.9* 18.3* 28.1* 28.1*  WBC 22.4* 23.4*  --  21.9*  PLT 355 323  --  166   S/p 3 unit PRBC   Plan - PRBC for hgb </= 6.9gm%    - exceptions are   -  if ACS susepcted/confirmed then  transfuse for hgb </= 8.0gm%,  or    -  active bleeding with hemodynamic instability, then transfuse regardless of hemoglobin value   At at all times try to transfuse 1 unit prbc as possible with exception of active hemorrhage    Mild coagulopathy.  Suspect related to hepatic dysfunction as well as sepsis Recent Labs  Lab 04/07/18 2139 04/09/18 0515  INR 1.65 1.62    S/ P FFP Plan monitor  HIV/AIDS; absolute CD4 count now 200 Plan Per ID  Prior thyroidectomy Results for TAMMRA, PRESSMAN (MRN [id number]) as of 04/10/2018 10:39  Ref. Range 04/09/2018 04:05  TSH Latest Ref Range: 0.350 - 4.500 uIU/mL 15.913 (H)   Plan Monitor -> repeat and some point if apporpriate can consider synthroid  Chronic pain with bilateral avascular necrosis of the hips; head bound essentially at baseline Plan support  Pulmonary nodule Plan Follow-up bridging if she survives   DVT prophylaxis: scd SUP: na  Diet: NPO Activity: br Disposition : move to SDU/med-surg renal flooor  DNR status.  wiuth ongoing goals  b  CCM off and TRH primary from 04/11/18 d/w Dr Thereasa Solo   Dr. Brand Males, M.D., [facility].C.P Pulmonary and Critical Care Medicine Staff Physician, [place] Director - Interstitial Lung Disease  Program  [facility] [place], [place], [address]  Pager: [phone], If no answer or between  15:00h - 7:00h: call [phone] Telephone: [phone]

## 2018-04-10 NOTE — Progress Notes (Signed)
Subjective: Awake but not able to communicate.  Objective: Vital signs in last 24 hours: Temp:  [96.4 F (35.8 C)-98.4 F (36.9 C)] 98.4 F (36.9 C) (05/11 0400) Pulse Rate:  [64-80] 73 (05/11 0700) Resp:  [12-31] 17 (05/11 0700) BP: (89-107)/(56-76) 102/63 (05/11 0700) SpO2:  [94 %-100 %] 100 % (05/11 0700) Weight:  [61.5 kg (135 lb 9.3 oz)] 61.5 kg (135 lb 9.3 oz) (05/11 0400) Weight change: -7 kg (-15 lb 6.9 oz) Last BM Date: 04/09/18  PE: GEN:  Awake but not able to communicate, can't answer questions, confused ABD:  Protuberant with hepatomegaly and some distention HEENT:  Left nose bleed; scleral icterus.  Lab Results: CBC    Component Value Date/Time   WBC 21.9 (H) 04/09/2018 0515   RBC 3.15 (L) 04/09/2018 0515   HGB 9.9 (L) 04/09/2018 0515   HCT 28.1 (L) 04/09/2018 0515   PLT 166 04/09/2018 0515   MCV 89.2 04/09/2018 0515   MCH 31.4 04/09/2018 0515   MCHC 35.2 04/09/2018 0515   RDW 17.9 (H) 04/09/2018 0515   LYMPHSABS 1.6 04/07/2018 2139   MONOABS 0.7 04/07/2018 2139   EOSABS 0.2 04/07/2018 2139   BASOSABS 0.2 (H) 04/07/2018 2139   CMP     Component Value Date/Time   NA 138 04/10/2018 0504   K 3.4 (L) 04/10/2018 0504   CL 98 (L) 04/10/2018 0504   CO2 30 04/10/2018 0504   GLUCOSE 115 (H) 04/10/2018 0504   BUN 22 (H) 04/10/2018 0504   CREATININE 2.57 (H) 04/10/2018 0504   CREATININE 1.62 (H) 09/10/2017 0952   CALCIUM 7.0 (L) 04/10/2018 0504   PROT 5.7 (L) 04/09/2018 0515   ALBUMIN 1.4 (L) 04/10/2018 0504   AST 104 (H) 04/09/2018 0515   ALT 38 04/09/2018 0515   ALKPHOS 675 (H) 04/09/2018 0515   BILITOT 49.9 (HH) 04/09/2018 0515   GFRNONAA 18 (L) 04/10/2018 0504   GFRNONAA 31 (L) 04/13/2017 1050   GFRAA 21 (L) 04/10/2018 0504   GFRAA 35 (L) 04/13/2017 1050    Studies/Results: US Renal  Result Date: 04/08/2018 CLINICAL DATA:  67 year old female with a history of acute renal disease EXAM: RENAL / URINARY TRACT ULTRASOUND COMPLETE COMPARISON:  None.  FINDINGS: Right Kidney: Length: 13.3 cm. Echogenicity within normal limits. No mass or hydronephrosis visualized. Left Kidney: Length: 12.2 cm. Echogenicity within normal limits. No mass or hydronephrosis visualized. Additional: Multiple mixed echogenicity liver masses are present. Ascites. Bladder: Appears normal for degree of bladder distention. IMPRESSION: No evidence of hydronephrosis. Multiple liver masses, compatible with metastatic disease. Ascites. These results were called by telephone at the time of interpretation on 04/08/2018 at 1:37 pm to Marni Griffon who verbally acknowledged these results. Electronically Signed   By: Corrie Mckusick D.O.   On: 04/08/2018 13:39   Mr Abdomen Mrcp Wo Contrast  Result Date: 04/09/2018 CLINICAL DATA:  Abnormal LFTs EXAM: MRI ABDOMEN WITHOUT CONTRAST  (INCLUDING MRCP) TECHNIQUE: Multiplanar multisequence MR imaging of the abdomen was performed. Heavily T2-weighted images of the biliary and pancreatic ducts were obtained, and three-dimensional MRCP images were rendered by post processing. COMPARISON:  CT abdomen/pelvis dated 04/08/2017 FINDINGS: Severely motion degraded images. Lower chest: Small bilateral pleural effusions. Associated lower lobe opacities, likely atelectasis. Hepatobiliary: Numerous hepatic metastases, measuring up to 5.0 cm in segment 4A (series 4001/image 11) and 6.3 cm in segment 5 (series 4001/image 16). Distended gallbladder with cholelithiasis. Intrahepatic and extrahepatic ductal dilatation, with layering sludge/gallstones within the distal common duct (series 8001/image 21). Pancreas:  Mass-like enlargement of the pancreatic tail, measuring 2.7 x 5.3 cm (series 4001/image 17). Spleen:  Within normal limits. Adrenals/Urinary Tract:  Adrenal glands are grossly unremarkable. Kidneys are within normal limits.  No hydronephrosis. Stomach/Bowel: 2.4 cm cystic lesion along the gastric cardia (series 4001/image 20). Stomach is otherwise unremarkable.  Visualized bowel is grossly unremarkable. Vascular/Lymphatic:  No evidence of abdominal aortic aneurysm. 1.6 cm short axis portacaval node (series 8001/image 20). Other:  Moderate abdominal ascites. Suspected peritoneal disease/omental caking beneath the left mid anterior abdominal wall (series 8001/image 25), although poorly evaluated. Musculoskeletal: Heterogeneous marrow signal in the visualized thoracolumbar spine, metastases not excluded. IMPRESSION: Limited evaluation due to severe motion degradation and lack of intravenous contrast administration. Mass-like enlargement of the pancreatic tail, measuring 2.7 x 5.3 cm, worrisome for primary pancreatic neoplasm given additional findings. 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 Moderate abdominal ascites. Suspected peritoneal disease/omental caking beneath the left mid anterior abdominal wall. 1.6 cm short axis portacaval node. Distended gallbladder with cholelithiasis. Intrahepatic and extrahepatic ductal dilatation with layering sludge/stones in the common duct. Electronically Signed   By: Julian Hy M.D.   On: 04/09/2018 12:26   Mr 3d Recon At Scanner  Result Date: 04/09/2018 CLINICAL DATA:  Abnormal LFTs EXAM: MRI ABDOMEN WITHOUT CONTRAST  (INCLUDING MRCP) TECHNIQUE: Multiplanar multisequence MR imaging of the abdomen was performed. Heavily T2-weighted images of the biliary and pancreatic ducts were obtained, and three-dimensional MRCP images were rendered by post processing. COMPARISON:  CT abdomen/pelvis dated 04/08/2017 FINDINGS: Severely motion degraded images. Lower chest: Small bilateral pleural effusions. Associated lower lobe opacities, likely atelectasis. Hepatobiliary: Numerous hepatic metastases, measuring up to 5.0 cm in segment 4A (series 4001/image 11) and 6.3 cm in segment 5 (series 4001/image 16). Distended gallbladder with cholelithiasis. Intrahepatic and extrahepatic ductal dilatation, with layering  sludge/gallstones within the distal common duct (series 8001/image 21). Pancreas: Mass-like enlargement of the pancreatic tail, measuring 2.7 x 5.3 cm (series 4001/image 17). Spleen:  Within normal limits. Adrenals/Urinary Tract:  Adrenal glands are grossly unremarkable. Kidneys are within normal limits.  No hydronephrosis. Stomach/Bowel: 2.4 cm cystic lesion along the gastric cardia (series 4001/image 20). Stomach is otherwise unremarkable. Visualized bowel is grossly unremarkable. Vascular/Lymphatic:  No evidence of abdominal aortic aneurysm. 1.6 cm short axis portacaval node (series 8001/image 20). Other:  Moderate abdominal ascites. Suspected peritoneal disease/omental caking beneath the left mid anterior abdominal wall (series 8001/image 25), although poorly evaluated. Musculoskeletal: Heterogeneous marrow signal in the visualized thoracolumbar spine, metastases not excluded. IMPRESSION: Limited evaluation due to severe motion degradation and lack of intravenous contrast administration. Mass-like enlargement of the pancreatic tail, measuring 2.7 x 5.3 cm, worrisome for primary pancreatic neoplasm given additional findings. 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 Moderate abdominal ascites. Suspected peritoneal disease/omental caking beneath the left mid anterior abdominal wall. 1.6 cm short axis portacaval node. Distended gallbladder with cholelithiasis. Intrahepatic and extrahepatic ductal dilatation with layering sludge/stones in the common duct. Electronically Signed   By: Julian Hy M.D.   On: 04/09/2018 12:26   Dg Chest Port 1 View  Result Date: 04/09/2018 CLINICAL DATA:  Pneumonia. EXAM: PORTABLE CHEST 1 VIEW COMPARISON:  04/08/2018 FINDINGS: The patient is mildly rotated to the right. Right jugular catheter terminates over the lower SVC. The cardiomediastinal silhouette is unchanged allowing for patient rotation. The lungs are hypoinflated. Right infrahilar airspace  opacity is partially obscured by superimposition of the heart due to patient rotation but has not grossly  worsened. There is mild patchy and linear opacity in the left lung base which is stable to slightly increased. Mild hazy perihilar opacity is present bilaterally, and the interstitial markings are mildly accentuated diffusely. There may be tiny bilateral pleural effusions. No pneumothorax is identified. IMPRESSION: 1. Low lung volumes with patchy bibasilar opacities which are stable to slightly worsened and which may reflect pneumonia or atelectasis. 2. Hazy perihilar opacities and interstitial prominence may reflect mild edema with tiny pleural effusions. Electronically Signed   By: Logan Bores M.D.   On: 04/09/2018 11:36   Assessment:  1.  Elevated LFTs.  2.  Jaundice. 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 4.  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  Plan:  1.  Patient is in no shape for any type of endoscopic procedure, sedation of which would almost certainly lead to cardiac arrest. 2.  Continue supportive care for now. 3.  I don't see patient ever being well enough to tolerate any type of endoscopic procedure; moreover, don't think doing ERCP or pancreatic biopsy or anything of this sort will help her; her case unfortunately appears to be grim.   4.  I think consideration of palliative care is very reasonable. 5.  [facility] will sign-off; please call with questions; thank you for the consultation.   Landry Dyke 04/10/2018, 10:55 AM   Cell [phone] If no answer or after 5 PM call [phone]

## 2018-04-10 NOTE — Progress Notes (Signed)
Pt transferred from [facility] this afternoon. Currently DNR with active treatment orders. Telemetry and cont pulse ox discontinued when pt was transferred

## 2018-04-10 NOTE — Progress Notes (Signed)
Pharmacy Antibiotic Note  [name] is a 67 y.o. female admitted on 04/07/2018 with shock. Patient has liver and pancreatic masses on MRI. Pt continues on unasyn for intra-abdominal infection. CRRT is stopping today. Pt is afebrile but WBC is elevated. Scr is down considerably to 2.57.   Plan: Change unasyn to 3gm IV Q12H Change fluconazole to 200mg  IV Q24H F/u renal fxn, C&S, clinical status and LOT   Height: 5\' 8"  (172.7 cm) Weight: 135 lb 9.3 oz (61.5 kg) IBW/kg (Calculated) : 63.9  Temp (24hrs), Avg:97.4 F (36.3 C), Min:96.4 F (35.8 C), Max:98.4 F (36.9 C)  Recent Labs  Lab 04/07/18 2042 04/07/18 2121 04/07/18 2139 04/07/18 2351 04/08/18 1422 04/09/18 0515 04/09/18 2142 04/10/18 0504  WBC 22.4*  --  23.4*  --   --  21.9*  --   --   CREATININE 12.06*  --  12.10*  --  10.15* 4.91* 3.20* 2.57*  LATICACIDVEN  --  1.22  --  0.74  --   --   --   --     Estimated Creatinine Clearance: 21.2 mL/min (A) (by C-G formula based on SCr of 2.57 mg/dL (H)).    Allergies  Allergen Reactions  . Sulfa Antibiotics Hives and Other (See Comments)    "in my shoulders; legs; feet; made me burn all over"    Vanc 5/8 x1 Zosyn 04/07/2018 >> 5/10 Diflucan 5/9 >> Unasyn 5/10 >>  5/8 blood cx: ngtd 5/9 mrsa pcr: neg 5/9 cdiff: NEG 5/8 Urine - NEG  Salome Arnt, PharmD, BCPS Phone #: [phone] until 3pm All other times, call [place] x [phone] 04/10/2018 11:09 AM

## 2018-04-10 NOTE — Progress Notes (Signed)
[facility] ROUNDING NOTE   Subjective:   HIV followed by Dr. Tommy Medal treated with Symtuza. 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 At her last office visit she was felt to have good control of her disease. 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 Family reports she has been unwell for over one month, but is usually responsive. She was last spoken to by her sister 2 days ago when she was complaining of abdominal pain and diarrhea. Family is worried that her caretakers are not competent. She has supposedly not been eating or drinking for days. ED she was found to be hypotensive despite IVF resuscitation. Levophed started. Laboratory evaluation significant forcreatinine of 12.06 and BUN of 115.   She was started on CRRT 5/9 and appears to be tolerating this well  , there is still very little urine. Patient appears to be stable this morning and is alert and oriented       Objective:  Vital signs in last 24 hours:  Temp:  [94.3 F (34.6 C)-98.4 F (36.9 C)] 98.4 F (36.9 C) (05/11 0400) Pulse Rate:  [62-80] 73 (05/11 0700) Resp:  [11-31] 17 (05/11 0700) BP: (86-107)/(56-76) 102/63 (05/11 0700) SpO2:  [94 %-100 %] 100 % (05/11 0700) Weight:  [135 lb 9.3 oz (61.5 kg)] 135 lb 9.3 oz (61.5 kg) (05/11 0400)  Weight change: -15 lb 6.9 oz (-7 kg) Filed Weights   04/08/18 1400 04/09/18 0400 04/10/18 0400  Weight: 151 lb 0.2 oz (68.5 kg) 133 lb 13.1 oz (60.7 kg) 135 lb 9.3 oz (61.5 kg)    Intake/Output: I/O last 3 completed shifts: In: 4235 [I.V.:4860; IV Piggyback:1050] Out: 3614 [Urine:362; Other:5862]   Intake/Output this shift:  No intake/output data recorded.  Awake and oriented  CVS- RRR  No JVP noted  RS-  Diminished at base   ABD- BS present soft non-distended  Some oozing from site of paracentesis EXT- no edema   Basic Metabolic Panel: Recent Labs  Lab  04/07/18 2139 04/08/18 1422 04/09/18 0515 04/09/18 2142 04/10/18 0504  NA 136 137 138 136 138  K 3.6 2.6* 3.0* 4.1 3.4*  CL 102 101 98* 97* 98*  CO2 13* 18* 22 29 30   GLUCOSE 89 125* 108* 121* 115*  BUN 113* 99* 48* 29* 22*  CREATININE 12.10* 10.15* 4.91* 3.20* 2.57*  CALCIUM 7.7* 6.4* 7.2* 7.0* 7.0*  MG  --   --  2.0  --  2.1  PHOS  --  7.6* 3.2 2.1* 1.9*    Liver Function Tests: Recent Labs  Lab 04/07/18 2139 04/08/18 1422 04/09/18 0515 04/09/18 2142 04/10/18 0504  AST 111*  --  104*  --   --   ALT 45  --  38  --   --   ALKPHOS 764*  --  675*  --   --   BILITOT 48.9*  --  49.9*  --   --   PROT 5.9*  --  5.7*  --   --   ALBUMIN 1.8* 1.7* 1.8*  1.8* 1.5* 1.4*   No results for input(s): LIPASE, AMYLASE in the last 168 hours. Recent Labs  Lab 04/07/18 2139 04/09/18 0404  AMMONIA 67* 62*    CBC: Recent Labs  Lab 04/07/18 2042 04/07/18 2139 04/08/18 2217 04/09/18 0515  WBC 22.4* 23.4*  --  21.9*  NEUTROABS  --  20.7*  --   --   HGB 6.3* 6.1*  9.9* 9.9*  HCT 18.9* 18.3* 28.1* 28.1*  MCV 100.0 100.0  --  89.2  PLT 355 323  --  166    Cardiac Enzymes: Recent Labs  Lab 04/07/18 2042 04/07/18 2218 04/09/18 0515  CKTOTAL 643*  --  618*  TROPONINI  --  0.39* 0.25*    BNP: Invalid input(s): POCBNP  CBG: Recent Labs  Lab 04/09/18 0744 04/09/18 1547 04/09/18 1954 04/09/18 2318 04/10/18 0336  GLUCAP 97 42 89 104* 105*    Microbiology: Results for orders placed or performed during the hospital encounter of 04/07/18  Urine culture     Status: None   Collection Time: 04/07/18  8:42 PM  Result Value Ref Range Status   Specimen Description   Final    URINE, CATHETERIZED Performed at [facility], [place] [address] [place], [place] [address]    Special Requests   Final    NONE Performed at [facility], [place] [address], [place], [place] [address]    Culture   Final    NO GROWTH Performed at [facility], [place] [address] [place], [place] [address]    Report Status 04/09/2018 FINAL  Final  Blood Cultures (routine x 2)     Status: None (Preliminary result)   Collection Time: 04/07/18  9:39 PM  Result Value Ref Range Status   Specimen Description   Final    BLOOD RIGHT ANTECUBITAL Performed at [facility] [address], [place], [place] [address]    Special Requests   Final    BOTTLES DRAWN AEROBIC AND ANAEROBIC Blood Culture adequate volume Performed at [place] [address] [place], [place] [address]    Culture   Final    NO GROWTH 2 DAYS Performed at [facility] [address], [place], [place] [address]    Report Status PENDING  Incomplete  Blood Cultures (routine x 2)     Status: None (Preliminary result)   Collection Time: 04/07/18  9:39 PM  Result Value Ref Range Status   Specimen Description   Final    BLOOD LEFT ANTECUBITAL Performed at [place] [address], [place], [place] [address]    Special Requests   Final    AEROBIC BOTTLE ONLY Blood Culture adequate volume Performed at [place] [address] [place], [place] [address]    Culture   Final    NO GROWTH 2 DAYS Performed at [facility] [address], [place], [place] [address]    Report Status PENDING  Incomplete  MRSA PCR Screening     Status: None   Collection Time: 04/08/18  9:11 AM  Result Value Ref Range Status   MRSA by PCR NEGATIVE NEGATIVE Final    Comment:        The GeneXpert MRSA Assay (FDA approved for NASAL specimens only), is one component of a comprehensive MRSA colonization surveillance program. It is not intended to diagnose MRSA infection nor to guide or monitor treatment for MRSA infections. Performed at [facility], [place] [address], [place],  [address]   C difficile quick scan w PCR reflex     Status: None   Collection Time: 04/08/18  4:28 PM   Result Value Ref Range Status   C Diff antigen NEGATIVE NEGATIVE Final   C Diff toxin NEGATIVE NEGATIVE Final   C Diff interpretation No C. difficile detected.  Final  Comment: Performed at [facility], [place] [address], [place], [place] [address]  Gastrointestinal Panel by PCR , Stool     Status: None   Collection Time: 04/08/18  4:31 PM  Result Value Ref Range Status   Campylobacter species NOT DETECTED NOT DETECTED Final   Plesimonas shigelloides NOT DETECTED NOT DETECTED Final   Salmonella species NOT DETECTED NOT DETECTED Final   Yersinia enterocolitica NOT DETECTED NOT DETECTED Final   Vibrio species NOT DETECTED NOT DETECTED Final   Vibrio cholerae NOT DETECTED NOT DETECTED Final   Enteroaggregative E coli (EAEC) NOT DETECTED NOT DETECTED Final   Enteropathogenic E coli (EPEC) NOT DETECTED NOT DETECTED Final   Enterotoxigenic E coli (ETEC) NOT DETECTED NOT DETECTED Final   Shiga like toxin producing E coli (STEC) NOT DETECTED NOT DETECTED Final   Shigella/Enteroinvasive E coli (EIEC) NOT DETECTED NOT DETECTED Final   Cryptosporidium NOT DETECTED NOT DETECTED Final   Cyclospora cayetanensis NOT DETECTED NOT DETECTED Final   Entamoeba histolytica NOT DETECTED NOT DETECTED Final   Giardia lamblia NOT DETECTED NOT DETECTED Final   Adenovirus F40/41 NOT DETECTED NOT DETECTED Final   Astrovirus NOT DETECTED NOT DETECTED Final   Norovirus GI/GII NOT DETECTED NOT DETECTED Final   Rotavirus A NOT DETECTED NOT DETECTED Final   Sapovirus (I, II, IV, and V) NOT DETECTED NOT DETECTED Final    Comment: Performed at [facility], [place] [place], [place] [address]  Body fluid culture     Status: None (Preliminary result)   Collection Time: 04/09/18  5:02 PM  Result Value Ref Range Status   Specimen Description PERITONEAL  Final   Special Requests NONE  Final   Gram Stain   Final    ABUNDANT WBC PRESENT,BOTH PMN AND MONONUCLEAR NO ORGANISMS SEEN Performed at  [facility], [address] [place], [place] [address]    Culture PENDING  Incomplete   Report Status PENDING  Incomplete    Coagulation Studies: Recent Labs    04/07/18 24-Mar-2138 04/09/18 0515  LABPROT 19.3* 19.1*  INR 1.65 1.62    Urinalysis: Recent Labs    04/07/18 2041/03/24 04/08/18 1031  COLORURINE Sison* Wilz*  LABSPEC 1.025 TEST NOT REPORTED DUE TO COLOR INTERFERENCE OF URINE PIGMENT  PHURINE 6.5 TEST NOT REPORTED DUE TO COLOR INTERFERENCE OF URINE PIGMENT  GLUCOSEU 100* TEST NOT REPORTED DUE TO COLOR INTERFERENCE OF URINE PIGMENT*  HGBUR TRACE* TEST NOT REPORTED DUE TO COLOR INTERFERENCE OF URINE PIGMENT*  BILIRUBINUR LARGE* TEST NOT REPORTED DUE TO COLOR INTERFERENCE OF URINE PIGMENT*  KETONESUR TRACE* TEST NOT REPORTED DUE TO COLOR INTERFERENCE OF URINE PIGMENT*  PROTEINUR 100* TEST NOT REPORTED DUE TO COLOR INTERFERENCE OF URINE PIGMENT*  NITRITE POSITIVE* TEST NOT REPORTED DUE TO COLOR INTERFERENCE OF URINE PIGMENT*  LEUKOCYTESUR NEGATIVE TEST NOT REPORTED DUE TO COLOR INTERFERENCE OF URINE PIGMENT*      Imaging: US Renal  Result Date: 04/08/2018 CLINICAL DATA:  67 year old female with a history of acute renal disease EXAM: RENAL / URINARY TRACT ULTRASOUND COMPLETE COMPARISON:  None. FINDINGS: Right Kidney: Length: 13.3 cm. Echogenicity within normal limits. No mass or hydronephrosis visualized. Left Kidney: Length: 12.2 cm. Echogenicity within normal limits. No mass or hydronephrosis visualized. Additional: Multiple mixed echogenicity liver masses are present. Ascites. Bladder: Appears normal for degree of bladder distention. IMPRESSION: No evidence of hydronephrosis. Multiple liver masses, compatible with metastatic disease. Ascites. These results were called by telephone at the time of interpretation on  04/08/2018 at 1:37 pm to Marni Griffon who verbally acknowledged these results. Electronically Signed   By: Corrie Mckusick D.O.   On: 04/08/2018 13:39   Mr Abdomen Mrcp  Wo Contrast  Result Date: 04/09/2018 CLINICAL DATA:  Abnormal LFTs EXAM: MRI ABDOMEN WITHOUT CONTRAST  (INCLUDING MRCP) TECHNIQUE: Multiplanar multisequence MR imaging of the abdomen was performed. Heavily T2-weighted images of the biliary and pancreatic ducts were obtained, and three-dimensional MRCP images were rendered by post processing. COMPARISON:  CT abdomen/pelvis dated 04/08/2017 FINDINGS: Severely motion degraded images. Lower chest: Small bilateral pleural effusions. Associated lower lobe opacities, likely atelectasis. Hepatobiliary: Numerous hepatic metastases, measuring up to 5.0 cm in segment 4A (series 4001/image 11) and 6.3 cm in segment 5 (series 4001/image 16). Distended gallbladder with cholelithiasis. Intrahepatic and extrahepatic ductal dilatation, with layering sludge/gallstones within the distal common duct (series 8001/image 21). Pancreas: Mass-like enlargement of the pancreatic tail, measuring 2.7 x 5.3 cm (series 4001/image 17). Spleen:  Within normal limits. Adrenals/Urinary Tract:  Adrenal glands are grossly unremarkable. Kidneys are within normal limits.  No hydronephrosis. Stomach/Bowel: 2.4 cm cystic lesion along the gastric cardia (series 4001/image 20). Stomach is otherwise unremarkable. Visualized bowel is grossly unremarkable. Vascular/Lymphatic:  No evidence of abdominal aortic aneurysm. 1.6 cm short axis portacaval node (series 8001/image 20). Other:  Moderate abdominal ascites. Suspected peritoneal disease/omental caking beneath the left mid anterior abdominal wall (series 8001/image 25), although poorly evaluated. Musculoskeletal: Heterogeneous marrow signal in the visualized thoracolumbar spine, metastases not excluded. IMPRESSION: Limited evaluation due to severe motion degradation and lack of intravenous contrast administration. Mass-like enlargement of the pancreatic tail, measuring 2.7 x 5.3 cm, worrisome for primary pancreatic neoplasm given additional findings.  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 Moderate abdominal ascites. Suspected peritoneal disease/omental caking beneath the left mid anterior abdominal wall. 1.6 cm short axis portacaval node. Distended gallbladder with cholelithiasis. Intrahepatic and extrahepatic ductal dilatation with layering sludge/stones in the common duct. Electronically Signed   By: Julian Hy M.D.   On: 04/09/2018 12:26   Mr 3d Recon At Scanner  Result Date: 04/09/2018 CLINICAL DATA:  Abnormal LFTs EXAM: MRI ABDOMEN WITHOUT CONTRAST  (INCLUDING MRCP) TECHNIQUE: Multiplanar multisequence MR imaging of the abdomen was performed. Heavily T2-weighted images of the biliary and pancreatic ducts were obtained, and three-dimensional MRCP images were rendered by post processing. COMPARISON:  CT abdomen/pelvis dated 04/08/2017 FINDINGS: Severely motion degraded images. Lower chest: Small bilateral pleural effusions. Associated lower lobe opacities, likely atelectasis. Hepatobiliary: Numerous hepatic metastases, measuring up to 5.0 cm in segment 4A (series 4001/image 11) and 6.3 cm in segment 5 (series 4001/image 16). Distended gallbladder with cholelithiasis. Intrahepatic and extrahepatic ductal dilatation, with layering sludge/gallstones within the distal common duct (series 8001/image 21). Pancreas: Mass-like enlargement of the pancreatic tail, measuring 2.7 x 5.3 cm (series 4001/image 17). Spleen:  Within normal limits. Adrenals/Urinary Tract:  Adrenal glands are grossly unremarkable. Kidneys are within normal limits.  No hydronephrosis. Stomach/Bowel: 2.4 cm cystic lesion along the gastric cardia (series 4001/image 20). Stomach is otherwise unremarkable. Visualized bowel is grossly unremarkable. Vascular/Lymphatic:  No evidence of abdominal aortic aneurysm. 1.6 cm short axis portacaval node (series 8001/image 20). Other:  Moderate abdominal ascites. Suspected peritoneal disease/omental caking beneath the left  mid anterior abdominal wall (series 8001/image 25), although poorly evaluated. Musculoskeletal: Heterogeneous marrow signal in the visualized thoracolumbar spine, metastases not excluded. IMPRESSION: Limited evaluation due to severe motion degradation and lack of intravenous contrast administration. Mass-like enlargement of the pancreatic  tail, measuring 2.7 x 5.3 cm, worrisome for primary pancreatic neoplasm given additional findings. 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 Moderate abdominal ascites. Suspected peritoneal disease/omental caking beneath the left mid anterior abdominal wall. 1.6 cm short axis portacaval node. Distended gallbladder with cholelithiasis. Intrahepatic and extrahepatic ductal dilatation with layering sludge/stones in the common duct. Electronically Signed   By: Julian Hy M.D.   On: 04/09/2018 12:26   Dg Chest Port 1 View  Result Date: 04/09/2018 CLINICAL DATA:  Pneumonia. EXAM: PORTABLE CHEST 1 VIEW COMPARISON:  04/08/2018 FINDINGS: The patient is mildly rotated to the right. Right jugular catheter terminates over the lower SVC. The cardiomediastinal silhouette is unchanged allowing for patient rotation. The lungs are hypoinflated. Right infrahilar airspace opacity is partially obscured by superimposition of the heart due to patient rotation but has not grossly worsened. There is mild patchy and linear opacity in the left lung base which is stable to slightly increased. Mild hazy perihilar opacity is present bilaterally, and the interstitial markings are mildly accentuated diffusely. There may be tiny bilateral pleural effusions. No pneumothorax is identified. IMPRESSION: 1. Low lung volumes with patchy bibasilar opacities which are stable to slightly worsened and which may reflect pneumonia or atelectasis. 2. Hazy perihilar opacities and interstitial prominence may reflect mild edema with tiny pleural effusions. Electronically Signed   By: Logan Bores M.D.   On: 04/09/2018 11:36     Medications:   . sodium chloride    . sodium chloride 10 mL/hr at 04/10/18 0700  . sodium chloride    . sodium chloride    . ampicillin-sulbactam (UNASYN) IV Stopped (04/10/18 0234)  . famotidine (PEPCID) IV Stopped (04/09/18 2138)  . fluconazole (DIFLUCAN) IV Stopped (04/09/18 1545)  .  sodium bicarbonate  infusion 1000 mL 125 mL/hr at 04/10/18 0700   . chlorhexidine  15 mL Mouth Rinse BID  . Chlorhexidine Gluconate Cloth  6 each Topical Q0600  . Gerhardt's butt cream   Topical BID  . insulin aspart  1-3 Units Subcutaneous Q4H  . mouth rinse  15 mL Mouth Rinse q12n4p  . pneumococcal 23 valent vaccine  0.5 mL Intramuscular Tomorrow-1000  . 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

## 2018-04-11 DIAGNOSIS — D649 Anemia, unspecified: Secondary | ICD-10-CM

## 2018-04-11 DIAGNOSIS — R945 Abnormal results of liver function studies: Secondary | ICD-10-CM

## 2018-04-11 LAB — BPAM RBC
BLOOD PRODUCT EXPIRATION DATE: 201906022359
Blood Product Expiration Date: 201906022359
Blood Product Expiration Date: 201906022359
Blood Product Expiration Date: 201906022359
ISSUE DATE / TIME: 201905090111
ISSUE DATE / TIME: 201905090629
ISSUE DATE / TIME: 201905090334
UNIT TYPE AND RH: 5100
Unit Type and Rh: 5100
Unit Type and Rh: 5100
Unit Type and Rh: 5100

## 2018-04-11 LAB — GLUCOSE, CAPILLARY
GLUCOSE-CAPILLARY: 109 mg/dL — AB (ref 65–99)
GLUCOSE-CAPILLARY: 77 mg/dL (ref 65–99)
GLUCOSE-CAPILLARY: 101 mg/dL — AB (ref 65–99)
Glucose-Capillary: 114 mg/dL — ABNORMAL HIGH (ref 65–99)
Glucose-Capillary: 115 mg/dL — ABNORMAL HIGH (ref 65–99)
Glucose-Capillary: 133 mg/dL — ABNORMAL HIGH (ref 65–99)
Glucose-Capillary: 82 mg/dL (ref 65–99)
Glucose-Capillary: 99 mg/dL (ref 65–99)

## 2018-04-11 LAB — TYPE AND SCREEN
ABO/RH(D): O POS
ANTIBODY SCREEN: NEGATIVE
UNIT DIVISION: 0
UNIT DIVISION: 0
Unit division: 0
Unit division: 0

## 2018-04-11 LAB — RENAL FUNCTION PANEL
ALBUMIN: 1.5 g/dL — AB (ref 3.5–5.0)
Anion gap: 13 (ref 5–15)
BUN: 27 mg/dL — ABNORMAL HIGH (ref 6–20)
CALCIUM: 6.8 mg/dL — AB (ref 8.9–10.3)
CO2: 36 mmol/L — ABNORMAL HIGH (ref 22–32)
Chloride: 91 mmol/L — ABNORMAL LOW (ref 101–111)
Creatinine, Ser: 3.49 mg/dL — ABNORMAL HIGH (ref 0.44–1.00)
GFR calc non Af Amer: 13 mL/min — ABNORMAL LOW (ref >60)
GFR, EST AFRICAN AMERICAN: 15 mL/min — AB (ref >60)
GLUCOSE: 129 mg/dL — AB (ref 65–99)
PHOSPHORUS: 2.8 mg/dL (ref 2.5–4.6)
POTASSIUM: 2.7 mmol/L — AB (ref 3.5–5.1)
Sodium: 140 mmol/L (ref 135–145)

## 2018-04-11 LAB — MAGNESIUM: MAGNESIUM: 1.9 mg/dL (ref 1.7–2.4)

## 2018-04-11 LAB — CANCER ANTIGEN 19-9: CA 19-9: 205200 U/mL — ABNORMAL HIGH (ref 0–35)

## 2018-04-11 MED ORDER — POTASSIUM CHLORIDE 10 MEQ/50ML IV SOLN
10.0000 meq | INTRAVENOUS | Status: AC
  Administered 2018-04-11 (×5): 10 meq via INTRAVENOUS
  Filled 2018-04-11 (×6): qty 50

## 2018-04-11 MED ORDER — HYDROMORPHONE HCL 2 MG/ML IJ SOLN
1.0000 mg | INTRAMUSCULAR | Status: DC | PRN
Start: 2018-04-11 — End: 2018-04-12
  Administered 2018-04-11 – 2018-04-12 (×3): 1 mg via INTRAVENOUS
  Filled 2018-04-11 (×3): qty 1

## 2018-04-11 MED ORDER — LORAZEPAM 2 MG/ML IJ SOLN
1.0000 mg | INTRAMUSCULAR | Status: DC | PRN
  Administered 2018-04-11: 1 mg via INTRAVENOUS
  Filled 2018-04-11: qty 1

## 2018-04-11 MED ORDER — SODIUM CHLORIDE 0.9 % IV SOLN
INTRAVENOUS | Status: DC
  Administered 2018-04-11 – 2018-04-12 (×2): via INTRAVENOUS

## 2018-04-11 MED ORDER — MAGNESIUM SULFATE 2 GM/50ML IV SOLN
2.0000 g | Freq: Once | INTRAVENOUS | Status: AC
  Administered 2018-04-11: 2 g via INTRAVENOUS
  Filled 2018-04-11: qty 50

## 2018-04-11 MED ORDER — POTASSIUM PHOSPHATES 15 MMOLE/5ML IV SOLN
30.0000 mmol | Freq: Once | INTRAVENOUS | Status: AC
  Administered 2018-04-11: 30 mmol via INTRAVENOUS
  Filled 2018-04-11: qty 10

## 2018-04-11 NOTE — Progress Notes (Signed)
1mg  iv ativan administered for increased restlessness

## 2018-04-11 NOTE — Progress Notes (Signed)
[facility] ROUNDING NOTE   Subjective:   HIV followed by Dr. Tommy Medal treated with Symtuza. 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 At her last office visit she was felt to have good control of her disease. 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 Family reports she has been unwell for over one month, but is usually responsive. She was last spoken to by her sister 2 days ago when she was complaining of abdominal pain and diarrhea. Family is worried that her caretakers are not competent. She has supposedly not been eating or drinking for days. ED she was found to be hypotensive despite IVF resuscitation. Levophed started. Laboratory evaluation significant forcreatinine of 12.06 and BUN of 115.   She was started on CRRT 5/9 - 5/11  Now making urine       Objective:  Vital signs in last 24 hours:  Temp:  [97.6 F (36.4 C)-98.6 F (37 C)] 98.5 F (36.9 C) (05/12 1555) Pulse Rate:  [75-85] 81 (05/12 1555) Resp:  [20-21] 20 (05/12 1555) BP: (98-124)/(53-72) 124/71 (05/12 1555) SpO2:  [83 %-100 %] 98 % (05/12 1555) Weight:  [144 lb 10 oz (65.6 kg)] 144 lb 10 oz (65.6 kg) (05/12 0500)  Weight change: 9 lb 0.6 oz (4.1 kg) Filed Weights   04/09/18 0400 04/10/18 0400 04/11/18 0500  Weight: 133 lb 13.1 oz (60.7 kg) 135 lb 9.3 oz (61.5 kg) 144 lb 10 oz (65.6 kg)    Intake/Output: I/O last 3 completed shifts: In: 5060 [I.V.:4660; IV Piggyback:400] Out: 1497 [Urine:1800; WYOVZ:8588]   Intake/Output this shift:  No intake/output data recorded.  Awake and oriented  CVS- RRR  No JVP noted  RS-  Diminished at base   ABD- BS present soft non-distended  Some oozing from site of paracentesis EXT- no edema   Basic Metabolic Panel: Recent Labs  Lab 04/08/18 1422 04/09/18 0515 04/09/18 2142 04/10/18 0504 04/11/18 0419  NA 137 138 136 138 140  K 2.6* 3.0* 4.1 3.4*  2.7*  CL 101 98* 97* 98* 91*  CO2 18* 22 29 30  36*  GLUCOSE 125* 108* 121* 115* 129*  BUN 99* 48* 29* 22* 27*  CREATININE 10.15* 4.91* 3.20* 2.57* 3.49*  CALCIUM 6.4* 7.2* 7.0* 7.0* 6.8*  MG  --  2.0  --  2.1 1.9  PHOS 7.6* 3.2 2.1* 1.9* 2.8    Liver Function Tests: Recent Labs  Lab 04/07/18 2139 04/08/18 1422 04/09/18 0515 04/09/18 2142 04/10/18 0504 04/11/18 0419  AST 111*  --  104*  --   --   --   ALT 45  --  38  --   --   --   ALKPHOS 764*  --  675*  --   --   --   BILITOT 48.9*  --  49.9*  --   --   --   PROT 5.9*  --  5.7*  --   --   --   ALBUMIN 1.8* 1.7* 1.8*  1.8* 1.5* 1.4* 1.5*   No results for input(s): LIPASE, AMYLASE in the last 168 hours. Recent Labs  Lab 04/07/18 2139 04/09/18 0404  AMMONIA 67* 62*    CBC: Recent Labs  Lab 04/07/18 2042 04/07/18 2139 04/08/18 2217 04/09/18 0515  WBC 22.4* 23.4*  --  21.9*  NEUTROABS  --  20.7*  --   --   HGB 6.3* 6.1* 9.9* 9.9*  HCT 18.9* 18.3* 28.1*  28.1*  MCV 100.0 100.0  --  89.2  PLT 355 323  --  166    Cardiac Enzymes: Recent Labs  Lab 04/07/18 2042 04/07/18 2218 04/09/18 0515  CKTOTAL 643*  --  618*  TROPONINI  --  0.39* 0.25*    BNP: Invalid input(s): POCBNP  CBG: Recent Labs  Lab 04/10/18 2025 04/11/18 0048 04/11/18 0404 04/11/18 0854 04/11/18 1219  GLUCAP 115* 114* 109* 115* 72*    Microbiology: Results for orders placed or performed during the hospital encounter of 04/07/18  Urine culture     Status: None   Collection Time: 04/07/18  8:42 PM  Result Value Ref Range Status   Specimen Description   Final    URINE, CATHETERIZED Performed at [facility], [place] [address], [place], [place] [address]    Special Requests   Final    NONE Performed at [facility], [place] [address], [place], [place] [address]    Culture   Final    NO GROWTH Performed at [facility], [place] [address], [place], [place] [address]    Report Status  04/09/2018 FINAL  Final  Blood Cultures (routine x 2)     Status: None (Preliminary result)   Collection Time: 04/07/18  9:39 PM  Result Value Ref Range Status   Specimen Description   Final    BLOOD RIGHT ANTECUBITAL Performed at [place] [address], [place], [place] [address]    Special Requests   Final    BOTTLES DRAWN AEROBIC AND ANAEROBIC Blood Culture adequate volume Performed at [place] [address], [place], [place] [address]    Culture   Final    NO GROWTH 4 DAYS Performed at [facility], [place] [address], [place], [place] [address]    Report Status PENDING  Incomplete  Blood Cultures (routine x 2)     Status: None (Preliminary result)   Collection Time: 04/07/18  9:39 PM  Result Value Ref Range Status   Specimen Description   Final    BLOOD LEFT ANTECUBITAL Performed at [facility] [address] [place], [place] [address]    Special Requests   Final    AEROBIC BOTTLE ONLY Blood Culture adequate volume Performed at [facility] [address] [place], [place] [address]    Culture   Final    NO GROWTH 4 DAYS Performed at [facility], [place] [address], [place], [place] [address]    Report Status PENDING  Incomplete  MRSA PCR Screening     Status: None   Collection Time: 04/08/18  9:11 AM  Result Value Ref Range Status   MRSA by PCR NEGATIVE NEGATIVE Final    Comment:        The GeneXpert MRSA Assay (FDA approved for NASAL specimens only), is one component of a comprehensive MRSA colonization surveillance program. It is not intended to diagnose MRSA infection nor to guide or monitor treatment for MRSA infections. Performed at [facility], [place] [address] [place], [place] [address]   C difficile quick scan w PCR reflex     Status: None   Collection Time: 04/08/18  4:28 PM  Result Value Ref Range Status   C Diff antigen NEGATIVE NEGATIVE Final    C Diff toxin NEGATIVE NEGATIVE Final   C Diff interpretation No C. difficile detected.  Final    Comment: Performed at [facility], [place] [address], [place], [place] [address]  Gastrointestinal Panel by PCR , Stool     Status: None   Collection Time: 04/08/18  4:31 PM  Result Value Ref Range Status   Campylobacter species NOT DETECTED NOT DETECTED Final   Plesimonas shigelloides NOT DETECTED NOT DETECTED Final   Salmonella species NOT DETECTED NOT DETECTED Final   Yersinia enterocolitica NOT DETECTED NOT DETECTED Final   Vibrio species NOT DETECTED NOT DETECTED Final   Vibrio cholerae NOT DETECTED NOT DETECTED Final   Enteroaggregative E coli (EAEC) NOT DETECTED NOT DETECTED Final   Enteropathogenic E coli (EPEC) NOT DETECTED NOT DETECTED Final   Enterotoxigenic E coli (ETEC) NOT DETECTED NOT DETECTED Final   Shiga like toxin producing E coli (STEC) NOT DETECTED NOT DETECTED Final   Shigella/Enteroinvasive E coli (EIEC) NOT DETECTED NOT DETECTED Final   Cryptosporidium NOT DETECTED NOT DETECTED Final   Cyclospora cayetanensis NOT DETECTED NOT DETECTED Final   Entamoeba histolytica NOT DETECTED NOT DETECTED Final   Giardia lamblia NOT DETECTED NOT DETECTED Final   Adenovirus F40/41 NOT DETECTED NOT DETECTED Final   Astrovirus NOT DETECTED NOT DETECTED Final   Norovirus GI/GII NOT DETECTED NOT DETECTED Final   Rotavirus A NOT DETECTED NOT DETECTED Final   Sapovirus (I, II, IV, and V) NOT DETECTED NOT DETECTED Final    Comment: Performed at [facility], [place] [place], [place] [address]  Body fluid culture     Status: None (Preliminary result)   Collection Time: 04/09/18  5:02 PM  Result Value Ref Range Status   Specimen Description PERITONEAL  Final   Special Requests NONE  Final   Gram Stain   Final    ABUNDANT WBC PRESENT,BOTH PMN AND MONONUCLEAR NO ORGANISMS SEEN    Culture   Final    NO GROWTH 2 DAYS Performed at [facility], [address], [place], [place] [address]    Report Status PENDING  Incomplete    Coagulation Studies: Recent Labs    04/09/18 0515  LABPROT 19.1*  INR 1.62    Urinalysis: No results for input(s): COLORURINE, LABSPEC, PHURINE, GLUCOSEU, HGBUR, BILIRUBINUR, KETONESUR, PROTEINUR, UROBILINOGEN, NITRITE, LEUKOCYTESUR in the last 72 hours.  Invalid input(s): APPERANCEUR    Imaging: No results found.   Medications:   . sodium chloride 75 mL/hr at 04/11/18 1441  . ampicillin-sulbactam (UNASYN) IV Stopped (04/11/18 1518)  . famotidine (PEPCID) IV Stopped (04/10/18 2221)  . fluconazole (DIFLUCAN) IV Stopped (04/11/18 1518)   . chlorhexidine  15 mL Mouth Rinse BID  . Chlorhexidine Gluconate Cloth  6 each Topical Q0600  . Gerhardt's butt cream   Topical BID  . insulin aspart  1-3 Units Subcutaneous Q4H  . mouth rinse  15 mL Mouth Rinse q12n4p  . pneumococcal 23 valent vaccine  0.5 mL Intramuscular Tomorrow-1000  . 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 She has been on CRRT since 5/9 - 5/11  She as moved out of the ICU   She has an imcrease in urine output  Creatinine is slightly higher today  Poor candidate for dialysis especially in light of the fact that she may have metastatic cancer. 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

## 2018-04-11 NOTE — Progress Notes (Signed)
CRITICAL VALUE ALERT  Critical Value:  Potassium   Date & Time Notied:  04/11/2018 0535 . Provider Notified: Dr. Jimmy Footman  Orders Received/Actions taken: new order received.

## 2018-04-11 NOTE — Consult Note (Addendum)
[facility])  Received request from [place] for family interest in [facility]. Attempted visit. Patient not available for visit and no family present. Will attempt phone contact this afternoon or [facility] hospital liaison will follow up in am. Note Palliative Medicine Team Consult pending.   Thank you,  Erling Conte, LCSW [phone]

## 2018-04-11 NOTE — Progress Notes (Signed)
PROGRESS NOTE        PATIENT DETAILS Name: [name] Age: 67 y.o. Sex: female Date of Birth: Apr 20, 1951 Admit Date: 04/07/2018 Admitting Physician Kandice Hams, MD ION:GEXBM, Henderson Newcomer, MD  Brief Narrative: Patient is a 67 y.o. with prior history of HIV on antiretroviral therapy, thyroid cancer status post thyroidectomy 2014, cocaine use, anxiety/depression presented to the ED on 5/8 with septic shock, hypovolemia, acute renal failure and profound jaundice along with encephalopathy.  She was initially managed in the ICU-he required pressors and broad-spectrum antimicrobial therapy.  She was evaluated by infectious disease, gastroenterology and nephrology services.  She was briefly started on CRRT service with some improvement.  Upon some clinical stability-she was transferred to the hospitalist service on 5/12.  See below for further details  Subjective: Lethargic-confused-but upon repeated questioning-able to follow some commands.  Assessment/Plan: Septic shock: Source likely biliary in etiology-given choledocholithiasis.  Doubt SBP based on ascites fluid analysis.  Although somewhat improved stable blood pressure she continues to be very tenuous.  Continue Unasyn.  Gastroenterology has signed off-per GI-patient is not a candidate for any sort of endoscopic procedure at this time.  Awaiting palliative care evaluation for goals of care discussion with family-prognosis is very poor/dismal.  DNR in place  Acute kidney injury: Likely hemodynamically mediated-nephrology following-briefly required CRRT-adequate urine output overnight-continue to follow closely-await further recommendations from nephrology.  Clearly not a candidate for long-term hemodialysis in my opinion.  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  Await cytology from ascites fluid.  Await palliative care evaluation-suspect best served by transitioning to full comfort measures.  Acute metabolic encephalopathy: Secondary to sepsis/renal failure/jaundice-remains lethargic and confused this morning-although able to follow some commands.  CT of the head negative for acute abnormalities on admission.   Type II non-STEMI/demand ischemia: Clearly not a candidate for any sort of cardiac evaluation in her present condition..  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  Not a candidate for any sort of evaluation present condition.  Anemia: Suspect secondary to chronic disease-worsened by critical illness-status post 3 units of PRBC so far-hemoglobin currently stable.  Follow  Hypokalemia: Replete and recheck-stop IV fluids with IV bicarb.  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  Her prognosis is dismal-briefly spoke to 2 nephews at bedside-await palliative care evaluation for goals of care.  Best served by transition to full comfort measures.  DVT Prophylaxis: SCD's  Code Status: DNR  Family Communication: 2 nephews at bedside  Disposition Plan: Remain inpatient--with palliative care evaluation-may need residential hospice on discharge.  Antimicrobial agents: Anti-infectives (From admission, onward)   Start     Dose/Rate Route Frequency Ordered Stop   04/10/18 1400  fluconazole (DIFLUCAN) IVPB 200 mg     200 mg 100 mL/hr over 60  Minutes Intravenous Every 24 hours 04/10/18 1101     04/10/18 1400  Ampicillin-Sulbactam (UNASYN) 3 g in sodium chloride 0.9 % 100 mL  IVPB     3 g 200 mL/hr over 30 Minutes Intravenous Every 12 hours 04/10/18 1101     04/09/18 1800  Ampicillin-Sulbactam (UNASYN) 3 g in sodium chloride 0.9 % 100 mL IVPB  Status:  Discontinued     3 g 200 mL/hr over 30 Minutes Intravenous Every 8 hours 04/09/18 1525 04/10/18 1101   04/09/18 1200  rifaximin (XIFAXAN) tablet 550 mg     550 mg Oral 2 times daily 04/09/18 1109     04/08/18 1400  fluconazole (DIFLUCAN) IVPB 400 mg  Status:  Discontinued     400 mg 100 mL/hr over 120 Minutes Intravenous Every 24 hours 04/08/18 1220 04/10/18 1101   04/08/18 0600  piperacillin-tazobactam (ZOSYN) IVPB 2.25 g  Status:  Discontinued     2.25 g 100 mL/hr over 30 Minutes Intravenous Every 6 hours 04/08/18 0322 04/09/18 1518   04/07/18 2200  vancomycin (VANCOCIN) 1,500 mg in sodium chloride 0.9 % 500 mL IVPB  Status:  Discontinued     1,500 mg 250 mL/hr over 120 Minutes Intravenous STAT 04/07/18 2156 04/07/18 2158   04/07/18 2200  vancomycin (VANCOCIN) 1,250 mg in sodium chloride 0.9 % 250 mL IVPB     1,250 mg 166.7 mL/hr over 90 Minutes Intravenous STAT 04/07/18 2158 04/08/18 0030   04/07/18 2200  piperacillin-tazobactam (ZOSYN) IVPB 2.25 g     2.25 g 100 mL/hr over 30 Minutes Intravenous STAT 04/07/18 2158 04/07/18 2256   04/07/18 2130  piperacillin-tazobactam (ZOSYN) IVPB 3.375 g  Status:  Discontinued     3.375 g 100 mL/hr over 30 Minutes Intravenous STAT 04/07/18 2128 04/07/18 2158      Procedures: 5/10>> paracentesis  CONSULTS:  GI PCCM ID Renal  Time spent: 45 minutes-Greater than 50% of this time was spent in counseling, explanation of diagnosis, planning of further management, and coordination of care.  MEDICATIONS: Scheduled Meds: . chlorhexidine  15 mL Mouth Rinse BID  . Chlorhexidine Gluconate Cloth  6 each Topical Q0600  . Gerhardt's butt  cream   Topical BID  . insulin aspart  1-3 Units Subcutaneous Q4H  . mouth rinse  15 mL Mouth Rinse q12n4p  . pneumococcal 23 valent vaccine  0.5 mL Intramuscular Tomorrow-1000  . rifaximin  550 mg Oral BID   Continuous Infusions: . sodium chloride    . sodium chloride 10 mL/hr at 04/10/18 1600  . sodium chloride    . sodium chloride    . ampicillin-sulbactam (UNASYN) IV Stopped (04/11/18 0329)  . famotidine (PEPCID) IV Stopped (04/10/18 2221)  . fluconazole (DIFLUCAN) IV Stopped (04/10/18 1440)  . potassium PHOSPHATE IVPB (mmol) 30 mmol (04/11/18 0830)  .  sodium bicarbonate  infusion 1000 mL 125 mL/hr at 04/11/18 0828   PRN Meds:.sodium chloride, heparin   PHYSICAL EXAM: Vital signs: Vitals:   04/10/18 1622 04/10/18 2155 04/11/18 0500 04/11/18 0504  BP: 104/71 (!) 98/53  120/72  Pulse: 85 85  75  Resp: 20 20  (!) 21  Temp: 98.6 F (37 C) 98.5 F (36.9 C)  97.6 F (36.4 C)  TempSrc: Oral Oral  Oral  SpO2: 100% 96%  (!) 83%  Weight:   65.6 kg (144 lb 10 oz)   Height:       Filed Weights   04/09/18 0400 04/10/18 0400 04/11/18 0500  Weight: 60.7 kg (133 lb 13.1 oz) 61.5 kg (135 lb 9.3 oz) 65.6 kg (144 lb 10 oz)   Body mass index is 21.99 kg/m.   General appearance:  Lethargic-confused HEENT: Atraumatic and Normocephalic Neck: supple Resp:Good air entry-scattered rhonchi heard anteriorly.    CVS: S1 S2 regular GI: Bowel sounds present, distended-soft-some mild discharge from the paracentesis site. Extremities: B/L Lower Ext shows no edema, both legs are warm to touch Neurology: Difficult exam given mental status-but seems to be moving all 4 extremities.  Musculoskeletal:No digital cyanosis Skin:No Rash, warm and dry Wounds:N/A  I have personally reviewed following labs and imaging studies  LABORATORY DATA: CBC: Recent Labs  Lab 04/07/18 2042 04/07/18 2139 04/08/18 2217 04/09/18 0515  WBC 22.4* 23.4*  --  21.9*  NEUTROABS  --  20.7*  --   --   HGB 6.3*  6.1* 9.9* 9.9*  HCT 18.9* 18.3* 28.1* 28.1*  MCV 100.0 100.0  --  89.2  PLT 355 323  --  209    Basic Metabolic Panel: Recent Labs  Lab 04/08/18 1422 04/09/18 0515 04/09/18 2142 04/10/18 0504 04/11/18 0419  NA 137 138 136 138 140  K 2.6* 3.0* 4.1 3.4* 2.7*  CL 101 98* 97* 98* 91*  CO2 18* 22 29 30  36*  GLUCOSE 125* 108* 121* 115* 129*  BUN 99* 48* 29* 22* 27*  CREATININE 10.15* 4.91* 3.20* 2.57* 3.49*  CALCIUM 6.4* 7.2* 7.0* 7.0* 6.8*  MG  --  2.0  --  2.1 1.9  PHOS 7.6* 3.2 2.1* 1.9* 2.8    GFR: Estimated Creatinine Clearance: 16.2 mL/min (A) (by C-G formula based on SCr of 3.49 mg/dL (H)).  Liver Function Tests: Recent Labs  Lab 04/07/18 2139 04/08/18 1422 04/09/18 0515 04/09/18 2142 04/10/18 0504 04/11/18 0419  AST 111*  --  104*  --   --   --   ALT 45  --  38  --   --   --   ALKPHOS 764*  --  675*  --   --   --   BILITOT 48.9*  --  49.9*  --   --   --   PROT 5.9*  --  5.7*  --   --   --   ALBUMIN 1.8* 1.7* 1.8*  1.8* 1.5* 1.4* 1.5*   No results for input(s): LIPASE, AMYLASE in the last 168 hours. Recent Labs  Lab 04/07/18 2139 04/09/18 0404  AMMONIA 67* 62*    Coagulation Profile: Recent Labs  Lab 04/07/18 2139 04/09/18 0515  INR 1.65 1.62    Cardiac Enzymes: Recent Labs  Lab 04/07/18 2042 04/07/18 2218 04/09/18 0515  CKTOTAL 643*  --  618*  TROPONINI  --  0.39* 0.25*    BNP (last 3 results) No results for input(s): PROBNP in the last 8760 hours.  HbA1C: No results for input(s): HGBA1C in the last 72 hours.  CBG: Recent Labs  Lab 04/10/18 2025 04/11/18 0048 04/11/18 0404 04/11/18 0854 04/11/18 1219  GLUCAP 115* 114* 109* 115* 133*    Lipid Profile: No results for input(s): CHOL, HDL, LDLCALC, TRIG, CHOLHDL, LDLDIRECT in the last 72 hours.  Thyroid Function Tests: Recent Labs    04/09/18 0405  TSH 15.913*    Anemia Panel: No results for input(s): VITAMINB12, FOLATE, FERRITIN, TIBC, IRON, RETICCTPCT in the last 72  hours.  Urine analysis:    Component Value Date/Time   COLORURINE Isa (A) 04/08/2018 1031   APPEARANCEUR TURBID (A) 04/08/2018 1031   LABSPEC  04/08/2018 1031    TEST NOT REPORTED DUE TO COLOR INTERFERENCE OF URINE PIGMENT   PHURINE  04/08/2018 1031    TEST NOT REPORTED DUE TO  COLOR INTERFERENCE OF URINE PIGMENT   GLUCOSEU (A) 04/08/2018 1031    TEST NOT REPORTED DUE TO COLOR INTERFERENCE OF URINE PIGMENT   HGBUR (A) 04/08/2018 1031    TEST NOT REPORTED DUE TO COLOR INTERFERENCE OF URINE PIGMENT   BILIRUBINUR (A) 04/08/2018 1031    TEST NOT REPORTED DUE TO COLOR INTERFERENCE OF URINE PIGMENT   KETONESUR (A) 04/08/2018 1031    TEST NOT REPORTED DUE TO COLOR INTERFERENCE OF URINE PIGMENT   PROTEINUR (A) 04/08/2018 1031    TEST NOT REPORTED DUE TO COLOR INTERFERENCE OF URINE PIGMENT   UROBILINOGEN 4.0 (H) 04/09/2017 1638   NITRITE (A) 04/08/2018 1031    TEST NOT REPORTED DUE TO COLOR INTERFERENCE OF URINE PIGMENT   LEUKOCYTESUR (A) 04/08/2018 1031    TEST NOT REPORTED DUE TO COLOR INTERFERENCE OF URINE PIGMENT    Sepsis Labs: Lactic Acid, Venous    Component Value Date/Time   LATICACIDVEN 0.74 04/07/2018 2351    MICROBIOLOGY: Recent Results (from the past 240 hour(s))  Urine culture     Status: None   Collection Time: 04/07/18  8:42 PM  Result Value Ref Range Status   Specimen Description   Final    URINE, CATHETERIZED Performed at [facility], [place] [address] [place], [place] [address]    Special Requests   Final    NONE Performed at [facility], [place] [address] [place], [place] [address]    Culture   Final    NO GROWTH Performed at [facility], [place] [address], [place], [place] [address]    Report Status 04/09/2018 FINAL  Final  Blood Cultures (routine x 2)     Status: None (Preliminary result)   Collection Time: 04/07/18  9:39 PM  Result Value Ref Range Status   Specimen Description   Final    BLOOD RIGHT  ANTECUBITAL Performed at [facility] [address] [place], [place] [address]    Special Requests   Final    BOTTLES DRAWN AEROBIC AND ANAEROBIC Blood Culture adequate volume Performed at [facility] [address], [place], [place] [address]    Culture   Final    NO GROWTH 4 DAYS Performed at [facility], [place] [address], [place], [place] [address]    Report Status PENDING  Incomplete  Blood Cultures (routine x 2)     Status: None (Preliminary result)   Collection Time: 04/07/18  9:39 PM  Result Value Ref Range Status   Specimen Description   Final    BLOOD LEFT ANTECUBITAL Performed at [facility] [address] [place], [place] [address]    Special Requests   Final    AEROBIC BOTTLE ONLY Blood Culture adequate volume Performed at [facility] [address] [place], [place] [address]    Culture   Final    NO GROWTH 4 DAYS Performed at [facility], [place] [address], [place],  [address]    Report Status PENDING  Incomplete  MRSA PCR Screening     Status: None   Collection Time: 04/08/18  9:11 AM  Result Value Ref Range Status   MRSA by PCR NEGATIVE NEGATIVE Final    Comment:        The GeneXpert MRSA Assay (FDA approved for NASAL specimens only), is one component of a comprehensive MRSA colonization surveillance program. It is not intended to diagnose MRSA infection nor to guide  or monitor treatment for MRSA infections. Performed at [facility], [place] [address], [place], [place] [address]   C difficile quick scan w PCR reflex     Status: None   Collection Time: 04/08/18  4:28 PM  Result Value Ref Range Status   C Diff antigen NEGATIVE NEGATIVE Final   C Diff toxin NEGATIVE NEGATIVE Final   C Diff interpretation No C. difficile detected.  Final    Comment: Performed at [facility], [place] [address] [place], [place] [address]  Gastrointestinal Panel by  PCR , Stool     Status: None   Collection Time: 04/08/18  4:31 PM  Result Value Ref Range Status   Campylobacter species NOT DETECTED NOT DETECTED Final   Plesimonas shigelloides NOT DETECTED NOT DETECTED Final   Salmonella species NOT DETECTED NOT DETECTED Final   Yersinia enterocolitica NOT DETECTED NOT DETECTED Final   Vibrio species NOT DETECTED NOT DETECTED Final   Vibrio cholerae NOT DETECTED NOT DETECTED Final   Enteroaggregative E coli (EAEC) NOT DETECTED NOT DETECTED Final   Enteropathogenic E coli (EPEC) NOT DETECTED NOT DETECTED Final   Enterotoxigenic E coli (ETEC) NOT DETECTED NOT DETECTED Final   Shiga like toxin producing E coli (STEC) NOT DETECTED NOT DETECTED Final   Shigella/Enteroinvasive E coli (EIEC) NOT DETECTED NOT DETECTED Final   Cryptosporidium NOT DETECTED NOT DETECTED Final   Cyclospora cayetanensis NOT DETECTED NOT DETECTED Final   Entamoeba histolytica NOT DETECTED NOT DETECTED Final   Giardia lamblia NOT DETECTED NOT DETECTED Final   Adenovirus F40/41 NOT DETECTED NOT DETECTED Final   Astrovirus NOT DETECTED NOT DETECTED Final   Norovirus GI/GII NOT DETECTED NOT DETECTED Final   Rotavirus A NOT DETECTED NOT DETECTED Final   Sapovirus (I, II, IV, and V) NOT DETECTED NOT DETECTED Final    Comment: Performed at [facility], [place] [place], [place] [address]  Body fluid culture     Status: None (Preliminary result)   Collection Time: 04/09/18  5:02 PM  Result Value Ref Range Status   Specimen Description PERITONEAL  Final   Special Requests NONE  Final   Gram Stain   Final    ABUNDANT WBC PRESENT,BOTH PMN AND MONONUCLEAR NO ORGANISMS SEEN    Culture   Final    NO GROWTH 2 DAYS Performed at [facility], [address], [place],  [address]    Report Status PENDING  Incomplete    RADIOLOGY STUDIES/RESULTS: Ct Abdomen Pelvis Wo Contrast  Result Date: 04/08/2018 CLINICAL DATA:  Failure to thrive the past 4 days.  Jaundice and incontinence. Leukocytosis. EXAM: CT ABDOMEN AND PELVIS WITHOUT CONTRAST TECHNIQUE: Multidetector CT imaging of the abdomen and pelvis was performed following the standard protocol without IV contrast. COMPARISON:  05/09/2012 CT FINDINGS: Lower chest: Tip of central line catheter is seen in the proximal right atrium. Mild cardiomegaly without pericardial effusion. Bilateral small pleural effusions with atelectasis at each lung base. 6 mm nodular density in the right lower lobe adjacent to the major fissure, series 2/2. Hepatobiliary: Surface nodularity morphologically consistent with cirrhosis. Left hepatic lobe hypodense mass measuring 4.8 x 4.7 x 3.7 cm is noted, new since prior exam and concerning for hepatocellular carcinoma or potentially metastatic disease. The gallbladder is distended and contains intraluminal hyperdense noncalcified abnormalities. This appears to extend into the common duct and may be contributing to the patient's jaundice. Hyperdense biliary sludge, blood products or intraluminal gallbladder mass might account  for this appearance. Pancreas: The pancreas appears atrophic without ductal dilatation. Spleen: No splenomegaly. Adrenals/Urinary Tract: No adrenal mass. There is left adrenal limb thickening. Kidneys are visualized without nephrolithiasis nor obstructive uropathy. No discrete renal mass is identified. The urinary bladder is decompressed and somewhat thickened in appearance as a result. Stomach/Bowel: Physiologic distention of the stomach. No small bowel dilatation. No bowel obstruction. Sympathetic thickening of the intestine likely from surrounding ascites and edema. Circumferential rectal wall thickening possibly representing internal hemorrhoids. Vascular/Lymphatic: Aortoiliac atherosclerosis without aneurysm. Lymphadenopathy is difficult to assess given paucity intra-abdominal fat and lack of IV contrast. Reproductive: Calcified uterine fibroids are noted. No adnexal  mass is seen. Other: Moderate volume of ascites within the abdomen pelvis. Mild soft tissue anasarca. Musculoskeletal: No aggressive osseous lesions. Degenerative disc disease L5-S1. IMPRESSION: 1. Cardiomegaly with small bilateral pleural effusions. 6 mm nodular density in the right lower lobe adjacent to the major fissure. Non-contrast chest CT at 6-12 months is recommended. If the nodule is stable at time of repeat CT, then future CT at 18-24 months (from today's scan) is considered optional for low-risk patients, but is recommended for high-risk patients. This recommendation follows the consensus statement: Guidelines for Management of Incidental Pulmonary Nodules Detected on CT Images: From the Fleischner Society 2017; Radiology 2017; 284:228-243. 2. Morphologic appearance of cirrhosis with moderate ascites. A hypodense mass in the left hepatic lobe measuring 4.8 x 4.7 x 3.7 cm is new since prior comparison and concerning for neoplasm. 3. Gallbladder distention with hyperdense intraluminal masslike abnormalities noted within either representing tumefactive biliary sludge blood products or potentially intraluminal mass. The hyperdensity extends into what appears to be the common duct may be contributing to the patient's reported jaundice. 4. Nonobstructed, nondistended bowel. Circumferential thickening in the rectum may represent internal hemorrhoids. 5. Calcified uterine fibroids. 6. Moderate-to-marked aortoiliac atherosclerosis. Electronically Signed   By: Ashley Royalty M.D.   On: 04/08/2018 01:39   Dg Chest 1 View  Result Date: 04/07/2018 CLINICAL DATA:  Weakness.  HIV. EXAM: CHEST  1 VIEW COMPARISON:  01/26/2018. FINDINGS: AP portable supine radiograph demonstrates elevated RIGHT hemidiaphragm. Perihilar opacity is new/increased from priors, extending into the RIGHT-sided retrocardiac region, suspicious for RIGHT lower lobe pneumonia. Streaky LEFT basilar density probably atelectasis. No osseous findings of  significance. Previous cervical fusion. Normal heart size. IMPRESSION: Worsening aeration from priors. Query RIGHT perihilar pneumonia with volume loss. Electronically Signed   By: Staci Righter M.D.   On: 04/07/2018 22:49   Ct Head Wo Contrast  Result Date: 04/08/2018 CLINICAL DATA:  Altered level of consciousness EXAM: CT HEAD WITHOUT CONTRAST TECHNIQUE: Contiguous axial images were obtained from the base of the skull through the vertex without intravenous contrast. COMPARISON:  04/11/2014 FINDINGS: Brain: Mild age related involutional changes of the brain. Minimal small vessel ischemic disease of periventricular white matter. No large vascular territory infarct hemorrhage or midline shift. No intra-axial mass nor extra-axial fluid collections. Vascular: No hyperdense vessel sign. Skull: No skull fracture. Sinuses/Orbits: Clear paranasal sinuses and mastoids. Intact orbits and globes. Other: None IMPRESSION: Chronic minimal small vessel ischemic disease of periventricular white matter. No acute intracranial abnormality. Electronically Signed   By: Ashley Royalty M.D.   On: 04/08/2018 01:22   US Renal  Result Date: 04/08/2018 CLINICAL DATA:  67 year old female with a history of acute renal disease EXAM: RENAL / URINARY TRACT ULTRASOUND COMPLETE COMPARISON:  None. FINDINGS: Right Kidney: Length: 13.3 cm. Echogenicity within normal limits. No mass or hydronephrosis visualized. Left Kidney:  Length: 12.2 cm. Echogenicity within normal limits. No mass or hydronephrosis visualized. Additional: Multiple mixed echogenicity liver masses are present. Ascites. Bladder: Appears normal for degree of bladder distention. IMPRESSION: No evidence of hydronephrosis. Multiple liver masses, compatible with metastatic disease. Ascites. These results were called by telephone at the time of interpretation on 04/08/2018 at 1:37 pm to Marni Griffon who verbally acknowledged these results. Electronically Signed   By: Corrie Mckusick D.O.    On: 04/08/2018 13:39   Mr Abdomen Mrcp Wo Contrast  Result Date: 04/09/2018 CLINICAL DATA:  Abnormal LFTs EXAM: MRI ABDOMEN WITHOUT CONTRAST  (INCLUDING MRCP) TECHNIQUE: Multiplanar multisequence MR imaging of the abdomen was performed. Heavily T2-weighted images of the biliary and pancreatic ducts were obtained, and three-dimensional MRCP images were rendered by post processing. COMPARISON:  CT abdomen/pelvis dated 04/08/2017 FINDINGS: Severely motion degraded images. Lower chest: Small bilateral pleural effusions. Associated lower lobe opacities, likely atelectasis. Hepatobiliary: Numerous hepatic metastases, measuring up to 5.0 cm in segment 4A (series 4001/image 11) and 6.3 cm in segment 5 (series 4001/image 16). Distended gallbladder with cholelithiasis. Intrahepatic and extrahepatic ductal dilatation, with layering sludge/gallstones within the distal common duct (series 8001/image 21). Pancreas: Mass-like enlargement of the pancreatic tail, measuring 2.7 x 5.3 cm (series 4001/image 17). Spleen:  Within normal limits. Adrenals/Urinary Tract:  Adrenal glands are grossly unremarkable. Kidneys are within normal limits.  No hydronephrosis. Stomach/Bowel: 2.4 cm cystic lesion along the gastric cardia (series 4001/image 20). Stomach is otherwise unremarkable. Visualized bowel is grossly unremarkable. Vascular/Lymphatic:  No evidence of abdominal aortic aneurysm. 1.6 cm short axis portacaval node (series 8001/image 20). Other:  Moderate abdominal ascites. Suspected peritoneal disease/omental caking beneath the left mid anterior abdominal wall (series 8001/image 25), although poorly evaluated. Musculoskeletal: Heterogeneous marrow signal in the visualized thoracolumbar spine, metastases not excluded. IMPRESSION: Limited evaluation due to severe motion degradation and lack of intravenous contrast administration. Mass-like enlargement of the pancreatic tail, measuring 2.7 x 5.3 cm, worrisome for primary pancreatic  neoplasm given additional findings. 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 Moderate abdominal ascites. Suspected peritoneal disease/omental caking beneath the left mid anterior abdominal wall. 1.6 cm short axis portacaval node. Distended gallbladder with cholelithiasis. Intrahepatic and extrahepatic ductal dilatation with layering sludge/stones in the common duct. Electronically Signed   By: Julian Hy M.D.   On: 04/09/2018 12:26   Mr 3d Recon At Scanner  Result Date: 04/09/2018 CLINICAL DATA:  Abnormal LFTs EXAM: MRI ABDOMEN WITHOUT CONTRAST  (INCLUDING MRCP) TECHNIQUE: Multiplanar multisequence MR imaging of the abdomen was performed. Heavily T2-weighted images of the biliary and pancreatic ducts were obtained, and three-dimensional MRCP images were rendered by post processing. COMPARISON:  CT abdomen/pelvis dated 04/08/2017 FINDINGS: Severely motion degraded images. Lower chest: Small bilateral pleural effusions. Associated lower lobe opacities, likely atelectasis. Hepatobiliary: Numerous hepatic metastases, measuring up to 5.0 cm in segment 4A (series 4001/image 11) and 6.3 cm in segment 5 (series 4001/image 16). Distended gallbladder with cholelithiasis. Intrahepatic and extrahepatic ductal dilatation, with layering sludge/gallstones within the distal common duct (series 8001/image 21). Pancreas: Mass-like enlargement of the pancreatic tail, measuring 2.7 x 5.3 cm (series 4001/image 17). Spleen:  Within normal limits. Adrenals/Urinary Tract:  Adrenal glands are grossly unremarkable. Kidneys are within normal limits.  No hydronephrosis. Stomach/Bowel: 2.4 cm cystic lesion along the gastric cardia (series 4001/image 20). Stomach is otherwise unremarkable. Visualized bowel is grossly unremarkable. Vascular/Lymphatic:  No evidence of abdominal aortic aneurysm. 1.6 cm short axis portacaval node (series 8001/image 20).  Other:  Moderate abdominal ascites. Suspected peritoneal  disease/omental caking beneath the left mid anterior abdominal wall (series 8001/image 25), although poorly evaluated. Musculoskeletal: Heterogeneous marrow signal in the visualized thoracolumbar spine, metastases not excluded. IMPRESSION: Limited evaluation due to severe motion degradation and lack of intravenous contrast administration. Mass-like enlargement of the pancreatic tail, measuring 2.7 x 5.3 cm, worrisome for primary pancreatic neoplasm given additional findings. 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 Moderate abdominal ascites. Suspected peritoneal disease/omental caking beneath the left mid anterior abdominal wall. 1.6 cm short axis portacaval node. Distended gallbladder with cholelithiasis. Intrahepatic and extrahepatic ductal dilatation with layering sludge/stones in the common duct. Electronically Signed   By: Julian Hy M.D.   On: 04/09/2018 12:26   Dg Chest Port 1 View  Result Date: 04/09/2018 CLINICAL DATA:  Pneumonia. EXAM: PORTABLE CHEST 1 VIEW COMPARISON:  04/08/2018 FINDINGS: The patient is mildly rotated to the right. Right jugular catheter terminates over the lower SVC. The cardiomediastinal silhouette is unchanged allowing for patient rotation. The lungs are hypoinflated. Right infrahilar airspace opacity is partially obscured by superimposition of the heart due to patient rotation but has not grossly worsened. There is mild patchy and linear opacity in the left lung base which is stable to slightly increased. Mild hazy perihilar opacity is present bilaterally, and the interstitial markings are mildly accentuated diffusely. There may be tiny bilateral pleural effusions. No pneumothorax is identified. IMPRESSION: 1. Low lung volumes with patchy bibasilar opacities which are stable to slightly worsened and which may reflect pneumonia or atelectasis. 2. Hazy perihilar opacities and interstitial prominence may reflect mild edema with tiny pleural  effusions. Electronically Signed   By: Logan Bores M.D.   On: 04/09/2018 11:36   Dg Chest Port 1 View  Result Date: 04/08/2018 CLINICAL DATA:  Central line placement EXAM: PORTABLE CHEST 1 VIEW COMPARISON:  04/07/2018 FINDINGS: Right IJ approach central line catheter is seen in the proximal right atrium. No pneumothorax. Heart size is normal. There is mild ectasia of the thoracic aorta. Slight improvement in aeration about the perihilar airspace opacity on the right. Bibasilar atelectasis is noted, left greater than right. ACDF of the included mid and lower cervical spine. IMPRESSION: New right IJ central line catheter is seen in the proximal right atrium without pneumothorax. Slightly improved aeration with respect infrahilar pulmonary consolidation noted previously. Electronically Signed   By: Ashley Royalty M.D.   On: 04/08/2018 01:21     LOS: 3 days   Oren Binet, MD  [facility] Pager:[phone]  If 7PM-7AM, please contact night-coverage [email or web address] Password [email or web address] 04/11/2018, 1:10 PM

## 2018-04-11 NOTE — Progress Notes (Signed)
Spoke with patient's sister over the phone and then in the conference room. Patient's niece was also present.  We went over patient numerous medical issues-as outlined in my progress note-patient apparently has been not acting right for the past 2 months, family noted yellow colored sclera a few weeks back. Per patient's sister-patient has been refusing to seek medical attention, and has been "ready to die for the past 2 months". We discussed numerous options-including comfort measures.   After much discussion with family- DNR reaffirmed Family open to transition to comfort measures-starting today and ok with transfer to residential hospice Goals of care is mostly for comfort at this time.  Total time spent=47 min Time in 1:58 pm Time out 2:45 pm

## 2018-04-11 NOTE — Progress Notes (Signed)
eLink Physician-Brief Progress Note Patient Name: [name] DOB: 06/10/1951 MRN: [id number]   Date of Service  04/11/2018  HPI/Events of Note  Hypokalemia, hypophos, relatively low mag  eICU Interventions  Potassium, phos, and mag replaced     Intervention Category Intermediate Interventions: Electrolyte abnormality - evaluation and management  DETERDING,ELIZABETH 04/11/2018, 5:38 AM

## 2018-04-12 LAB — CBC
HEMATOCRIT: 18.1 % — AB (ref 36.0–46.0)
HEMOGLOBIN: 5.9 g/dL — AB (ref 12.0–15.0)
MCH: 31.2 pg (ref 26.0–34.0)
MCHC: 32.6 g/dL (ref 30.0–36.0)
MCV: 95.8 fL (ref 78.0–100.0)
Platelets: 100 10*3/uL — ABNORMAL LOW (ref 150–400)
RBC: 1.89 MIL/uL — ABNORMAL LOW (ref 3.87–5.11)
RDW: 21.2 % — AB (ref 11.5–15.5)
WBC: 34.2 10*3/uL — AB (ref 4.0–10.5)

## 2018-04-12 LAB — GLUCOSE, CAPILLARY
GLUCOSE-CAPILLARY: 106 mg/dL — AB (ref 65–99)
Glucose-Capillary: 114 mg/dL — ABNORMAL HIGH (ref 65–99)

## 2018-04-12 LAB — CULTURE, BLOOD (ROUTINE X 2)
Culture: NO GROWTH
Culture: NO GROWTH
SPECIAL REQUESTS: ADEQUATE
Special Requests: ADEQUATE

## 2018-04-12 LAB — HEPATIC FUNCTION PANEL
ALBUMIN: 1.5 g/dL — AB (ref 3.5–5.0)
ALT: 43 U/L (ref 14–54)
AST: 135 U/L — AB (ref 15–41)
Alkaline Phosphatase: 506 U/L — ABNORMAL HIGH (ref 38–126)
Total Bilirubin: 47.5 mg/dL (ref 0.3–1.2)
Total Protein: 5 g/dL — ABNORMAL LOW (ref 6.5–8.1)

## 2018-04-12 LAB — RENAL FUNCTION PANEL
ALBUMIN: 1.5 g/dL — AB (ref 3.5–5.0)
ANION GAP: 17 — AB (ref 5–15)
BUN: 32 mg/dL — ABNORMAL HIGH (ref 6–20)
CALCIUM: 6.5 mg/dL — AB (ref 8.9–10.3)
CO2: 35 mmol/L — ABNORMAL HIGH (ref 22–32)
Chloride: 89 mmol/L — ABNORMAL LOW (ref 101–111)
Creatinine, Ser: 3.79 mg/dL — ABNORMAL HIGH (ref 0.44–1.00)
GFR, EST AFRICAN AMERICAN: 13 mL/min — AB (ref >60)
GFR, EST NON AFRICAN AMERICAN: 12 mL/min — AB (ref >60)
Glucose, Bld: 110 mg/dL — ABNORMAL HIGH (ref 65–99)
PHOSPHORUS: 5.6 mg/dL — AB (ref 2.5–4.6)
POTASSIUM: 3.2 mmol/L — AB (ref 3.5–5.1)
SODIUM: 141 mmol/L (ref 135–145)

## 2018-04-12 LAB — BASIC METABOLIC PANEL
ANION GAP: 16 — AB (ref 5–15)
BUN: 33 mg/dL — AB (ref 6–20)
CALCIUM: 6.5 mg/dL — AB (ref 8.9–10.3)
CO2: 36 mmol/L — ABNORMAL HIGH (ref 22–32)
Chloride: 90 mmol/L — ABNORMAL LOW (ref 101–111)
Creatinine, Ser: 3.86 mg/dL — ABNORMAL HIGH (ref 0.44–1.00)
GFR calc Af Amer: 13 mL/min — ABNORMAL LOW (ref >60)
GFR, EST NON AFRICAN AMERICAN: 11 mL/min — AB (ref >60)
Glucose, Bld: 113 mg/dL — ABNORMAL HIGH (ref 65–99)
POTASSIUM: 3.2 mmol/L — AB (ref 3.5–5.1)
SODIUM: 142 mmol/L (ref 135–145)

## 2018-04-12 LAB — PROTIME-INR
INR: 2.91
PROTHROMBIN TIME: 30.2 s — AB (ref 11.4–15.2)

## 2018-04-12 LAB — MAGNESIUM: Magnesium: 2.2 mg/dL (ref 1.7–2.4)

## 2018-04-12 LAB — PREPARE RBC (CROSSMATCH)

## 2018-04-12 LAB — AMMONIA: AMMONIA: 27 umol/L (ref 9–35)

## 2018-04-12 MED ORDER — SODIUM CHLORIDE 0.9% FLUSH: 10.0000 mL | INTRAVENOUS | Status: DC | PRN

## 2018-04-12 MED ORDER — SODIUM CHLORIDE 0.9 % IV SOLN: Freq: Once | INTRAVENOUS | Status: DC

## 2018-04-12 MED ORDER — MORPHINE SULFATE (CONCENTRATE) 10 MG/0.5ML PO SOLN
10.0000 mg | ORAL | Status: DC | PRN
  Administered 2018-04-12: 10 mg via ORAL
  Filled 2018-04-12: qty 0.5

## 2018-04-12 MED ORDER — HYDROMORPHONE HCL 2 MG/ML IJ SOLN: 1.0000 mg | INTRAMUSCULAR | 0 refills | Status: AC | PRN

## 2018-04-12 MED ORDER — MORPHINE SULFATE (CONCENTRATE) 10 MG/0.5ML PO SOLN: 10.0000 mg | ORAL | Status: AC | PRN

## 2018-04-12 MED ORDER — LORAZEPAM 2 MG/ML IJ SOLN: 1.0000 mg | INTRAMUSCULAR | 0 refills | Status: AC | PRN

## 2018-04-12 NOTE — Progress Notes (Signed)
PATIENT WAS TRANSFERRED TO [facility] VIA [facility]

## 2018-04-12 NOTE — Social Work (Signed)
Bed available for discharge to [facility] today, DNR in drawer on hard chart to sign.  MD paged via Amion for discharge summary and signature on DNR.  Alexander Mt, [place] Work [phone]

## 2018-04-12 NOTE — Progress Notes (Addendum)
Report called to Doreen Salvage, RN at [facility].  Awaiting IV team to remove central lines for discharge.  [name]

## 2018-04-12 NOTE — Care Management Important Message (Signed)
Important Message  Patient Details  Name: [name] MRN: [id number] Date of Birth: Jul 09, 1951   Medicare Important Message Given:  No  Due to illness patient not able to sign?Unsigned copy left at bedside Orbie Pyo 04/12/2018, 3:33 PM

## 2018-04-12 NOTE — Discharge Summary (Signed)
PATIENT DETAILS Name: [name] Age: 67 y.o. Sex: female Date of Birth: 1951-07-07 MRN: [id number]. Admitting Physician: Kandice Hams, MD IWO:EHOZY, Henderson Newcomer, MD  Admit Date: 04/07/2018 Discharge date: 04/12/2018  Recommendations for Outpatient Follow-up:  1. Being transferred [facility] for [facility] care.  Admitted From:  Home  Disposition: [place]: No  Equipment/Devices: None  Discharge Condition: hospice)  CODE STATUS: DNR/ Comfort Care  Diet recommendation:  Comfort feeds  Brief Summary: See H&P, Labs, Consult and Test reports for all details in brief, Patient is a 67 y.o. with prior history of HIV on antiretroviral therapy, thyroid cancer status post thyroidectomy 2014, cocaine use, anxiety/depression presented to the ED on 5/8 with septic shock, hypovolemia, acute renal failure and profound jaundice along with encephalopathy.  She was initially managed in the ICU-he required pressors and broad-spectrum antimicrobial therapy.  She was evaluated by infectious disease, gastroenterology and nephrology services.  She was briefly started on CRRT service with some improvement.  Upon some clinical stability-she was transferred to the hospitalist service on 5/12.    Hospitalist MD spoke with patient's family-given very poor overall prognosis-recommendations were to transfer to residential hospice-which the family agreed.  See below for further details.    Brief Hospital Course: Septic shock: Source likely biliary in etiology-given choledocholithiasis.  Doubt SBP based on ascites fluid analysis.  Although somewhat improved with stable blood pressure she continues to be very tenuous.    She was initially managed with broad-spectrum antimicrobial therapy but then transition to Unasyn.  Unfortunately-given her very tenuous condition-gastroenterology felt that even doing endoscopic evaluation for biliary decompression etc. was not likely  going to benefit this patient.  This MD subsequently had a long discussion with the patient's sister and niece on 5/12-explained very tenuous situation with multiple medical issues as outlined in this note-extra extensive discussion-family agreed to transition to full comfort measures.  Per patient's family-patient really did not even want to come to the hospital.  Per social work-bed available at [facility]-we will transfer today for continued comfort care.    Acute kidney injury: Likely hemodynamically mediated-nephrology following-briefly required CRRT in the ICU-has adequate urine output and relatively stable electrolytes-renal function continues to worsen.  Clearly not a candidate for long-term hemodialysis.    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  Her overall prognosis is very poor-I suspect even with biliary decompression/biopsy etc-will not change outcome in this scenario.  As noted above, after extensive discussion with family she is being transitioned to comfort measures.  She did undergo a paracentesis while she was in the ICU-cytology is currently pending.   Acute metabolic encephalopathy: Secondary to sepsis/renal failure/jaundice-remains lethargic and confused this morning-although able to follow some commands.  CT of the head negative for acute abnormalities on admission.  Levels were not elevated.  Type II non-STEMI/demand ischemia: Clearly not a candidate for any sort of cardiac evaluation in her present condition..  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  Not a candidate for any sort of evaluation present condition.  Anemia: Suspect secondary to chronic disease-worsened by critical  illness-status post 3 units of PRBC so far-hemoglobin still very low-given desire to transition to comfort measures-no indication to transfuse at this time.  Transfer to [facility].  Hypokalemia: Repleted  Oral thrush:  Managed with Diflucan-this is being discontinued on discharge.  HIV:  Since being transitioned to comfort care-no plans to resume antiretrovirals.   Chronic pain syndrome with bilateral avascular necrosis of the hip: supportive care  Palliative care: DNR in place-unfortunate 67 year old with HIV-presented with septic shock/encephalopathy/acute renal failure/profound jaundice-further evaluation revealed cirrhosis, pancreatic mass with liver and omental metastases and choledocholithiasis.  Her prognosis is dismal-spoke with nephews at bedside yesterday morning, subsequently spoke with the patient's sister and niece in the conference room yesterday-after extensive discussion-we have transition her to full comfort measures.  Residential hospice on discharge.    Procedures/Studies: 5/10>> paracentesis  Discharge Diagnoses:  Active Problems:   Septic shock (HCC)   Acute renal disease   Acute respiratory failure with hypoxemia (HCC)   Acute renal failure (HCC)   Pressure injury of skin   Discharge Instructions:  Activity:  As tolerated   Allergies as of 04/12/2018      Reactions   Sulfa Antibiotics Hives, Other (See Comments)   "in my shoulders; legs; feet; made me burn all over"      Medication List    STOP taking these medications   albuterol 108 (90 Base) MCG/ACT inhaler Commonly known as:  PROVENTIL HFA;VENTOLIN HFA   atazanavir-cobicistat 300-150 MG tablet Commonly known as:  EVOTAZ   citalopram 20 MG tablet Commonly known as:  CELEXA   emtricitabine-tenofovir AF 200-25 MG tablet Commonly known as:  DESCOVY   gabapentin 300 MG capsule Commonly known as:  NEURONTIN   ibuprofen 600 MG tablet Commonly known as:  ADVIL,MOTRIN   megestrol 20 MG  tablet Commonly known as:  MEGACE   naproxen sodium 220 MG tablet Commonly known as:  ALEVE   SYNTHROID 150 MCG tablet Generic drug:  levothyroxine     TAKE these medications   HYDROmorphone 2 MG/ML injection Commonly known as:  DILAUDID Inject 0.5 mLs (1 mg total) into the vein every 3 (three) hours as needed (comfort care).   LORazepam 2 MG/ML injection Commonly known as:  ATIVAN Inject 0.5 mLs (1 mg total) into the vein every 4 (four) hours as needed (comfort care).   morphine CONCENTRATE 10 MG/0.5ML Soln concentrated solution Take 0.5 mLs (10 mg total) by mouth every 2 (two) hours as needed for moderate pain or shortness of breath.      Follow-up Information    Elwyn Reach, MD Follow up.   Specialty:  Internal Medicine Why:  as needed Contact information: [address] [place] [address] [phone]          Allergies  Allergen Reactions  . Sulfa Antibiotics Hives and Other (See Comments)    "in my shoulders; legs; feet; made me burn all over"      Consultations:  GI PCCM ID Renal      Other Procedures/Studies: Ct Abdomen Pelvis Wo Contrast  Result Date: 04/08/2018 CLINICAL DATA:  Failure to thrive the past 4 days. Jaundice and incontinence. Leukocytosis. EXAM: CT ABDOMEN AND PELVIS WITHOUT CONTRAST TECHNIQUE: Multidetector CT imaging of the abdomen and pelvis was performed following the standard protocol without IV contrast. COMPARISON:  05/09/2012 CT FINDINGS: Lower chest: Tip of central line catheter is seen in the proximal right atrium. Mild cardiomegaly without pericardial effusion. Bilateral small pleural effusions with atelectasis at each lung base. 6 mm nodular density in the right lower lobe adjacent to the major fissure, series 2/2. Hepatobiliary: Surface nodularity morphologically consistent with cirrhosis. Left hepatic lobe hypodense  mass measuring 4.8 x 4.7 x 3.7 cm is noted, new since prior exam and concerning for  hepatocellular carcinoma or potentially metastatic disease. The gallbladder is distended and contains intraluminal hyperdense noncalcified abnormalities. This appears to extend into the common duct and may be contributing to the patient's jaundice. Hyperdense biliary sludge, blood products or intraluminal gallbladder mass might account for this appearance. Pancreas: The pancreas appears atrophic without ductal dilatation. Spleen: No splenomegaly. Adrenals/Urinary Tract: No adrenal mass. There is left adrenal limb thickening. Kidneys are visualized without nephrolithiasis nor obstructive uropathy. No discrete renal mass is identified. The urinary bladder is decompressed and somewhat thickened in appearance as a result. Stomach/Bowel: Physiologic distention of the stomach. No small bowel dilatation. No bowel obstruction. Sympathetic thickening of the intestine likely from surrounding ascites and edema. Circumferential rectal wall thickening possibly representing internal hemorrhoids. Vascular/Lymphatic: Aortoiliac atherosclerosis without aneurysm. Lymphadenopathy is difficult to assess given paucity intra-abdominal fat and lack of IV contrast. Reproductive: Calcified uterine fibroids are noted. No adnexal mass is seen. Other: Moderate volume of ascites within the abdomen pelvis. Mild soft tissue anasarca. Musculoskeletal: No aggressive osseous lesions. Degenerative disc disease L5-S1. IMPRESSION: 1. Cardiomegaly with small bilateral pleural effusions. 6 mm nodular density in the right lower lobe adjacent to the major fissure. Non-contrast chest CT at 6-12 months is recommended. If the nodule is stable at time of repeat CT, then future CT at 18-24 months (from today's scan) is considered optional for low-risk patients, but is recommended for high-risk patients. This recommendation follows the consensus statement: Guidelines for Management of Incidental Pulmonary Nodules Detected on CT Images: From the Fleischner  Society 2017; Radiology 2017; 284:228-243. 2. Morphologic appearance of cirrhosis with moderate ascites. A hypodense mass in the left hepatic lobe measuring 4.8 x 4.7 x 3.7 cm is new since prior comparison and concerning for neoplasm. 3. Gallbladder distention with hyperdense intraluminal masslike abnormalities noted within either representing tumefactive biliary sludge blood products or potentially intraluminal mass. The hyperdensity extends into what appears to be the common duct may be contributing to the patient's reported jaundice. 4. Nonobstructed, nondistended bowel. Circumferential thickening in the rectum may represent internal hemorrhoids. 5. Calcified uterine fibroids. 6. Moderate-to-marked aortoiliac atherosclerosis. Electronically Signed   By: Ashley Royalty M.D.   On: 04/08/2018 01:39   Dg Chest 1 View  Result Date: 04/07/2018 CLINICAL DATA:  Weakness.  HIV. EXAM: CHEST  1 VIEW COMPARISON:  01/26/2018. FINDINGS: AP portable supine radiograph demonstrates elevated RIGHT hemidiaphragm. Perihilar opacity is new/increased from priors, extending into the RIGHT-sided retrocardiac region, suspicious for RIGHT lower lobe pneumonia. Streaky LEFT basilar density probably atelectasis. No osseous findings of significance. Previous cervical fusion. Normal heart size. IMPRESSION: Worsening aeration from priors. Query RIGHT perihilar pneumonia with volume loss. Electronically Signed   By: Staci Righter M.D.   On: 04/07/2018 22:49   Ct Head Wo Contrast  Result Date: 04/08/2018 CLINICAL DATA:  Altered level of consciousness EXAM: CT HEAD WITHOUT CONTRAST TECHNIQUE: Contiguous axial images were obtained from the base of the skull through the vertex without intravenous contrast. COMPARISON:  04/11/2014 FINDINGS: Brain: Mild age related involutional changes of the brain. Minimal small vessel ischemic disease of periventricular white matter. No large vascular territory infarct hemorrhage or midline shift. No intra-axial  mass nor extra-axial fluid collections. Vascular: No hyperdense vessel sign. Skull: No skull fracture. Sinuses/Orbits: Clear paranasal sinuses and mastoids. Intact orbits and globes. Other: None IMPRESSION: Chronic minimal small vessel ischemic disease of periventricular white matter. No acute intracranial abnormality.  Electronically Signed   By: Ashley Royalty M.D.   On: 04/08/2018 01:22   US Renal  Result Date: 04/08/2018 CLINICAL DATA:  67 year old female with a history of acute renal disease EXAM: RENAL / URINARY TRACT ULTRASOUND COMPLETE COMPARISON:  None. FINDINGS: Right Kidney: Length: 13.3 cm. Echogenicity within normal limits. No mass or hydronephrosis visualized. Left Kidney: Length: 12.2 cm. Echogenicity within normal limits. No mass or hydronephrosis visualized. Additional: Multiple mixed echogenicity liver masses are present. Ascites. Bladder: Appears normal for degree of bladder distention. IMPRESSION: No evidence of hydronephrosis. Multiple liver masses, compatible with metastatic disease. Ascites. These results were called by telephone at the time of interpretation on 04/08/2018 at 1:37 pm to Marni Griffon who verbally acknowledged these results. Electronically Signed   By: Corrie Mckusick D.O.   On: 04/08/2018 13:39   Mr Abdomen Mrcp Wo Contrast  Result Date: 04/09/2018 CLINICAL DATA:  Abnormal LFTs EXAM: MRI ABDOMEN WITHOUT CONTRAST  (INCLUDING MRCP) TECHNIQUE: Multiplanar multisequence MR imaging of the abdomen was performed. Heavily T2-weighted images of the biliary and pancreatic ducts were obtained, and three-dimensional MRCP images were rendered by post processing. COMPARISON:  CT abdomen/pelvis dated 04/08/2017 FINDINGS: Severely motion degraded images. Lower chest: Small bilateral pleural effusions. Associated lower lobe opacities, likely atelectasis. Hepatobiliary: Numerous hepatic metastases, measuring up to 5.0 cm in segment 4A (series 4001/image 11) and 6.3 cm in segment 5 (series  4001/image 16). Distended gallbladder with cholelithiasis. Intrahepatic and extrahepatic ductal dilatation, with layering sludge/gallstones within the distal common duct (series 8001/image 21). Pancreas: Mass-like enlargement of the pancreatic tail, measuring 2.7 x 5.3 cm (series 4001/image 17). Spleen:  Within normal limits. Adrenals/Urinary Tract:  Adrenal glands are grossly unremarkable. Kidneys are within normal limits.  No hydronephrosis. Stomach/Bowel: 2.4 cm cystic lesion along the gastric cardia (series 4001/image 20). Stomach is otherwise unremarkable. Visualized bowel is grossly unremarkable. Vascular/Lymphatic:  No evidence of abdominal aortic aneurysm. 1.6 cm short axis portacaval node (series 8001/image 20). Other:  Moderate abdominal ascites. Suspected peritoneal disease/omental caking beneath the left mid anterior abdominal wall (series 8001/image 25), although poorly evaluated. Musculoskeletal: Heterogeneous marrow signal in the visualized thoracolumbar spine, metastases not excluded. IMPRESSION: Limited evaluation due to severe motion degradation and lack of intravenous contrast administration. Mass-like enlargement of the pancreatic tail, measuring 2.7 x 5.3 cm, worrisome for primary pancreatic neoplasm given additional findings. 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 Moderate abdominal ascites. Suspected peritoneal disease/omental caking beneath the left mid anterior abdominal wall. 1.6 cm short axis portacaval node. Distended gallbladder with cholelithiasis. Intrahepatic and extrahepatic ductal dilatation with layering sludge/stones in the common duct. Electronically Signed   By: Julian Hy M.D.   On: 04/09/2018 12:26   Mr 3d Recon At Scanner  Result Date: 04/09/2018 CLINICAL DATA:  Abnormal LFTs EXAM: MRI ABDOMEN WITHOUT CONTRAST  (INCLUDING MRCP) TECHNIQUE: Multiplanar multisequence MR imaging of the abdomen was performed. Heavily T2-weighted images of the  biliary and pancreatic ducts were obtained, and three-dimensional MRCP images were rendered by post processing. COMPARISON:  CT abdomen/pelvis dated 04/08/2017 FINDINGS: Severely motion degraded images. Lower chest: Small bilateral pleural effusions. Associated lower lobe opacities, likely atelectasis. Hepatobiliary: Numerous hepatic metastases, measuring up to 5.0 cm in segment 4A (series 4001/image 11) and 6.3 cm in segment 5 (series 4001/image 16). Distended gallbladder with cholelithiasis. Intrahepatic and extrahepatic ductal dilatation, with layering sludge/gallstones within the distal common duct (series 8001/image 21). Pancreas: Mass-like enlargement of the pancreatic tail, measuring 2.7 x 5.3 cm (series  4001/image 17). Spleen:  Within normal limits. Adrenals/Urinary Tract:  Adrenal glands are grossly unremarkable. Kidneys are within normal limits.  No hydronephrosis. Stomach/Bowel: 2.4 cm cystic lesion along the gastric cardia (series 4001/image 20). Stomach is otherwise unremarkable. Visualized bowel is grossly unremarkable. Vascular/Lymphatic:  No evidence of abdominal aortic aneurysm. 1.6 cm short axis portacaval node (series 8001/image 20). Other:  Moderate abdominal ascites. Suspected peritoneal disease/omental caking beneath the left mid anterior abdominal wall (series 8001/image 25), although poorly evaluated. Musculoskeletal: Heterogeneous marrow signal in the visualized thoracolumbar spine, metastases not excluded. IMPRESSION: Limited evaluation due to severe motion degradation and lack of intravenous contrast administration. Mass-like enlargement of the pancreatic tail, measuring 2.7 x 5.3 cm, worrisome for primary pancreatic neoplasm given additional findings. 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 Moderate abdominal ascites. Suspected peritoneal disease/omental caking beneath the left mid anterior abdominal wall. 1.6 cm short axis portacaval node. Distended gallbladder  with cholelithiasis. Intrahepatic and extrahepatic ductal dilatation with layering sludge/stones in the common duct. Electronically Signed   By: Julian Hy M.D.   On: 04/09/2018 12:26   Dg Chest Port 1 View  Result Date: 04/09/2018 CLINICAL DATA:  Pneumonia. EXAM: PORTABLE CHEST 1 VIEW COMPARISON:  04/08/2018 FINDINGS: The patient is mildly rotated to the right. Right jugular catheter terminates over the lower SVC. The cardiomediastinal silhouette is unchanged allowing for patient rotation. The lungs are hypoinflated. Right infrahilar airspace opacity is partially obscured by superimposition of the heart due to patient rotation but has not grossly worsened. There is mild patchy and linear opacity in the left lung base which is stable to slightly increased. Mild hazy perihilar opacity is present bilaterally, and the interstitial markings are mildly accentuated diffusely. There may be tiny bilateral pleural effusions. No pneumothorax is identified. IMPRESSION: 1. Low lung volumes with patchy bibasilar opacities which are stable to slightly worsened and which may reflect pneumonia or atelectasis. 2. Hazy perihilar opacities and interstitial prominence may reflect mild edema with tiny pleural effusions. Electronically Signed   By: Logan Bores M.D.   On: 04/09/2018 11:36   Dg Chest Port 1 View  Result Date: 04/08/2018 CLINICAL DATA:  Central line placement EXAM: PORTABLE CHEST 1 VIEW COMPARISON:  04/07/2018 FINDINGS: Right IJ approach central line catheter is seen in the proximal right atrium. No pneumothorax. Heart size is normal. There is mild ectasia of the thoracic aorta. Slight improvement in aeration about the perihilar airspace opacity on the right. Bibasilar atelectasis is noted, left greater than right. ACDF of the included mid and lower cervical spine. IMPRESSION: New right IJ central line catheter is seen in the proximal right atrium without pneumothorax. Slightly improved aeration with respect  infrahilar pulmonary consolidation noted previously. Electronically Signed   By: Ashley Royalty M.D.   On: 04/08/2018 01:21      TODAY-DAY OF DISCHARGE:  Subjective:   [name] today tends to be very lethargic and confused  Objective:   Blood pressure 107/76, pulse 74, temperature 97.8 F (36.6 C), temperature source Oral, resp. rate 16, height 5\' 8"  (1.727 m), weight 66.5 kg (146 lb 9.7 oz), SpO2 96 %.  Intake/Output Summary (Last 24 hours) at 04/12/2018 1009 Last data filed at 04/12/2018 0938 Gross per 24 hour  Intake 1182.5 ml  Output 2250 ml  Net -1067.5 ml   Filed Weights   04/10/18 0400 04/11/18 0500 04/12/18 0914  Weight: 61.5 kg (135 lb 9.3 oz) 65.6 kg (144 lb 10 oz) 66.5 kg (146 lb  9.7 oz)    Exam: Although awake-lethargic and confused. Supple Neck,No JVD Symmetrical Chest wall movement, Good air movement bilaterally, CTAB RRR,No Gallops,Rubs or new Murmurs,  +ve B.Sounds, Abd Soft, and distended  PERTINENT RADIOLOGIC STUDIES: Ct Abdomen Pelvis Wo Contrast  Result Date: 04/08/2018 CLINICAL DATA:  Failure to thrive the past 4 days. Jaundice and incontinence. Leukocytosis. EXAM: CT ABDOMEN AND PELVIS WITHOUT CONTRAST TECHNIQUE: Multidetector CT imaging of the abdomen and pelvis was performed following the standard protocol without IV contrast. COMPARISON:  05/09/2012 CT FINDINGS: Lower chest: Tip of central line catheter is seen in the proximal right atrium. Mild cardiomegaly without pericardial effusion. Bilateral small pleural effusions with atelectasis at each lung base. 6 mm nodular density in the right lower lobe adjacent to the major fissure, series 2/2. Hepatobiliary: Surface nodularity morphologically consistent with cirrhosis. Left hepatic lobe hypodense mass measuring 4.8 x 4.7 x 3.7 cm is noted, new since prior exam and concerning for hepatocellular carcinoma or potentially metastatic disease. The gallbladder is distended and contains intraluminal hyperdense  noncalcified abnormalities. This appears to extend into the common duct and may be contributing to the patient's jaundice. Hyperdense biliary sludge, blood products or intraluminal gallbladder mass might account for this appearance. Pancreas: The pancreas appears atrophic without ductal dilatation. Spleen: No splenomegaly. Adrenals/Urinary Tract: No adrenal mass. There is left adrenal limb thickening. Kidneys are visualized without nephrolithiasis nor obstructive uropathy. No discrete renal mass is identified. The urinary bladder is decompressed and somewhat thickened in appearance as a result. Stomach/Bowel: Physiologic distention of the stomach. No small bowel dilatation. No bowel obstruction. Sympathetic thickening of the intestine likely from surrounding ascites and edema. Circumferential rectal wall thickening possibly representing internal hemorrhoids. Vascular/Lymphatic: Aortoiliac atherosclerosis without aneurysm. Lymphadenopathy is difficult to assess given paucity intra-abdominal fat and lack of IV contrast. Reproductive: Calcified uterine fibroids are noted. No adnexal mass is seen. Other: Moderate volume of ascites within the abdomen pelvis. Mild soft tissue anasarca. Musculoskeletal: No aggressive osseous lesions. Degenerative disc disease L5-S1. IMPRESSION: 1. Cardiomegaly with small bilateral pleural effusions. 6 mm nodular density in the right lower lobe adjacent to the major fissure. Non-contrast chest CT at 6-12 months is recommended. If the nodule is stable at time of repeat CT, then future CT at 18-24 months (from today's scan) is considered optional for low-risk patients, but is recommended for high-risk patients. This recommendation follows the consensus statement: Guidelines for Management of Incidental Pulmonary Nodules Detected on CT Images: From the Fleischner Society 2017; Radiology 2017; 284:228-243. 2. Morphologic appearance of cirrhosis with moderate ascites. A hypodense mass in the  left hepatic lobe measuring 4.8 x 4.7 x 3.7 cm is new since prior comparison and concerning for neoplasm. 3. Gallbladder distention with hyperdense intraluminal masslike abnormalities noted within either representing tumefactive biliary sludge blood products or potentially intraluminal mass. The hyperdensity extends into what appears to be the common duct may be contributing to the patient's reported jaundice. 4. Nonobstructed, nondistended bowel. Circumferential thickening in the rectum may represent internal hemorrhoids. 5. Calcified uterine fibroids. 6. Moderate-to-marked aortoiliac atherosclerosis. Electronically Signed   By: Ashley Royalty M.D.   On: 04/08/2018 01:39   Dg Chest 1 View  Result Date: 04/07/2018 CLINICAL DATA:  Weakness.  HIV. EXAM: CHEST  1 VIEW COMPARISON:  01/26/2018. FINDINGS: AP portable supine radiograph demonstrates elevated RIGHT hemidiaphragm. Perihilar opacity is new/increased from priors, extending into the RIGHT-sided retrocardiac region, suspicious for RIGHT lower lobe pneumonia. Streaky LEFT basilar density probably atelectasis. No osseous findings of significance.  Previous cervical fusion. Normal heart size. IMPRESSION: Worsening aeration from priors. Query RIGHT perihilar pneumonia with volume loss. Electronically Signed   By: Staci Righter M.D.   On: 04/07/2018 22:49   Ct Head Wo Contrast  Result Date: 04/08/2018 CLINICAL DATA:  Altered level of consciousness EXAM: CT HEAD WITHOUT CONTRAST TECHNIQUE: Contiguous axial images were obtained from the base of the skull through the vertex without intravenous contrast. COMPARISON:  04/11/2014 FINDINGS: Brain: Mild age related involutional changes of the brain. Minimal small vessel ischemic disease of periventricular white matter. No large vascular territory infarct hemorrhage or midline shift. No intra-axial mass nor extra-axial fluid collections. Vascular: No hyperdense vessel sign. Skull: No skull fracture. Sinuses/Orbits: Clear  paranasal sinuses and mastoids. Intact orbits and globes. Other: None IMPRESSION: Chronic minimal small vessel ischemic disease of periventricular white matter. No acute intracranial abnormality. Electronically Signed   By: Ashley Royalty M.D.   On: 04/08/2018 01:22   US Renal  Result Date: 04/08/2018 CLINICAL DATA:  67 year old female with a history of acute renal disease EXAM: RENAL / URINARY TRACT ULTRASOUND COMPLETE COMPARISON:  None. FINDINGS: Right Kidney: Length: 13.3 cm. Echogenicity within normal limits. No mass or hydronephrosis visualized. Left Kidney: Length: 12.2 cm. Echogenicity within normal limits. No mass or hydronephrosis visualized. Additional: Multiple mixed echogenicity liver masses are present. Ascites. Bladder: Appears normal for degree of bladder distention. IMPRESSION: No evidence of hydronephrosis. Multiple liver masses, compatible with metastatic disease. Ascites. These results were called by telephone at the time of interpretation on 04/08/2018 at 1:37 pm to Marni Griffon who verbally acknowledged these results. Electronically Signed   By: Corrie Mckusick D.O.   On: 04/08/2018 13:39   Mr Abdomen Mrcp Wo Contrast  Result Date: 04/09/2018 CLINICAL DATA:  Abnormal LFTs EXAM: MRI ABDOMEN WITHOUT CONTRAST  (INCLUDING MRCP) TECHNIQUE: Multiplanar multisequence MR imaging of the abdomen was performed. Heavily T2-weighted images of the biliary and pancreatic ducts were obtained, and three-dimensional MRCP images were rendered by post processing. COMPARISON:  CT abdomen/pelvis dated 04/08/2017 FINDINGS: Severely motion degraded images. Lower chest: Small bilateral pleural effusions. Associated lower lobe opacities, likely atelectasis. Hepatobiliary: Numerous hepatic metastases, measuring up to 5.0 cm in segment 4A (series 4001/image 11) and 6.3 cm in segment 5 (series 4001/image 16). Distended gallbladder with cholelithiasis. Intrahepatic and extrahepatic ductal dilatation, with layering  sludge/gallstones within the distal common duct (series 8001/image 21). Pancreas: Mass-like enlargement of the pancreatic tail, measuring 2.7 x 5.3 cm (series 4001/image 17). Spleen:  Within normal limits. Adrenals/Urinary Tract:  Adrenal glands are grossly unremarkable. Kidneys are within normal limits.  No hydronephrosis. Stomach/Bowel: 2.4 cm cystic lesion along the gastric cardia (series 4001/image 20). Stomach is otherwise unremarkable. Visualized bowel is grossly unremarkable. Vascular/Lymphatic:  No evidence of abdominal aortic aneurysm. 1.6 cm short axis portacaval node (series 8001/image 20). Other:  Moderate abdominal ascites. Suspected peritoneal disease/omental caking beneath the left mid anterior abdominal wall (series 8001/image 25), although poorly evaluated. Musculoskeletal: Heterogeneous marrow signal in the visualized thoracolumbar spine, metastases not excluded. IMPRESSION: Limited evaluation due to severe motion degradation and lack of intravenous contrast administration. Mass-like enlargement of the pancreatic tail, measuring 2.7 x 5.3 cm, worrisome for primary pancreatic neoplasm given additional findings. 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 Moderate abdominal ascites. Suspected peritoneal disease/omental caking beneath the left mid anterior abdominal wall. 1.6 cm short axis portacaval node. Distended gallbladder with cholelithiasis. Intrahepatic and extrahepatic ductal dilatation with layering sludge/stones in the common duct. Electronically Signed  By: Julian Hy M.D.   On: 04/09/2018 12:26   Mr 3d Recon At Scanner  Result Date: 04/09/2018 CLINICAL DATA:  Abnormal LFTs EXAM: MRI ABDOMEN WITHOUT CONTRAST  (INCLUDING MRCP) TECHNIQUE: Multiplanar multisequence MR imaging of the abdomen was performed. Heavily T2-weighted images of the biliary and pancreatic ducts were obtained, and three-dimensional MRCP images were rendered by post processing.  COMPARISON:  CT abdomen/pelvis dated 04/08/2017 FINDINGS: Severely motion degraded images. Lower chest: Small bilateral pleural effusions. Associated lower lobe opacities, likely atelectasis. Hepatobiliary: Numerous hepatic metastases, measuring up to 5.0 cm in segment 4A (series 4001/image 11) and 6.3 cm in segment 5 (series 4001/image 16). Distended gallbladder with cholelithiasis. Intrahepatic and extrahepatic ductal dilatation, with layering sludge/gallstones within the distal common duct (series 8001/image 21). Pancreas: Mass-like enlargement of the pancreatic tail, measuring 2.7 x 5.3 cm (series 4001/image 17). Spleen:  Within normal limits. Adrenals/Urinary Tract:  Adrenal glands are grossly unremarkable. Kidneys are within normal limits.  No hydronephrosis. Stomach/Bowel: 2.4 cm cystic lesion along the gastric cardia (series 4001/image 20). Stomach is otherwise unremarkable. Visualized bowel is grossly unremarkable. Vascular/Lymphatic:  No evidence of abdominal aortic aneurysm. 1.6 cm short axis portacaval node (series 8001/image 20). Other:  Moderate abdominal ascites. Suspected peritoneal disease/omental caking beneath the left mid anterior abdominal wall (series 8001/image 25), although poorly evaluated. Musculoskeletal: Heterogeneous marrow signal in the visualized thoracolumbar spine, metastases not excluded. IMPRESSION: Limited evaluation due to severe motion degradation and lack of intravenous contrast administration. Mass-like enlargement of the pancreatic tail, measuring 2.7 x 5.3 cm, worrisome for primary pancreatic neoplasm given additional findings. 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 Moderate abdominal ascites. Suspected peritoneal disease/omental caking beneath the left mid anterior abdominal wall. 1.6 cm short axis portacaval node. Distended gallbladder with cholelithiasis. Intrahepatic and extrahepatic ductal dilatation with layering sludge/stones in the common  duct. Electronically Signed   By: Julian Hy M.D.   On: 04/09/2018 12:26   Dg Chest Port 1 View  Result Date: 04/09/2018 CLINICAL DATA:  Pneumonia. EXAM: PORTABLE CHEST 1 VIEW COMPARISON:  04/08/2018 FINDINGS: The patient is mildly rotated to the right. Right jugular catheter terminates over the lower SVC. The cardiomediastinal silhouette is unchanged allowing for patient rotation. The lungs are hypoinflated. Right infrahilar airspace opacity is partially obscured by superimposition of the heart due to patient rotation but has not grossly worsened. There is mild patchy and linear opacity in the left lung base which is stable to slightly increased. Mild hazy perihilar opacity is present bilaterally, and the interstitial markings are mildly accentuated diffusely. There may be tiny bilateral pleural effusions. No pneumothorax is identified. IMPRESSION: 1. Low lung volumes with patchy bibasilar opacities which are stable to slightly worsened and which may reflect pneumonia or atelectasis. 2. Hazy perihilar opacities and interstitial prominence may reflect mild edema with tiny pleural effusions. Electronically Signed   By: Logan Bores M.D.   On: 04/09/2018 11:36   Dg Chest Port 1 View  Result Date: 04/08/2018 CLINICAL DATA:  Central line placement EXAM: PORTABLE CHEST 1 VIEW COMPARISON:  04/07/2018 FINDINGS: Right IJ approach central line catheter is seen in the proximal right atrium. No pneumothorax. Heart size is normal. There is mild ectasia of the thoracic aorta. Slight improvement in aeration about the perihilar airspace opacity on the right. Bibasilar atelectasis is noted, left greater than right. ACDF of the included mid and lower cervical spine. IMPRESSION: New right IJ central line catheter is seen in the proximal right atrium  without pneumothorax. Slightly improved aeration with respect infrahilar pulmonary consolidation noted previously. Electronically Signed   By: Ashley Royalty M.D.   On:  04/08/2018 01:21     PERTINENT LAB RESULTS: CBC: Recent Labs    04/12/18 0413  WBC 34.2*  HGB 5.9*  HCT 18.1*  PLT 100*   CMET CMP     Component Value Date/Time   NA 141 04/12/2018 0309   NA 142 04/12/2018 0309   K 3.2 (L) 04/12/2018 0309   K 3.2 (L) 04/12/2018 0309   CL 89 (L) 04/12/2018 0309   CL 90 (L) 04/12/2018 0309   CO2 35 (H) 04/12/2018 0309   CO2 36 (H) 04/12/2018 0309   GLUCOSE 110 (H) 04/12/2018 0309   GLUCOSE 113 (H) 04/12/2018 0309   BUN 32 (H) 04/12/2018 0309   BUN 33 (H) 04/12/2018 0309   CREATININE 3.79 (H) 04/12/2018 0309   CREATININE 3.86 (H) 04/12/2018 0309   CREATININE 1.62 (H) 09/10/2017 0952   CALCIUM 6.5 (L) 04/12/2018 0309   CALCIUM 6.5 (L) 04/12/2018 0309   PROT 5.0 (L) 04/12/2018 0309   ALBUMIN 1.5 (L) 04/12/2018 0309   ALBUMIN 1.5 (L) 04/12/2018 0309   AST 135 (H) 04/12/2018 0309   ALT 43 04/12/2018 0309   ALKPHOS 506 (H) 04/12/2018 0309   BILITOT 47.5 (HH) 04/12/2018 0309   GFRNONAA 12 (L) 04/12/2018 0309   GFRNONAA 11 (L) 04/12/2018 0309   GFRNONAA 31 (L) 04/13/2017 1050   GFRAA 13 (L) 04/12/2018 0309   GFRAA 13 (L) 04/12/2018 0309   GFRAA 35 (L) 04/13/2017 1050    GFR Estimated Creatinine Clearance: 14.9 mL/min (A) (by C-G formula based on SCr of 3.79 mg/dL (H)). No results for input(s): LIPASE, AMYLASE in the last 72 hours. No results for input(s): CKTOTAL, CKMB, CKMBINDEX, TROPONINI in the last 72 hours. Invalid input(s): POCBNP No results for input(s): DDIMER in the last 72 hours. No results for input(s): HGBA1C in the last 72 hours. No results for input(s): CHOL, HDL, LDLCALC, TRIG, CHOLHDL, LDLDIRECT in the last 72 hours. No results for input(s): TSH, T4TOTAL, T3FREE, THYROIDAB in the last 72 hours.  Invalid input(s): FREET3 No results for input(s): VITAMINB12, FOLATE, FERRITIN, TIBC, IRON, RETICCTPCT in the last 72 hours. Coags: Recent Labs    04/12/18 0309  INR 2.91   Microbiology: Recent Results (from the past  240 hour(s))  Urine culture     Status: None   Collection Time: 04/07/18  8:42 PM  Result Value Ref Range Status   Specimen Description   Final    URINE, CATHETERIZED Performed at [facility] [address] [place], [place] [address]    Special Requests   Final    NONE Performed at [facility], [place] [address] [place], [place] [address]    Culture   Final    NO GROWTH Performed at [facility], [place] [address] [place], [place] [address]    Report Status 04/09/2018 FINAL  Final  Blood Cultures (routine x 2)     Status: None (Preliminary result)   Collection Time: 04/07/18  9:39 PM  Result Value Ref Range Status   Specimen Description   Final    BLOOD RIGHT ANTECUBITAL Performed at [facility] [address], [place], [place] [address]    Special Requests   Final    BOTTLES DRAWN AEROBIC AND ANAEROBIC Blood Culture adequate volume Performed at [facility] [address] [place],  [place] [address]    Culture   Final    NO GROWTH 4 DAYS Performed at [facility], [place] [address], [place], [place] [address]    Report Status PENDING  Incomplete  Blood Cultures (routine x 2)     Status: None (Preliminary result)   Collection Time: 04/07/18  9:39 PM  Result Value Ref Range Status   Specimen Description   Final    BLOOD LEFT ANTECUBITAL Performed at [facility] [address], [place], [place] [address]    Special Requests   Final    AEROBIC BOTTLE ONLY Blood Culture adequate volume Performed at [facility] [address], [place], [place] [address]    Culture   Final    NO GROWTH 4 DAYS Performed at [facility], [place] [address] [place], [place] [address]    Report Status PENDING  Incomplete  MRSA PCR Screening     Status: None   Collection Time: 04/08/18  9:11 AM  Result Value Ref Range Status   MRSA by PCR NEGATIVE NEGATIVE Final     Comment:        The GeneXpert MRSA Assay (FDA approved for NASAL specimens only), is one component of a comprehensive MRSA colonization surveillance program. It is not intended to diagnose MRSA infection nor to guide or monitor treatment for MRSA infections. Performed at [facility], [place] [address] [place], [place] [address]   C difficile quick scan w PCR reflex     Status: None   Collection Time: 04/08/18  4:28 PM  Result Value Ref Range Status   C Diff antigen NEGATIVE NEGATIVE Final   C Diff toxin NEGATIVE NEGATIVE Final   C Diff interpretation No C. difficile detected.  Final    Comment: Performed at [facility], [place] [address] [place], [place] [address]  Gastrointestinal Panel by PCR , Stool     Status: None   Collection Time: 04/08/18  4:31 PM  Result Value Ref Range Status   Campylobacter species NOT DETECTED NOT DETECTED Final   Plesimonas shigelloides NOT DETECTED NOT DETECTED Final   Salmonella species NOT DETECTED NOT DETECTED Final   Yersinia enterocolitica NOT DETECTED NOT DETECTED Final   Vibrio species NOT DETECTED NOT DETECTED Final   Vibrio cholerae NOT DETECTED NOT DETECTED Final   Enteroaggregative E coli (EAEC) NOT DETECTED NOT DETECTED Final   Enteropathogenic E coli (EPEC) NOT DETECTED NOT DETECTED Final   Enterotoxigenic E coli (ETEC) NOT DETECTED NOT DETECTED Final   Shiga like toxin producing E coli (STEC) NOT DETECTED NOT DETECTED Final   Shigella/Enteroinvasive E coli (EIEC) NOT DETECTED NOT DETECTED Final   Cryptosporidium NOT DETECTED NOT DETECTED Final   Cyclospora cayetanensis NOT DETECTED NOT DETECTED Final   Entamoeba histolytica NOT DETECTED NOT DETECTED Final   Giardia lamblia NOT DETECTED NOT DETECTED Final   Adenovirus F40/41 NOT DETECTED NOT DETECTED Final   Astrovirus NOT DETECTED NOT DETECTED Final   Norovirus GI/GII NOT DETECTED NOT DETECTED Final   Rotavirus A NOT DETECTED NOT DETECTED Final   Sapovirus (I, II, IV,  and V) NOT DETECTED NOT DETECTED Final    Comment: Performed at [facility], [place]., [place],  [address]  Body fluid culture     Status: None (Preliminary result)   Collection Time: 04/09/18  5:02 PM  Result Value Ref Range Status   Specimen Description PERITONEAL  Final   Special Requests  NONE  Final   Gram Stain   Final    ABUNDANT WBC PRESENT,BOTH PMN AND MONONUCLEAR NO ORGANISMS SEEN    Culture   Final    NO GROWTH 2 DAYS Performed at [facility], [address] [place], [place] [address]    Report Status PENDING  Incomplete    FURTHER DISCHARGE INSTRUCTIONS:  Get Medicines reviewed and adjusted: Please take all your medications with you for your next visit with your Primary MD  Laboratory/radiological data: Please request your Primary MD to go over all hospital tests and procedure/radiological results at the follow up, please ask your Primary MD to get all Hospital records sent to his/her office.  In some cases, they will be blood work, cultures and biopsy results pending at the time of your discharge. Please request that your primary care M.D. goes through all the records of your hospital data and follows up on these results.  Also Note the following: If you experience worsening of your admission symptoms, develop shortness of breath, life threatening emergency, suicidal or homicidal thoughts you must seek medical attention immediately by calling 911 or calling your MD immediately  if symptoms less severe.  You must read complete instructions/literature along with all the possible adverse reactions/side effects for all the Medicines you take and that have been prescribed to you. Take any new Medicines after you have completely understood and accpet all the possible adverse reactions/side effects.   Do not drive when taking Pain medications or sleeping medications (Benzodaizepines)  Do not take more than prescribed Pain, Sleep and Anxiety  Medications. It is not advisable to combine anxiety,sleep and pain medications without talking with your primary care practitioner  Special Instructions: If you have smoked or chewed Tobacco  in the last 2 yrs please stop smoking, stop any regular Alcohol  and or any Recreational drug use.  Wear Seat belts while driving.  Please note: You were cared for by a hospitalist during your hospital stay. Once you are discharged, your primary care physician will handle any further medical issues. Please note that NO REFILLS for any discharge medications will be authorized once you are discharged, as it is imperative that you return to your primary care physician (or establish a relationship with a primary care physician if you do not have one) for your post hospital discharge needs so that they can reassess your need for medications and monitor your lab values.  Total Time spent coordinating discharge including counseling, education and face to face time equals 35 min  Signed: Shanker Ghimire 04/12/2018 10:09 AM

## 2018-04-12 NOTE — Progress Notes (Signed)
Received request from [facility] for family interest in [facility] with request for transfer today. Chart reviewed. Met with sister, Caren Griffins, to confirm interest and explain services. Family agreeable to transfer today. Michiel Cowboy, CSW is aware. Registration paperwork completed. Dr. Orpah Melter to assume care per family request. Please fax discharge summary to [phone]. RN please call report to [phone]. Please arrange transport for patient to arrive as soon as possible.  Please call with any hospice related questions or concerns.  Thank you for this referral.  Charlett Blake, CCM [facility] Liaison [place] are on [facility].

## 2018-04-12 NOTE — Progress Notes (Signed)
Palliative Medicine RN Note: Consult order noted, but patient is already set up to go to [facility] today. Because goals are clear and hospice is already in place, we will not see this patient. Order will be cancelled.  Marjie Skiff Carmelle Bamberg, RN, BSN, [facility] Palliative Medicine Team 04/12/2018 10:06 AM Office [phone]

## 2018-04-12 NOTE — Progress Notes (Signed)
Nutrition Brief Note  Chart reviewed. Pt now transitioning to comfort care.  No further nutrition interventions warranted at this time.  Please re-consult as needed.   Zehava Turski A. Sonja Manseau, RD, LDN, CDE Pager: [phone] After hours Pager: [phone]  

## 2018-04-12 NOTE — Social Work (Addendum)
Clinical Social Worker facilitated patient discharge including contacting patient family and facility to confirm patient discharge plans.   Clinical information faxed to facility and family agreeable with plan.   CSW will arrange ambulance transport via [facility] to [facility]. [facility] called for 3pm pick up.    RN to call [phone] with report  prior to discharge.  Clinical Social Worker will sign off for now as social work intervention is no longer needed. Please consult [place] again if new need arises.  Alexander Mt, [place] Social Worker [phone]

## 2018-04-13 LAB — TYPE AND SCREEN
ABO/RH(D): O POS
Antibody Screen: NEGATIVE
UNIT DIVISION: 0
Unit division: 0

## 2018-04-13 LAB — BPAM RBC
Blood Product Expiration Date: 201905142359
Blood Product Expiration Date: 201906052359
ISSUE DATE / TIME: 201905111421
ISSUE DATE / TIME: 201905131031
UNIT TYPE AND RH: 9500
Unit Type and Rh: 5100

## 2018-04-13 LAB — BODY FLUID CULTURE: Culture: NO GROWTH

## 2018-05-01 DEATH — deceased

## 2018-05-29 IMAGING — DX DG CHEST 1V PORT
1 series · 1 of 1 positions shown · non-contrast
Comparison: 04/07/2018

CLINICAL DATA: Central line placement

EXAM:
PORTABLE CHEST 1 VIEW

[chest ap]
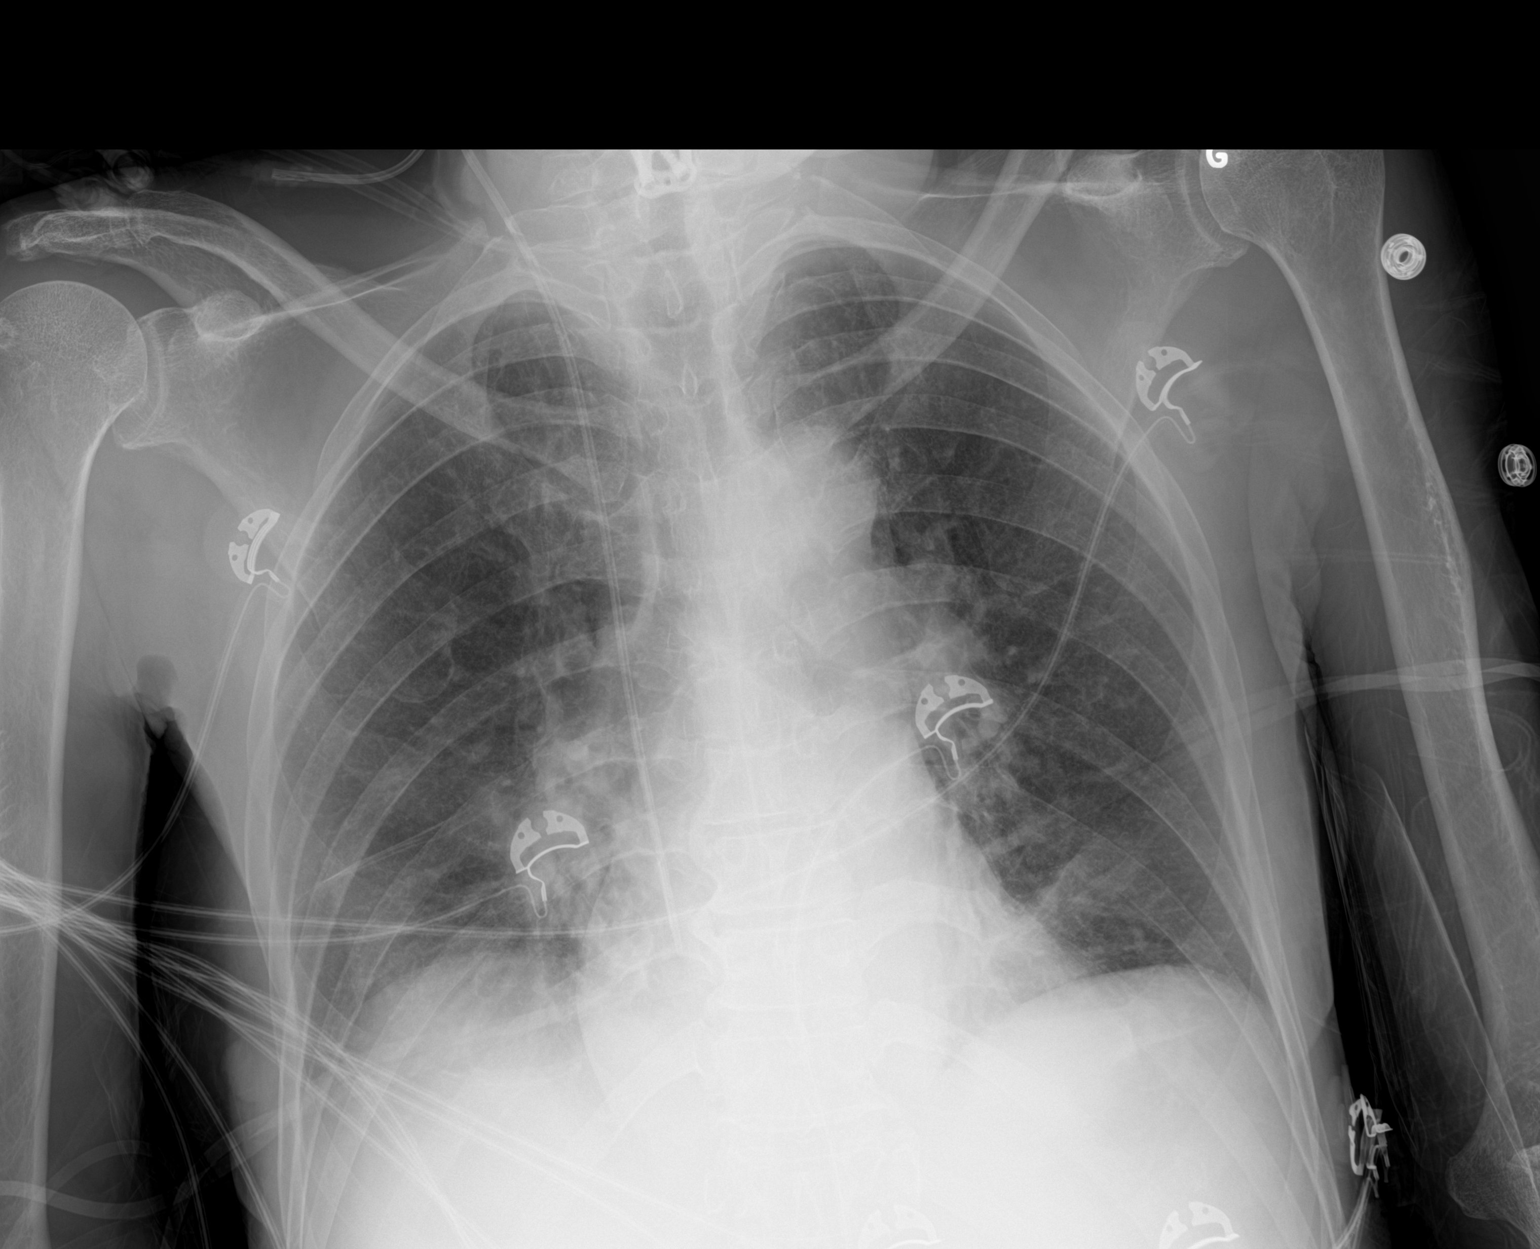

[1 of 1 positions shown; findings below may reference images not displayed]

FINDINGS: Right IJ approach central line catheter is seen in the proximal
right atrium. No pneumothorax. Heart size is normal. There is mild
ectasia of the thoracic aorta. Slight improvement in aeration about
the perihilar airspace opacity on the right. Bibasilar atelectasis
is noted, left greater than right. ACDF of the included mid and
lower cervical spine.
IMPRESSION: New right IJ central line catheter is seen in the proximal right
atrium without pneumothorax. Slightly improved aeration with respect
infrahilar pulmonary consolidation noted previously.

## 2018-05-29 IMAGING — CT CT HEAD W/O CM
3 series · 15 of 47 positions shown, 18 images · non-contrast
Comparison: 04/11/2014

CLINICAL DATA: Altered level of consciousness

EXAM:
CT HEAD WITHOUT CONTRAST
TECHNIQUE: Contiguous axial images were obtained from the base of the skull
through the vertex without intravenous contrast.

[Series 2: head wo · axial · 0.47mm/px · z∈[-48,+77]mm · 9 of 31 slices shown, 12 images]
[im 3/31  brain]
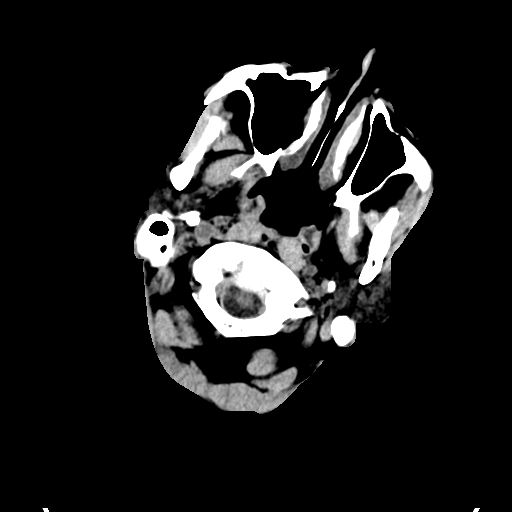
[im 3/31  bone]
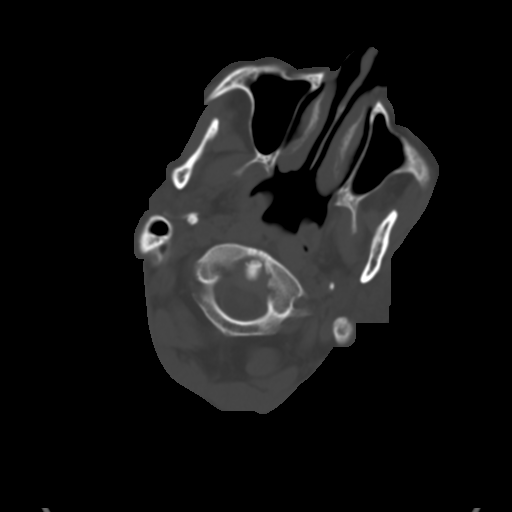
[im 6/31  brain]
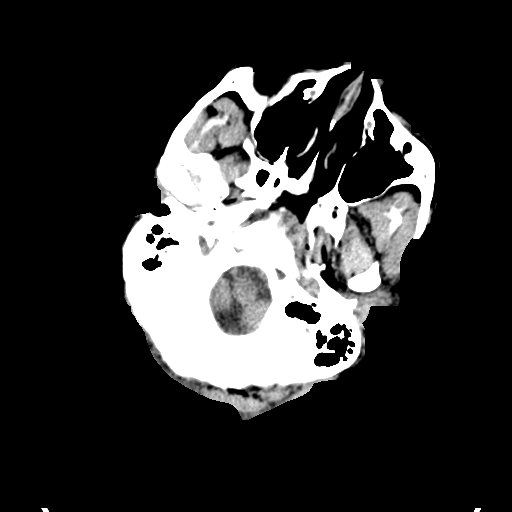
[im 9/31  brain]
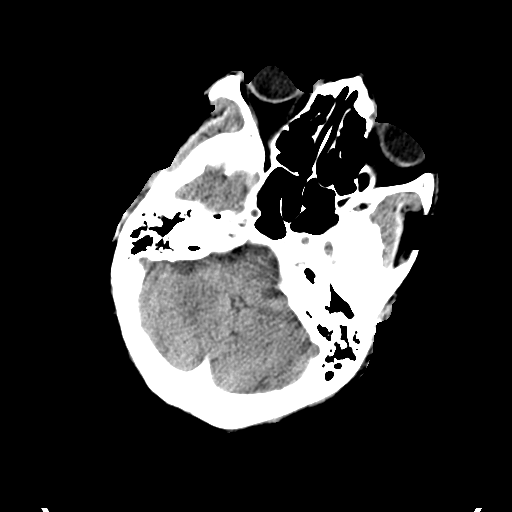
[im 12/31  brain]
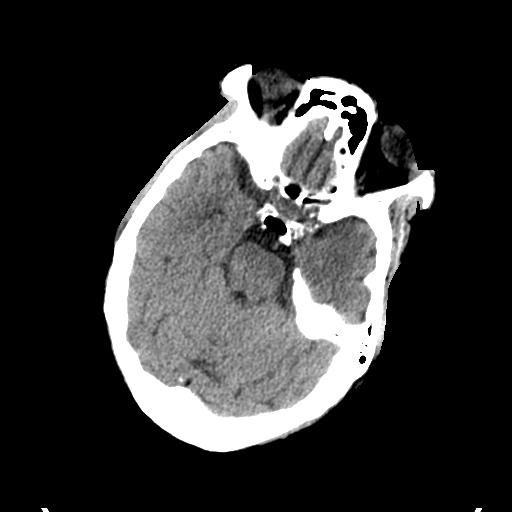
[im 16/31  brain]
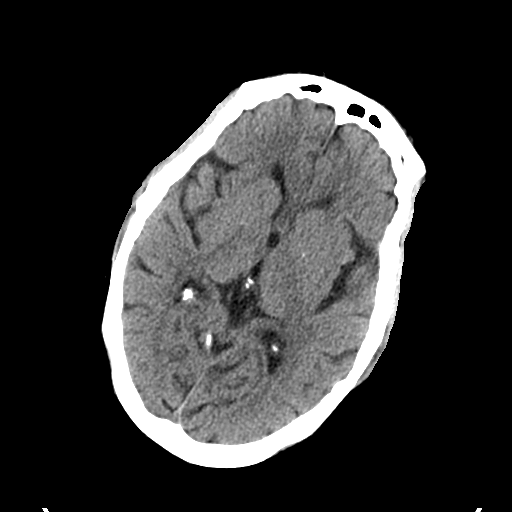
[im 16/31  bone]
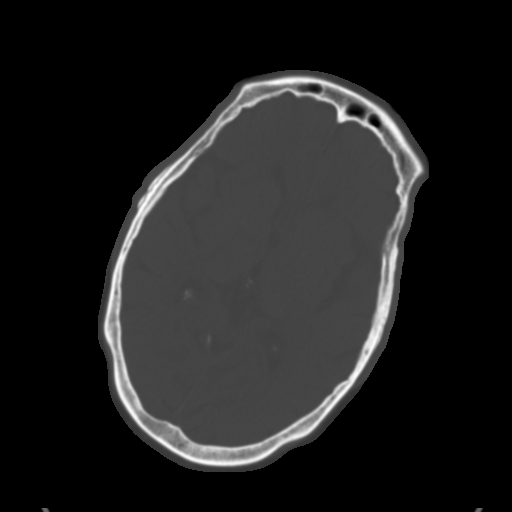
[im 19/31  brain]
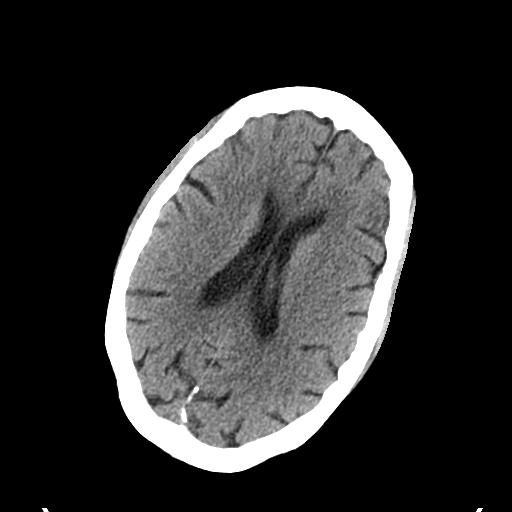
[im 22/31  brain]
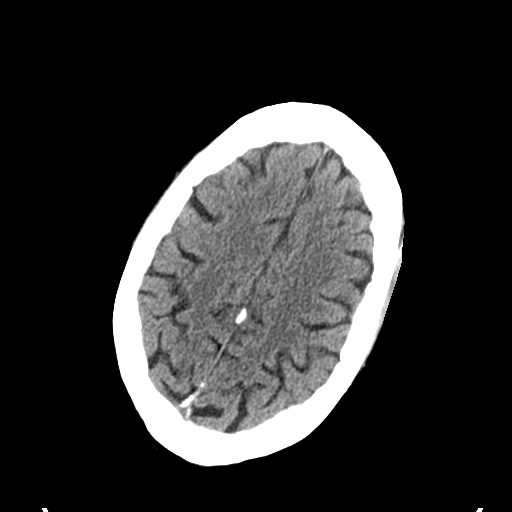
[im 25/31  brain]
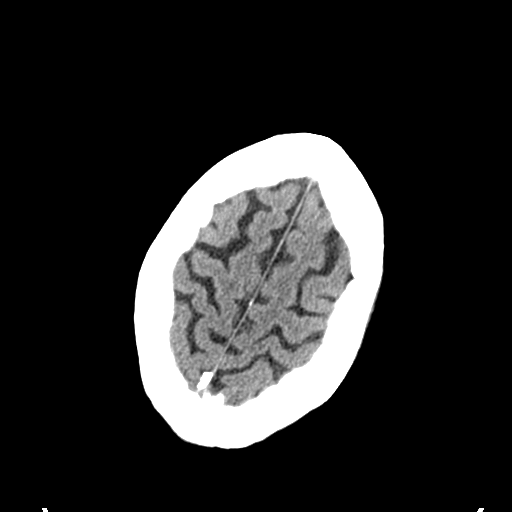
[im 28/31  brain]
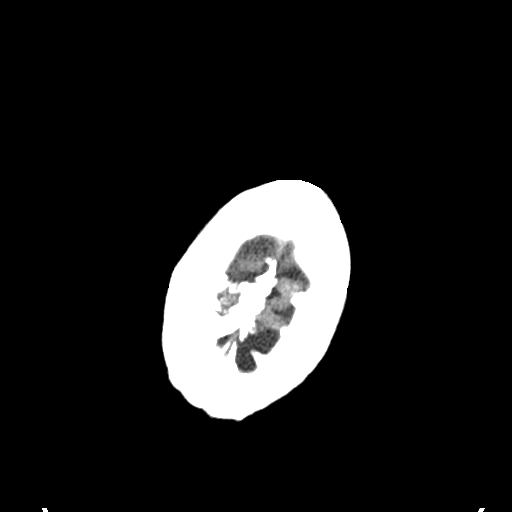
[im 28/31  bone]
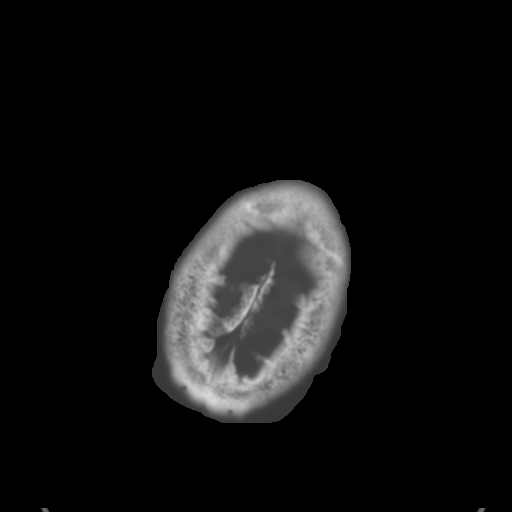

[Series 4: coronal soft tissue · coronal · 0.26mm/px · 3 of 65 slices shown]
[im 22/65  brain]
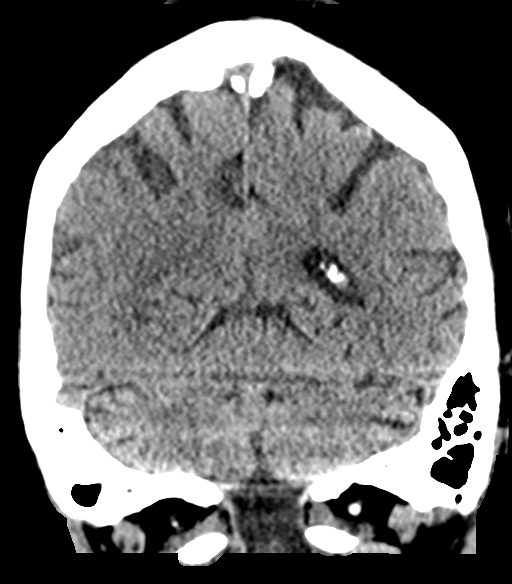
[im 29/65  brain]
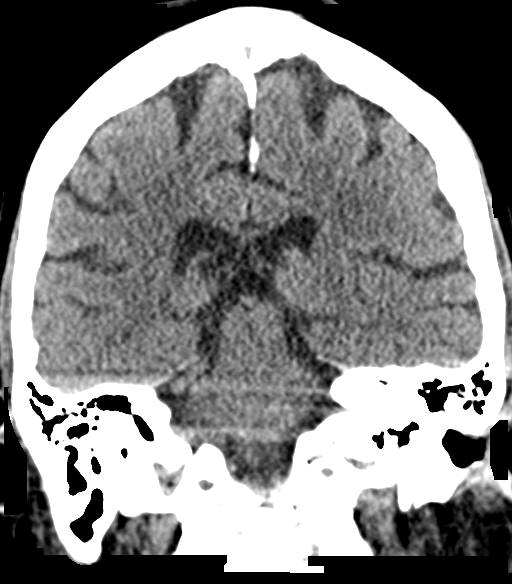
[im 36/65  brain]
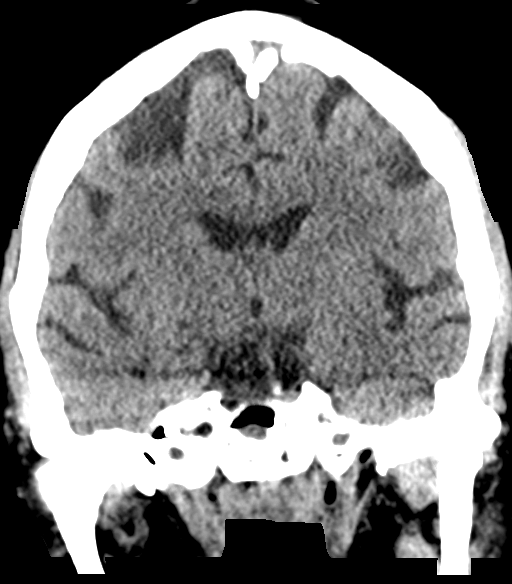

[Series 5: sagittal soft tissue · sagittal · 0.30mm/px · 3 of 46 slices shown]
[im 16/46  brain]
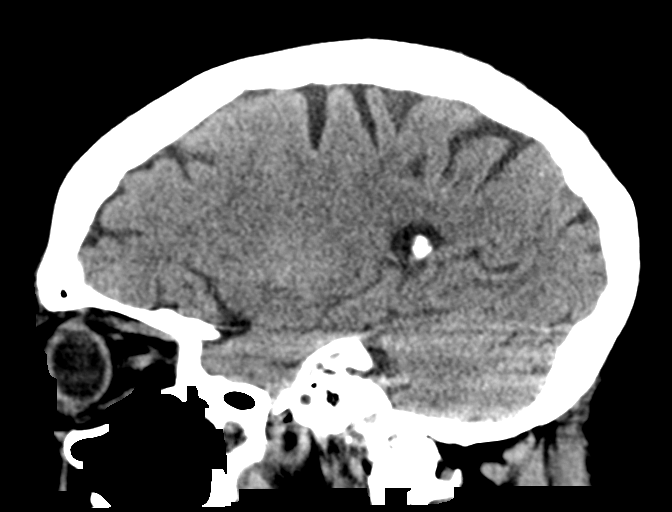
[im 23/46  brain]
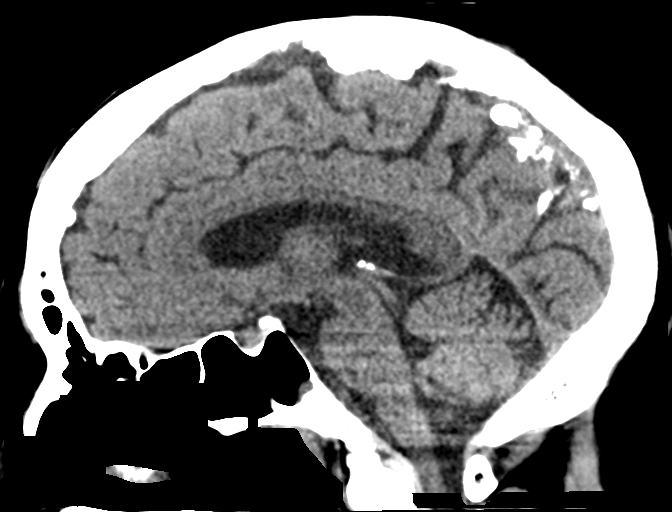
[im 31/46  brain]
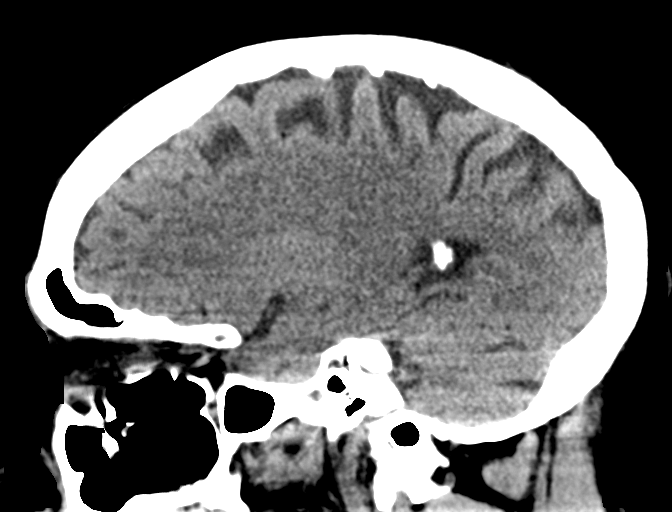

[15 of 47 positions shown; findings below may reference images not displayed]

FINDINGS: Brain: Mild age related involutional changes of the brain. Minimal
small vessel ischemic disease of periventricular white matter. No
large vascular territory infarct hemorrhage or midline shift. No
intra-axial mass nor extra-axial fluid collections.

Vascular: No hyperdense vessel sign.

Skull: No skull fracture.

Sinuses/Orbits: Clear paranasal sinuses and mastoids. Intact orbits
and globes.

Other: None
IMPRESSION: Chronic minimal small vessel ischemic disease of periventricular
white matter. No acute intracranial abnormality.

## 2018-11-03 ENCOUNTER — Encounter: Payer: Self-pay | Admitting: Infectious Diseases

## 2018-11-05 ENCOUNTER — Telehealth: Payer: Self-pay

## 2018-11-05 NOTE — Telephone Encounter (Signed)
Patient information came up on pap list. Attempted to call patient to schedule a pap smear with Janene Madeira, Np if she does not see anyone regularly for annual paps. Patient's sister answered my call states patient passed away in 05/16/18 from cancer. Gave patient's sister our condolences. Will route message to DR. Tommy Medal to inform him of patients passing.  [place]
